# Patient Record
Sex: Female | Born: 1960
Health system: Southern US, Community
[De-identification: ages and names within clinical notes are randomized; demographics above are authoritative.]

## PROBLEM LIST (undated history)

## (undated) ENCOUNTER — Emergency Department (HOSPITAL_COMMUNITY): Payer: BC Managed Care – PPO | Source: Home / Self Care

## (undated) ENCOUNTER — Ambulatory Visit

## (undated) DIAGNOSIS — M797 Fibromyalgia: Secondary | ICD-10-CM

## (undated) DIAGNOSIS — M25569 Pain in unspecified knee: Secondary | ICD-10-CM

## (undated) DIAGNOSIS — K469 Unspecified abdominal hernia without obstruction or gangrene: Secondary | ICD-10-CM

## (undated) DIAGNOSIS — Z95828 Presence of other vascular implants and grafts: Secondary | ICD-10-CM

## (undated) DIAGNOSIS — R5383 Other fatigue: Secondary | ICD-10-CM

## (undated) DIAGNOSIS — K5900 Constipation, unspecified: Secondary | ICD-10-CM

## (undated) DIAGNOSIS — I82409 Acute embolism and thrombosis of unspecified deep veins of unspecified lower extremity: Secondary | ICD-10-CM

## (undated) DIAGNOSIS — R42 Dizziness and giddiness: Secondary | ICD-10-CM

## (undated) DIAGNOSIS — R5381 Other malaise: Secondary | ICD-10-CM

## (undated) DIAGNOSIS — R071 Chest pain on breathing: Secondary | ICD-10-CM

## (undated) DIAGNOSIS — F29 Unspecified psychosis not due to a substance or known physiological condition: Secondary | ICD-10-CM

## (undated) DIAGNOSIS — F411 Generalized anxiety disorder: Secondary | ICD-10-CM

## (undated) DIAGNOSIS — R110 Nausea: Secondary | ICD-10-CM

## (undated) DIAGNOSIS — IMO0002 Reserved for concepts with insufficient information to code with codable children: Secondary | ICD-10-CM

## (undated) DIAGNOSIS — G43019 Migraine without aura, intractable, without status migrainosus: Secondary | ICD-10-CM

## (undated) HISTORY — PX: ENDOMETRIAL ABLATION: SHX621

## (undated) HISTORY — DX: Generalized anxiety disorder: F41.1

## (undated) HISTORY — DX: Presence of other vascular implants and grafts: Z95.828

## (undated) HISTORY — DX: Unspecified abdominal hernia without obstruction or gangrene: K46.9

## (undated) HISTORY — DX: Unspecified psychosis not due to a substance or known physiological condition: F29

## (undated) HISTORY — DX: Pain in unspecified knee: M25.569

## (undated) HISTORY — PX: HERNIA REPAIR: SHX51

## (undated) HISTORY — DX: Reserved for concepts with insufficient information to code with codable children: IMO0002

## (undated) HISTORY — DX: Nausea: R11.0

## (undated) HISTORY — DX: Migraine without aura, intractable, without status migrainosus: G43.019

## (undated) HISTORY — DX: Chest pain on breathing: R07.1

## (undated) HISTORY — DX: Other malaise: R53.81

## (undated) HISTORY — DX: Other fatigue: R53.83

---

## 1999-01-18 ENCOUNTER — Encounter: Payer: Self-pay | Admitting: Family Medicine

## 1999-01-18 ENCOUNTER — Ambulatory Visit (HOSPITAL_COMMUNITY): Admission: RE | Admit: 1999-01-18 | Discharge: 1999-01-18 | Payer: Self-pay | Admitting: Family Medicine

## 1999-02-06 ENCOUNTER — Emergency Department (HOSPITAL_COMMUNITY): Admission: EM | Admit: 1999-02-06 | Discharge: 1999-02-06 | Payer: Self-pay | Admitting: Emergency Medicine

## 2002-06-14 ENCOUNTER — Encounter: Payer: Self-pay | Admitting: Obstetrics & Gynecology

## 2002-06-14 ENCOUNTER — Ambulatory Visit (HOSPITAL_COMMUNITY): Admission: RE | Admit: 2002-06-14 | Discharge: 2002-06-14 | Payer: Self-pay | Admitting: Obstetrics & Gynecology

## 2002-06-30 ENCOUNTER — Ambulatory Visit (HOSPITAL_COMMUNITY): Admission: RE | Admit: 2002-06-30 | Discharge: 2002-06-30 | Payer: Self-pay | Admitting: Obstetrics & Gynecology

## 2002-06-30 ENCOUNTER — Encounter (INDEPENDENT_AMBULATORY_CARE_PROVIDER_SITE_OTHER): Payer: Self-pay

## 2002-06-30 ENCOUNTER — Encounter: Payer: Self-pay | Admitting: Obstetrics & Gynecology

## 2003-03-20 ENCOUNTER — Emergency Department (HOSPITAL_COMMUNITY): Admission: EM | Admit: 2003-03-20 | Discharge: 2003-03-20 | Payer: Self-pay | Admitting: Emergency Medicine

## 2003-06-09 ENCOUNTER — Ambulatory Visit (HOSPITAL_COMMUNITY): Admission: RE | Admit: 2003-06-09 | Discharge: 2003-06-09 | Payer: Self-pay | Admitting: Obstetrics & Gynecology

## 2003-09-17 ENCOUNTER — Emergency Department (HOSPITAL_COMMUNITY): Admission: EM | Admit: 2003-09-17 | Discharge: 2003-09-17 | Payer: Self-pay | Admitting: Emergency Medicine

## 2003-12-07 ENCOUNTER — Emergency Department (HOSPITAL_COMMUNITY): Admission: EM | Admit: 2003-12-07 | Discharge: 2003-12-07 | Payer: Self-pay | Admitting: *Deleted

## 2004-04-22 ENCOUNTER — Ambulatory Visit: Payer: Self-pay | Admitting: Internal Medicine

## 2004-05-03 ENCOUNTER — Ambulatory Visit: Payer: Self-pay | Admitting: Internal Medicine

## 2004-07-17 ENCOUNTER — Other Ambulatory Visit: Admission: RE | Admit: 2004-07-17 | Discharge: 2004-07-17 | Payer: Self-pay | Admitting: Obstetrics and Gynecology

## 2004-08-31 ENCOUNTER — Emergency Department (HOSPITAL_COMMUNITY): Admission: EM | Admit: 2004-08-31 | Discharge: 2004-08-31 | Payer: Self-pay | Admitting: Family Medicine

## 2005-01-06 ENCOUNTER — Emergency Department (HOSPITAL_COMMUNITY): Admission: EM | Admit: 2005-01-06 | Discharge: 2005-01-06 | Payer: Self-pay | Admitting: *Deleted

## 2005-02-14 ENCOUNTER — Ambulatory Visit (HOSPITAL_COMMUNITY): Admission: RE | Admit: 2005-02-14 | Discharge: 2005-02-14 | Payer: Self-pay | Admitting: Obstetrics and Gynecology

## 2005-02-28 ENCOUNTER — Ambulatory Visit: Payer: Self-pay | Admitting: Internal Medicine

## 2005-03-17 ENCOUNTER — Ambulatory Visit: Payer: Self-pay | Admitting: Internal Medicine

## 2005-03-22 ENCOUNTER — Emergency Department (HOSPITAL_COMMUNITY): Admission: EM | Admit: 2005-03-22 | Discharge: 2005-03-22 | Payer: Self-pay | Admitting: Emergency Medicine

## 2005-03-24 ENCOUNTER — Ambulatory Visit: Payer: Self-pay | Admitting: Internal Medicine

## 2005-03-25 ENCOUNTER — Ambulatory Visit: Payer: Self-pay | Admitting: Internal Medicine

## 2005-04-01 ENCOUNTER — Ambulatory Visit: Payer: Self-pay

## 2005-06-10 ENCOUNTER — Emergency Department (HOSPITAL_COMMUNITY): Admission: EM | Admit: 2005-06-10 | Discharge: 2005-06-10 | Payer: Self-pay | Admitting: Emergency Medicine

## 2005-06-29 ENCOUNTER — Emergency Department (HOSPITAL_COMMUNITY): Admission: EM | Admit: 2005-06-29 | Discharge: 2005-06-29 | Payer: Self-pay | Admitting: Family Medicine

## 2005-09-23 ENCOUNTER — Emergency Department (HOSPITAL_COMMUNITY): Admission: EM | Admit: 2005-09-23 | Discharge: 2005-09-23 | Payer: Self-pay | Admitting: Emergency Medicine

## 2006-01-14 IMAGING — CR DG CHEST 1V PORT
1 series · 1 of 1 positions shown · non-contrast
Comparison: None.

CLINICAL DATA: 44-year-old with chest pain and arm pain.
 PORTABLE CHEST ? 1 VIEW:

[view not recorded]
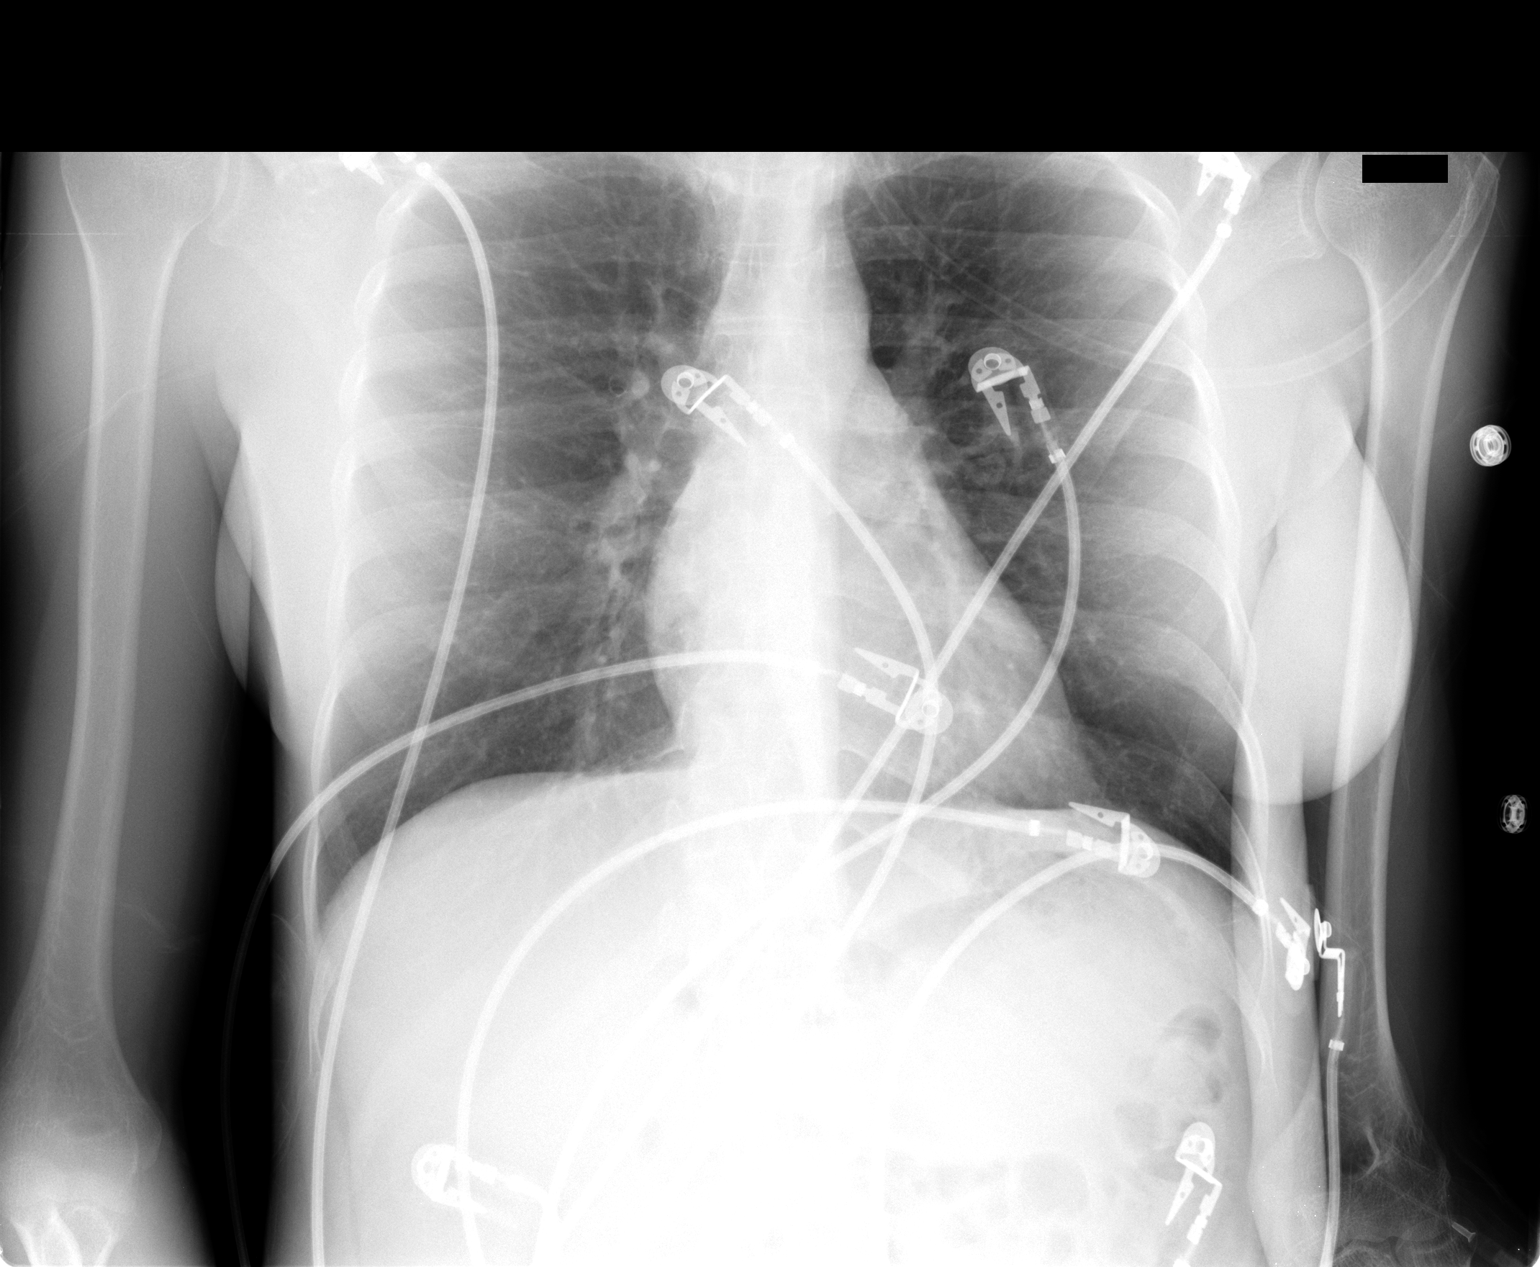

[1 of 1 positions shown; findings below may reference images not displayed]

FINDINGS: The heart size and mediastinal contours are within normal limits.  Both lungs are clear.  Note is made of an inferior vena cava filter overlying the upper abdomen.
IMPRESSION: No acute disease.

## 2006-03-31 ENCOUNTER — Ambulatory Visit: Payer: Self-pay | Admitting: Internal Medicine

## 2006-04-20 ENCOUNTER — Emergency Department (HOSPITAL_COMMUNITY): Admission: EM | Admit: 2006-04-20 | Discharge: 2006-04-20 | Payer: Self-pay | Admitting: Emergency Medicine

## 2006-04-20 ENCOUNTER — Ambulatory Visit: Payer: Self-pay | Admitting: Internal Medicine

## 2006-07-25 ENCOUNTER — Emergency Department (HOSPITAL_COMMUNITY): Admission: EM | Admit: 2006-07-25 | Discharge: 2006-07-25 | Payer: Self-pay | Admitting: *Deleted

## 2006-07-25 ENCOUNTER — Ambulatory Visit: Payer: Self-pay | Admitting: Family Medicine

## 2007-05-24 ENCOUNTER — Emergency Department (HOSPITAL_COMMUNITY): Admission: EM | Admit: 2007-05-24 | Discharge: 2007-05-24 | Payer: Self-pay | Admitting: Family Medicine

## 2007-12-02 ENCOUNTER — Encounter: Payer: Self-pay | Admitting: Internal Medicine

## 2007-12-07 ENCOUNTER — Emergency Department (HOSPITAL_COMMUNITY): Admission: EM | Admit: 2007-12-07 | Discharge: 2007-12-07 | Payer: Self-pay | Admitting: Emergency Medicine

## 2008-04-28 ENCOUNTER — Emergency Department (HOSPITAL_COMMUNITY): Admission: EM | Admit: 2008-04-28 | Discharge: 2008-04-28 | Payer: Self-pay | Admitting: Internal Medicine

## 2009-03-28 ENCOUNTER — Ambulatory Visit: Payer: Self-pay | Admitting: Internal Medicine

## 2009-03-28 DIAGNOSIS — R5381 Other malaise: Secondary | ICD-10-CM

## 2009-03-28 DIAGNOSIS — R5383 Other fatigue: Secondary | ICD-10-CM

## 2009-03-28 DIAGNOSIS — G43019 Migraine without aura, intractable, without status migrainosus: Secondary | ICD-10-CM

## 2009-03-28 DIAGNOSIS — R110 Nausea: Secondary | ICD-10-CM | POA: Insufficient documentation

## 2009-03-28 HISTORY — DX: Other malaise: R53.81

## 2009-03-28 HISTORY — DX: Migraine without aura, intractable, without status migrainosus: G43.019

## 2009-03-28 HISTORY — DX: Other fatigue: R53.83

## 2009-03-28 HISTORY — DX: Nausea: R11.0

## 2009-03-29 ENCOUNTER — Encounter: Admission: RE | Admit: 2009-03-29 | Discharge: 2009-03-29 | Payer: Self-pay | Admitting: Internal Medicine

## 2009-04-02 LAB — CONVERTED CEMR LAB
ALT: 93 units/L — ABNORMAL HIGH (ref 0–35)
AST: 41 units/L — ABNORMAL HIGH (ref 0–37)
Albumin: 4.6 g/dL (ref 3.5–5.2)
Basophils Absolute: 0.1 10*3/uL (ref 0.0–0.1)
Bilirubin Urine: NEGATIVE
CO2: 24 meq/L (ref 19–32)
Chloride: 106 meq/L (ref 96–112)
GFR calc non Af Amer: 137.92 mL/min (ref >60)
Glucose, Bld: 85 mg/dL (ref 70–99)
HCT: 39.6 % (ref 36.0–46.0)
Hemoglobin: 13.5 g/dL (ref 12.0–15.0)
Ketones, ur: NEGATIVE mg/dL
Lymphs Abs: 1.7 10*3/uL (ref 0.7–4.0)
Monocytes Relative: 6.8 % (ref 3.0–12.0)
Neutro Abs: 2 10*3/uL (ref 1.4–7.7)
Potassium: 4.2 meq/L (ref 3.5–5.1)
RDW: 12.9 % (ref 11.5–14.6)
Sed Rate: 13 mm/hr (ref 0–22)
Sodium: 146 meq/L — ABNORMAL HIGH (ref 135–145)
Urine Glucose: NEGATIVE mg/dL
Urobilinogen, UA: 0.2 (ref 0.0–1.0)

## 2009-04-13 ENCOUNTER — Ambulatory Visit: Payer: Self-pay | Admitting: Internal Medicine

## 2009-08-06 ENCOUNTER — Emergency Department (HOSPITAL_COMMUNITY): Admission: EM | Admit: 2009-08-06 | Discharge: 2009-08-06 | Payer: Self-pay | Admitting: Emergency Medicine

## 2009-08-17 ENCOUNTER — Encounter: Admission: RE | Admit: 2009-08-17 | Discharge: 2009-08-17 | Payer: Self-pay | Admitting: Orthopedic Surgery

## 2009-11-16 ENCOUNTER — Ambulatory Visit: Payer: Self-pay | Admitting: Internal Medicine

## 2009-11-16 ENCOUNTER — Telehealth: Payer: Self-pay | Admitting: Internal Medicine

## 2009-11-16 DIAGNOSIS — F29 Unspecified psychosis not due to a substance or known physiological condition: Secondary | ICD-10-CM | POA: Insufficient documentation

## 2009-11-16 DIAGNOSIS — F411 Generalized anxiety disorder: Secondary | ICD-10-CM

## 2009-11-16 HISTORY — DX: Generalized anxiety disorder: F41.1

## 2009-11-16 HISTORY — DX: Unspecified psychosis not due to a substance or known physiological condition: F29

## 2009-11-16 LAB — CONVERTED CEMR LAB
Amphetamine Screen, Ur: NEGATIVE
Barbiturate Quant, Ur: NEGATIVE
Benzodiazepines.: NEGATIVE
Creatinine,U: 57.3 mg/dL
Methadone: NEGATIVE
Propoxyphene: NEGATIVE

## 2009-11-19 LAB — CONVERTED CEMR LAB
ALT: 23 units/L (ref 0–35)
BUN: 16 mg/dL (ref 6–23)
Basophils Relative: 0.6 % (ref 0.0–3.0)
CO2: 28 meq/L (ref 19–32)
Chloride: 111 meq/L (ref 96–112)
Creatinine, Ser: 0.7 mg/dL (ref 0.4–1.2)
Eosinophils Absolute: 0 10*3/uL (ref 0.0–0.7)
Eosinophils Relative: 0.7 % (ref 0.0–5.0)
Glucose, Bld: 94 mg/dL (ref 70–99)
Hemoglobin, Urine: NEGATIVE
Leukocytes, UA: NEGATIVE
Lymphocytes Relative: 35.7 % (ref 12.0–46.0)
MCHC: 33.9 g/dL (ref 30.0–36.0)
Neutrophils Relative %: 55.5 % (ref 43.0–77.0)
Nitrite: NEGATIVE
Platelets: 290 10*3/uL (ref 150.0–400.0)
RBC: 4.49 M/uL (ref 3.87–5.11)
TSH: 0.85 microintl units/mL (ref 0.35–5.50)
Total Protein, Urine: NEGATIVE mg/dL
Total Protein: 7.6 g/dL (ref 6.0–8.3)
WBC: 5.2 10*3/uL (ref 4.5–10.5)

## 2009-12-03 ENCOUNTER — Ambulatory Visit: Payer: Self-pay | Admitting: Internal Medicine

## 2010-02-04 ENCOUNTER — Ambulatory Visit: Payer: Self-pay | Admitting: Internal Medicine

## 2010-02-04 ENCOUNTER — Telehealth: Payer: Self-pay | Admitting: Internal Medicine

## 2010-02-04 DIAGNOSIS — M25569 Pain in unspecified knee: Secondary | ICD-10-CM

## 2010-02-04 HISTORY — DX: Pain in unspecified knee: M25.569

## 2010-04-12 ENCOUNTER — Telehealth: Payer: Self-pay | Admitting: Internal Medicine

## 2010-04-16 ENCOUNTER — Encounter: Payer: Self-pay | Admitting: Internal Medicine

## 2010-04-16 ENCOUNTER — Ambulatory Visit: Payer: Self-pay | Admitting: Internal Medicine

## 2010-04-16 DIAGNOSIS — R071 Chest pain on breathing: Secondary | ICD-10-CM | POA: Insufficient documentation

## 2010-04-16 HISTORY — DX: Chest pain on breathing: R07.1

## 2010-06-11 NOTE — Progress Notes (Signed)
Summary: Med SE?  Phone Note Call from Patient Call back at Home Phone [phone]   Caller: Mom Summary of Call: Pt's mother called stating that pt had a severe HA yesterday evening. Pt took someone's migraine medication (mother is unsure of medication) and became very confused and disoriented. Per Mom, pt drove around last night for 3 hours trying to get home. Mom states that this morning pt is still disoriented, not knowing where she is. Pt's mother was advised that [facility] did not have an appt available until this afternoon and there were no other available appointment with any other MD. Pt was scheduled for 4p today but after declining to take pt to ER or UC, pt's mother is requesting work-in appt.  After speaking with [facility], I was advised that pt can be worked in this morning but she may have a wait. Pt's appointment was moved to 11:15a per AVP and Sarah. I spoke with pt and advised that she may have a wait which she acknowledged. Initial call taken by: Margaret Pyle, CMA,  November 16, 2009 9:22 AM

## 2010-06-11 NOTE — Assessment & Plan Note (Signed)
Summary: KNEE SWELLING/ HEADACHES/NWS  #   Vital Signs:  Patient profile:   50 year old female Height:      62 inches Weight:      122 pounds BMI:     22.39 Temp:     97.8 degrees F oral Pulse rate:   76 / minute Pulse rhythm:   regular Resp:     16 per minute BP sitting:   104 / 60  (left arm) Cuff size:   regular  Vitals Entered By: Lanier Prude, Beverly Gust) (February 04, 2010 9:41 AM)  Procedure Note  Injections: The patient complains of pain and swelling. Duration of symptoms: 5 days Indication: acute pain Consent signed: yes  Procedure # 1: joint aspiration & injection    Region: anterior    Location: L knee    Technique: 18 g needle    Medication: 20 mg depomedrol    Anesthesia: 1.0 ml 1% lidocaine w/o epinephrine    Comment: Risks including but not limited by incomplete procedure, bleeding, infection, recurrence were discussed with the patient. Consent form was signed. 9 cc of sero-sanduinous fluid aspirated.  Tolerated well. Complicatons - none. Good pain relief following the procedure.   Cleaned and prepped with: alcohol and betadine Wound dressing: bandaid, bulky gauze dressing, and ace wrap Instructions: elevate  CC: migraines, Lt knee pain Is Patient Diabetic? No Comments pt states she injured knee when she slipped on a wheelchair ramp and fell.  Swelling has reduced but she states it feels "like it has fluid on it"   CC:  migraines and Lt knee pain.  History of Present Illness: C/o HAs C/o L knee pain and swelling after a fall 5 d ago  Current Medications (verified): 1)  Promethazine Hcl 25 Mg  Tabs (Promethazine Hcl) .Marland Kitchen.. 1 By Mouth Qid As Needed Nausea 2)  Ibuprofen 600 Mg  Tabs (Ibuprofen) .Marland Kitchen.. 1 By Mouth Two Times A Day As Needed Pain 3)  Prednisone 10 Mg Tabs (Prednisone) .... 4 Tabs On D#1, 2 Tabs On D#2 and 1 Tab On D#3 As Needed Bad Migraine 4)  Vitamin D3 1000 Unit  Tabs (Cholecalciferol) .Marland Kitchen.. 1 By Mouth Daily 5)  Hydrocodone-Acetaminophen  5-325 Mg Tabs (Hydrocodone-Acetaminophen) .Marland Kitchen.. 1 or 1/2  By Mouth Up To 4 Times Per Day As Needed For Pain 6)  Lorazepam 0.5 Mg Tabs (Lorazepam) .Marland Kitchen.. 1 By Mouth Two Times A Day As Needed Anxiety 7)  Zomig Zmt 5 Mg Tbdp (Zolmitriptan) .Marland Kitchen.. 1 By Mouth Once Daily As Needed Migraine  Allergies (verified): 1)  Topamax (Topiramate)  Past History:  Past Medical History: Last updated: 03/28/2009 Migraines Hernia Pregnancy phlebitis   Social History: Last updated: 03/28/2009 Occupation: unempl saleseperson Divorced Never Smoked Alcohol use-no Drug use-no Regular exercise-no  Review of Systems       The patient complains of difficulty walking.  The patient denies fever, dyspnea on exertion, abdominal pain, and depression.    Physical Exam  General:  NAD Mouth:  Oral mucosa and oropharynx without lesions or exudates.  Teeth in good repair. Lungs:  Normal respiratory effort, chest expands symmetrically. Lungs are clear to auscultation, no crackles or wheezes. Heart:  Normal rate and regular rhythm. S1 and S2 normal without gallop, murmur, click, rub or other extra sounds. Abdomen:  Bowel sounds positive,abdomen soft and non-tender without masses, organomegaly or hernias noted. Msk:  L knee with 3x3x1 cm swelling over knee cap Neurologic:  No cranial nerve deficits noted. Station and gait are normal. Plantar  reflexes are down-going bilaterally. DTRs are symmetrical throughout. Sensory nl, motor and coordinative functions appear a little off. Skin:  Intact without suspicious lesions or rashes Psych:  Oriented X2.   Slurred speech a little   Impression & Recommendations:  Problem # 1:  KNEE PAIN (ICD-719.46) L suprapatella bursitis, posttraumatic Assessment New  Her updated medication list for this problem includes:    Ibuprofen 600 Mg Tabs (Ibuprofen) .Marland Kitchen... 1 by mouth two times a day as needed pain    Hydrocodone-acetaminophen 5-325 Mg Tabs (Hydrocodone-acetaminophen) .Marland Kitchen... 1 or 1/2   by mouth up to 4 times per day as needed for pain     Orders: Ace Wraps 3-5 in/yard  (U0454) Joint Aspirate / Injection, Large (20610) Depo-Medrol 20mg  (J1020)  Problem # 2:  MIGRAINE, COMMON W/INTRACTABLE MIGRAINE (ICD-346.11) Assessment: Unchanged  Her updated medication list for this problem includes:    Ibuprofen 600 Mg Tabs (Ibuprofen) .Marland Kitchen... 1 by mouth two times a day as needed pain    Hydrocodone-acetaminophen 5-325 Mg Tabs (Hydrocodone-acetaminophen) .Marland Kitchen... 1 or 1/2  by mouth up to 4 times per day as needed for pain    Zomig Zmt 5 Mg Tbdp (Zolmitriptan) .Marland Kitchen... 1 by mouth once daily as needed migraine    Fioricet 50-325-40 Mg Tabs (Butalbital-apap-caffeine) .Marland Kitchen... 1-2 by mouth two times a day as needed migraine or tension ha  Complete Medication List: 1)  Promethazine Hcl 25 Mg Tabs (Promethazine hcl) .Marland Kitchen.. 1 by mouth qid as needed nausea 2)  Ibuprofen 600 Mg Tabs (Ibuprofen) .Marland Kitchen.. 1 by mouth two times a day as needed pain 3)  Prednisone 10 Mg Tabs (Prednisone) .... 4 tabs on d#1, 2 tabs on d#2 and 1 tab on d#3 as needed bad migraine 4)  Vitamin D3 1000 Unit Tabs (Cholecalciferol) .Marland Kitchen.. 1 by mouth daily 5)  Hydrocodone-acetaminophen 5-325 Mg Tabs (Hydrocodone-acetaminophen) .Marland Kitchen.. 1 or 1/2  by mouth up to 4 times per day as needed for pain 6)  Lorazepam 0.5 Mg Tabs (Lorazepam) .Marland Kitchen.. 1 by mouth two times a day as needed anxiety 7)  Zomig Zmt 5 Mg Tbdp (Zolmitriptan) .Marland Kitchen.. 1 by mouth once daily as needed migraine 8)  Fioricet 50-325-40 Mg Tabs (Butalbital-apap-caffeine) .Marland Kitchen.. 1-2 by mouth two times a day as needed migraine or tension ha   Patient Instructions: 1)  Please schedule a follow-up appointment in 1 month. Prescriptions: FIORICET 50-325-40 MG TABS (BUTALBITAL-APAP-CAFFEINE) 1-2 by mouth two times a day as needed migraine or tension HA  #60 x 1   Entered and Authorized by:   Tresa Garter MD   Signed by:   Tresa Garter MD on 02/04/2010   Method used:   Print then Give  to Patient   RxID:   [id number]

## 2010-06-11 NOTE — Miscellaneous (Signed)
Summary: Special Procedure  Special Procedure   Imported By: Lester  02/06/2010 08:59:12  _____________________________________________________________________  External Attachment:    Type:   Image     Comment:   External Document

## 2010-06-11 NOTE — Assessment & Plan Note (Signed)
Summary: LITTLE DISILLUSIONAL--HEADACHE--STC   Vital Signs:  Patient profile:   50 year old female Height:      62 inches (157.48 cm) Weight:      120 pounds (54.55 kg) BMI:     22.03 Temp:     97.9 degrees F (36.61 degrees C) oral Pulse rate:   76 / minute Pulse rhythm:   regular Resp:     16 per minute BP sitting:   100 / 70  (left arm) Cuff size:   regular  Vitals Entered By: Lanier Prude, CMA(AAMA) (November 16, 2009 11:18 AM) CC: Dizziness, confusion Is Patient Diabetic? No Comments pt had Migraine HA yest. took Topamax at 7pm. Symptoms began then. HA improved today   CC:  Dizziness and confusion.  History of Present Illness: C/o bad migraine HA x 1 wk; resolved  She took Topamax yesturday at lunch (someone gave it to her) - in 30 min developed dizziness, slurred speech, confused; she was lost in town - it took her 6 hrs to get home. Police pulled her over. C/o being jittery and tearful this am. No HA today...  Current Medications (verified): 1)  Maxalt-Mlt 10 Mg Tbdp (Rizatriptan Benzoate) .Marland Kitchen.. 1 By Mouth Once Daily As Needed Headache 2)  Promethazine Hcl 25 Mg  Tabs (Promethazine Hcl) .Marland Kitchen.. 1 By Mouth Qid As Needed Nausea 3)  Ibuprofen 600 Mg  Tabs (Ibuprofen) .Marland Kitchen.. 1 By Mouth Two Times A Day As Needed Pain 4)  Prednisone 10 Mg Tabs (Prednisone) .... 4 Tabs On D#1, 2 Tabs On D#2 and 1 Tab On D#3 As Needed Bad Migraine 5)  Vitamin D3 1000 Unit  Tabs (Cholecalciferol) .Marland Kitchen.. 1 By Mouth Daily 6)  Hydrocodone-Acetaminophen 5-325 Mg Tabs (Hydrocodone-Acetaminophen) .Marland Kitchen.. 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  The patient denies fever, weight loss, dyspnea on exertion, prolonged cough, and abdominal pain.    Physical Exam  General:  NAD Eyes:  No corneal or conjunctival inflammation noted. EOMI. Perrla.  Nose:  External nasal examination shows no deformity or inflammation. Nasal mucosa are pink and moist without lesions or exudates. Neck:  No deformities, masses, or tenderness noted. Lungs:  Normal respiratory effort, chest expands symmetrically. Lungs are clear to auscultation, no crackles or wheezes. Heart:  Normal rate and regular rhythm. S1 and S2 normal without gallop, murmur, click, rub or other extra sounds. Abdomen:  Bowel sounds positive,abdomen soft and non-tender without masses, organomegaly or hernias noted. Msk:  WNL Neurologic:  No cranial nerve deficits noted. Station and gait are normal. Plantar reflexes are down-going bilaterally. DTRs are symmetrical throughout. Sensory nl, motor and coordinative functions appear a little off. Skin:  Intact without suspicious lesions or rashes Psych:  Oriented X2.   Slurred speech a little   Impression & Recommendations:  Problem # 1:  MENTAL CONFUSION (ICD-298.9) due to injestion of  100 mg Topamax supposedly vs other Assessment New Discussed the issueu -  she should never do it again. Th effects of the drug should wear out: she is feeling better. Drink more fluids. Rest. Mom will stay w/her. No driving untill well... Orders: TLB-B12, Serum-Total ONLY (16109-U04) TLB-BMP (Basic Metabolic Panel-BMET) (80048-METABOL) TLB-Hepatic/Liver Function Pnl (80076-HEPATIC) TLB-CBC Platelet - w/Differential (85025-CBCD) TLB-TSH (Thyroid Stimulating Hormone) (84443-TSH) TLB-Udip  ONLY (81003-UDIP) T-Drug Screen-Urine, (single) [phone])  Problem # 2:  NAUSEA ALONE (ICD-787.02) Assessment: New  Orders: TLB-B12, Serum-Total ONLY (78295-A21) TLB-BMP (Basic Metabolic Panel-BMET) (80048-METABOL) TLB-Hepatic/Liver Function Pnl (80076-HEPATIC) TLB-CBC Platelet - w/Differential (85025-CBCD) TLB-TSH (Thyroid Stimulating Hormone) (84443-TSH) TLB-Udip ONLY (81003-UDIP) T-Drug Screen-Urine, (single) (30865-78469)  Problem # 3:  FATIGUE (ICD-780.79) Assessment: New  Orders: TLB-B12, Serum-Total ONLY (62952-W41) TLB-BMP (Basic Metabolic Panel-BMET) (80048-METABOL) TLB-Hepatic/Liver Function Pnl (80076-HEPATIC) TLB-CBC Platelet - w/Differential (85025-CBCD) TLB-TSH (Thyroid Stimulating Hormone) (84443-TSH) TLB-Udip ONLY (81003-UDIP) T-Drug Screen-Urine, (single) (32440-10272)  Problem # 4:  MIGRAINE, COMMON W/INTRACTABLE MIGRAINE (ICD-346.11) Assessment: Deteriorated For future bad migraines: The following medications were removed from the medication list:    Maxalt-mlt 10 Mg Tbdp (Rizatriptan benzoate) .Marland Kitchen... 1 by mouth once daily as needed headache Her updated medication list for this problem includes:    Ibuprofen 600 Mg Tabs (Ibuprofen) .Marland Kitchen... 1 by mouth two times a day as needed pain    Hydrocodone-acetaminophen 5-325 Mg Tabs (Hydrocodone-acetaminophen) .Marland Kitchen... 1 or 1/2  by mouth up to 4 times per day as needed for pain    Zomig Zmt 5 Mg Tbdp (Zolmitriptan) .Marland Kitchen... 1 by mouth once daily as needed migraine  Problem # 5:  ANXIETY (ICD-300.00) due to #1 Assessment: New  Her updated medication list for this problem includes:    Lorazepam 0.5 Mg Tabs (Lorazepam) .Marland Kitchen... 1 by mouth two times a day as needed anxiety if bad  Complete Medication List: 1)  Promethazine Hcl 25 Mg Tabs (Promethazine hcl) .Marland Kitchen.. 1 by mouth qid as needed nausea 2)  Ibuprofen 600 Mg Tabs (Ibuprofen) .Marland Kitchen.. 1 by mouth two times a day as needed pain 3)  Prednisone 10 Mg Tabs (Prednisone) .... 4  tabs on d#1, 2 tabs on d#2 and 1 tab on d#3 as needed bad migraine 4)  Vitamin D3 1000 Unit Tabs (Cholecalciferol) .Marland Kitchen.. 1 by mouth daily 5)  Hydrocodone-acetaminophen 5-325 Mg Tabs (Hydrocodone-acetaminophen) .Marland Kitchen.. 1 or 1/2  by mouth up to 4 times per day as needed for pain 6)  Lorazepam 0.5 Mg Tabs (Lorazepam) .Marland Kitchen.. 1 by mouth two times a day as needed anxiety 7)  Zomig Zmt 5 Mg Tbdp (Zolmitriptan) .Marland Kitchen.. 1 by mouth once daily as needed migraine  Patient Instructions: 1)  Please schedule a follow-up appointment in 2 weeks. 2)  Call if you are not better in a reasonable amount of time or if worse. Go to ER if feeling really bad!  Prescriptions: ZOMIG ZMT 5 MG TBDP (ZOLMITRIPTAN) 1 by mouth once daily as needed migraine  #12 x 6   Entered and Authorized by:   Tresa Garter MD   Signed by:   Tresa Garter MD on 11/16/2009   Method used:   Print then Give to Patient   RxID:   [id number] HYDROCODONE-ACETAMINOPHEN 5-325 MG TABS (HYDROCODONE-ACETAMINOPHEN) 1 or 1/2  by mouth up to 4 times per day as needed for pain  #30 x 0   Entered and Authorized by:   Tresa Garter MD   Signed by:   Tresa Garter MD on 11/16/2009   Method used:   Print then Give to Patient   RxID:  [id number] PREDNISONE 10 MG TABS (PREDNISONE) 4 tabs on D#1, 2 tabs on D#2 and 1 tab on D#3 as needed bad migraine  #35 x 0   Entered and Authorized by:   Tresa Garter MD   Signed by:   Tresa Garter MD on 11/16/2009   Method used:   Print then Give to Patient   RxID:   [id number] IBUPROFEN 600 MG  TABS (IBUPROFEN) 1 by mouth two times a day as needed pain  #60 x 3   Entered and Authorized by:   Tresa Garter MD   Signed by:   Tresa Garter MD on 11/16/2009   Method used:   Print then Give to Patient   RxID:   [id number] PROMETHAZINE HCL 25 MG  TABS (PROMETHAZINE HCL) 1 by mouth qid as needed nausea  #60 x 3   Entered and Authorized by:   Tresa Garter  MD   Signed by:   Tresa Garter MD on 11/16/2009   Method used:   Print then Give to Patient   RxID:   [id number] LORAZEPAM 0.5 MG TABS (LORAZEPAM) 1 by mouth two times a day as needed anxiety  #20 x 3   Entered and Authorized by:   Tresa Garter MD   Signed by:   Tresa Garter MD on 11/16/2009   Method used:   Print then Give to Patient   RxID:   [id number]

## 2010-06-11 NOTE — Assessment & Plan Note (Signed)
Summary: FU [facility]  #   Vital Signs:  Patient profile:   50 year old female Height:      62 inches Weight:      125 pounds BMI:     22.95 Temp:     98.8 degrees F oral Pulse rate:   88 / minute Pulse rhythm:   regular Resp:     16 per minute BP sitting:   100 / 62  (left arm) Cuff size:   regular  Vitals Entered By: Lanier Prude, Beverly Gust) (April 16, 2010 2:39 PM) CC: cyst on center of chest X 3 days and HA Is Patient Diabetic? No   CC:  cyst on center of chest X 3 days and HA.  History of Present Illness: C/o painful cyst on chest - on the breast bone  tip  x 1 wk  Current Medications (verified): 1)  Promethazine Hcl 25 Mg  Tabs (Promethazine Hcl) .Marland Kitchen.. 1 By Mouth Qid As Needed Nausea 2)  Ibuprofen 600 Mg  Tabs (Ibuprofen) .Marland Kitchen.. 1 By Mouth Two Times A Day As Needed Pain 3)  Prednisone 10 Mg Tabs (Prednisone) .... 4 Tabs On D#1, 2 Tabs On D#2 and 1 Tab On D#3 As Needed Bad Migraine 4)  Vitamin D3 1000 Unit  Tabs (Cholecalciferol) .Marland Kitchen.. 1 By Mouth Daily 5)  Hydrocodone-Acetaminophen 5-325 Mg Tabs (Hydrocodone-Acetaminophen) .Marland Kitchen.. 1 or 1/2  By Mouth Up To 4 Times Per Day As Needed For Pain 6)  Lorazepam 0.5 Mg Tabs (Lorazepam) .Marland Kitchen.. 1 By Mouth Two Times A Day As Needed Anxiety 7)  Zomig Zmt 5 Mg Tbdp (Zolmitriptan) .Marland Kitchen.. 1 By Mouth Once Daily As Needed Migraine 8)  Fioricet 50-325-40 Mg Tabs (Butalbital-Apap-Caffeine) .Marland Kitchen.. 1-2 By Mouth Two Times A Day As Needed Migraine or Tension Ha  Allergies (verified): 1)  Topamax (Topiramate)  Physical Exam  General:  NAD Mouth:  Oral mucosa and oropharynx without lesions or exudates.  Teeth in good repair. Neck:  No deformities, masses, or tenderness noted. Chest Wall:  Sternal tip is tender US exam is WNL Lungs:  Normal respiratory effort, chest expands symmetrically. Lungs are clear to auscultation, no crackles or wheezes. Heart:  Normal rate and regular rhythm. S1 and S2 normal without gallop, murmur, click, rub or other extra  sounds.   Impression & Recommendations:  Problem # 1:  CHEST WALL PAIN, ACUTE (ZOX-096.04) Assessment New MSK [place] does not reveal any focal abnormality. She has a 30 lbs 2 yo baby - straihed anter chest wall The office visit took longer than 20 min with US exam for more than 50% of the 20 min   The following medications were removed from the medication list:    Ibuprofen 600 Mg Tabs (Ibuprofen) .Marland Kitchen... 1 by mouth two times a day as needed pain Her updated medication list for this problem includes:    Vimovo 500-20 Mg Tbec (Naproxen-esomeprazole) .Marland Kitchen... 1 by mouth once daily - two times a day pc as needed pain  Problem # 2:  ANXIETY (ICD-300.00) Assessment: Unchanged  Her updated medication list for this problem includes:    Lorazepam 0.5 Mg Tabs (Lorazepam) .Marland Kitchen... 1 by mouth two times a day as needed anxiety  Complete Medication List: 1)  Promethazine Hcl 25 Mg Tabs (Promethazine hcl) .Marland Kitchen.. 1 by mouth qid as needed nausea 2)  Prednisone 10 Mg Tabs (Prednisone) .... 4 tabs on d#1, 2 tabs on d#2 and 1 tab on d#3 as needed bad migraine 3)  Vitamin D3 1000 Unit  Tabs (Cholecalciferol) .Marland Kitchen.. 1 by mouth daily 4)  Hydrocodone-acetaminophen 5-325 Mg Tabs (Hydrocodone-acetaminophen) .Marland Kitchen.. 1 or 1/2  by mouth up to 4 times per day as needed for pain 5)  Lorazepam 0.5 Mg Tabs (Lorazepam) .Marland Kitchen.. 1 by mouth two times a day as needed anxiety 6)  Zomig Zmt 5 Mg Tbdp (Zolmitriptan) .Marland Kitchen.. 1 by mouth once daily as needed migraine 7)  Vimovo 500-20 Mg Tbec (Naproxen-esomeprazole) .Marland Kitchen.. 1 by mouth once daily - two times a day pc as needed pain  Patient Instructions: 1)  Call if you are not better in a reasonable amount of time or if worse.  Prescriptions: VIMOVO 500-20 MG TBEC (NAPROXEN-ESOMEPRAZOLE) 1 by mouth once daily - two times a day pc as needed pain  #60 x 3   Entered and Authorized by:   Tresa Garter MD   Signed by:   Tresa Garter MD on 04/16/2010   Method used:   Print then Give to Patient    RxID:   [id number]    Orders Added: 1)  Est. Patient Level IV [14782]

## 2010-06-11 NOTE — Progress Notes (Signed)
Summary: Knot  Phone Note Call from Patient Call back at [facility] Phone [phone]   Summary of Call: Pt left vm that she found a knot on her chest and wants MD to be aware. Attempted to call pt, # rings then hangs up x 2 - will try later.  Initial call taken by: Lamar Sprinkles, CMA,  April 12, 2010 3:14 PM  Follow-up for Phone Call        Spoke w/pt, she has noticed a knot today on her chest. No pain, swelling or drainage. Also c/o some tightness in the area. Advised eval asap for severe symptoms and also scheduled pt for office visit sat Follow-up by: Lamar Sprinkles, CMA,  April 12, 2010 5:15 PM  Additional Follow-up for Phone Call Additional follow up Details #1::        agree w/ov Thank you!  Additional Follow-up by: Tresa Garter MD,  April 12, 2010 5:41 PM

## 2010-06-11 NOTE — Progress Notes (Signed)
Summary: Fiorinal  Phone Note Call from Patient   Summary of Call: Patient is requesting rx for fiorinal NOT fioricet. OK?  Initial call taken by: Lamar Sprinkles, CMA,  February 04, 2010 2:31 PM  Follow-up for Phone Call        Fioricet is easier on the stomach - it has Tylenol instead of aspirin; otherwise it is the same Follow-up by: Tresa Garter MD,  February 04, 2010 5:19 PM  Additional Follow-up for Phone Call Additional follow up Details #1::        left mess to call office back...................Marland KitchenLamar Sprinkles, CMA  February 04, 2010 5:36 PM   left mess to call office back................Marland KitchenLamar Sprinkles, CMA  February 05, 2010 2:07 PM   Pt informed  Additional Follow-up by: Lamar Sprinkles, CMA,  February 05, 2010 2:10 PM

## 2010-07-09 ENCOUNTER — Telehealth: Payer: Self-pay | Admitting: Internal Medicine

## 2010-07-11 ENCOUNTER — Telehealth (INDEPENDENT_AMBULATORY_CARE_PROVIDER_SITE_OTHER): Payer: Self-pay | Admitting: *Deleted

## 2010-07-18 NOTE — Progress Notes (Signed)
  Phone Note Other Incoming   Request: Send information Summary of Call: Request for records received from [facility]. Request forwarded to [employer]. Plot[place]

## 2010-07-18 NOTE — Progress Notes (Signed)
Summary: UC TODAY  Phone Note Call from Patient   Summary of Call: Pt c/o possible "pink eye" - she agreed to go to UC for eval today  Initial call taken by: Lamar Sprinkles, CMA,  July 09, 2010 10:03 AM  Follow-up for Phone Call        Agree Thank you!  Follow-up by: Tresa Garter MD,  July 09, 2010 1:23 PM

## 2010-07-24 ENCOUNTER — Inpatient Hospital Stay (INDEPENDENT_AMBULATORY_CARE_PROVIDER_SITE_OTHER)
Admission: RE | Admit: 2010-07-24 | Discharge: 2010-07-24 | Disposition: A | Discharge status: Home / Self Care | Payer: BC Managed Care – PPO | Source: Ambulatory Visit | Attending: Family Medicine | Admitting: Family Medicine

## 2010-07-24 DIAGNOSIS — J019 Acute sinusitis, unspecified: Secondary | ICD-10-CM

## 2010-09-03 ENCOUNTER — Emergency Department (HOSPITAL_COMMUNITY)
Admission: EM | Admit: 2010-09-03 | Discharge: 2010-09-03 | Disposition: A | Discharge status: Home / Self Care | Payer: BC Managed Care – PPO | Attending: Emergency Medicine | Admitting: Emergency Medicine

## 2010-09-03 DIAGNOSIS — Z86718 Personal history of other venous thrombosis and embolism: Secondary | ICD-10-CM | POA: Insufficient documentation

## 2010-09-03 DIAGNOSIS — G43909 Migraine, unspecified, not intractable, without status migrainosus: Secondary | ICD-10-CM | POA: Insufficient documentation

## 2010-09-03 DIAGNOSIS — Z79899 Other long term (current) drug therapy: Secondary | ICD-10-CM | POA: Insufficient documentation

## 2010-09-03 DIAGNOSIS — R071 Chest pain on breathing: Secondary | ICD-10-CM | POA: Insufficient documentation

## 2010-09-03 LAB — DIFFERENTIAL
Basophils Absolute: 0 10*3/uL (ref 0.0–0.1)
Basophils Relative: 1 % (ref 0–1)
Eosinophils Absolute: 0.1 10*3/uL (ref 0.0–0.7)
Monocytes Relative: 8 % (ref 3–12)
Neutro Abs: 3.8 10*3/uL (ref 1.7–7.7)
Neutrophils Relative %: 59 % (ref 43–77)

## 2010-09-03 LAB — URINALYSIS, ROUTINE W REFLEX MICROSCOPIC
Bilirubin Urine: NEGATIVE
Nitrite: NEGATIVE
Specific Gravity, Urine: 1.018 (ref 1.005–1.030)
Urobilinogen, UA: 0.2 mg/dL (ref 0.0–1.0)
pH: 6 (ref 5.0–8.0)

## 2010-09-03 LAB — CBC
Hemoglobin: 12.9 g/dL (ref 12.0–15.0)
Platelets: 315 10*3/uL (ref 150–400)
RBC: 4.49 MIL/uL (ref 3.87–5.11)
WBC: 6.4 10*3/uL (ref 4.0–10.5)

## 2010-09-03 LAB — POCT PREGNANCY, URINE: Preg Test, Ur: NEGATIVE

## 2010-09-03 LAB — BASIC METABOLIC PANEL
Chloride: 111 mEq/L (ref 96–112)
GFR calc Af Amer: >60 mL/min (ref >60)
GFR calc non Af Amer: >60 mL/min (ref >60)
Potassium: 4.2 mEq/L (ref 3.5–5.1)
Sodium: 141 mEq/L (ref 135–145)

## 2010-09-03 LAB — URINE MICROSCOPIC-ADD ON

## 2010-09-03 LAB — D-DIMER, QUANTITATIVE: D-Dimer, Quant: <0.22 ug/mL-FEU (ref 0.00–0.48)

## 2010-09-24 ENCOUNTER — Telehealth: Payer: Self-pay | Admitting: *Deleted

## 2010-09-24 NOTE — Telephone Encounter (Signed)
Tried to reach Pt to inquire as to frequency of migraines and amt of medication needed per month [insurance quantity limit: 8 tablets/40mg  per 30 days] Pt's phone message states, "number not receiving calls at this time". Spoke w/pharmacist and explained situation and gave authority to run new Rx for Quantity of: 8 tablets

## 2010-09-27 NOTE — Op Note (Signed)
NAME:  Bailey Patterson, Bailey Patterson                        ACCOUNT NO.:  [id number]   MEDICAL RECORD NO.:  [id number]                   PATIENT TYPE:  AMB   LOCATION:  SDC                                  FACILITY:  [facility]   PHYSICIAN:  Roseanna Rainbow, M.D.         DATE OF BIRTH:  12-26-1960   DATE OF PROCEDURE:  06/30/2002  DATE OF DISCHARGE:                                 OPERATIVE REPORT   PREOPERATIVE DIAGNOSES:  1. Abnormal Pap smear.  2. Dysfunctional uterine bleeding.   POSTOPERATIVE DIAGNOSES:  1. Abnormal Pap smear.  2. Dysfunctional uterine bleeding.   PROCEDURE:  1. Colposcopy with cervical biopsies.  2. Endometrial cryo ablation.   SURGEON:  Roseanna Rainbow, M.D.   ANESTHESIA:  Laryngeal mask airway, paracervical block.   COMPLICATIONS:  None.   ESTIMATED BLOOD LOSS:  Less than 50 mL.   FLUIDS:  As per anesthesiology.   URINE OUTPUT:  As per anesthesiology.   FINDINGS:  On colposcopic examination the examination was felt to be  nonsatisfactory.  There was an area of leukoplakia at 7 o'clock and areas  with coarse punctations and mosaicism.   PROCEDURE:  Risks, benefits, indications, and alternatives of the procedure  were reviewed with the patient and informed consent was obtained.  The  patient was taken to the operating room with an IV running.  The patient was  placed in a dorsal lithotomy position.  A colposcopic examination was then  performed with the findings noted above.  Biopsies were taken at 7 and 10  o'clock using the scalpel.  Silver nitrate was then applied to the biopsy  sites and adequate hemostasis was noted.  At this point the vagina and  cervix were prepped with Betadine.  2 mL of 2% lidocaine were applied to the  anterior lip of the cervix.  The single tooth tenaculum was applied to this  location and 4 mL of lidocaine was injected at 4 and 7 o'clock to produce a  paracervical block.  The cervix was pin point and was dilated initially  with  lacrimal dilators and then with a Pratt dilator.  Using ultrasound guidance  the probe for the cryo ablation machinery was then advanced through the  cervix into the right cornu.  A freeze spot technique was then performed.  This was repeated in the left cornu.  The probe was then removed.  There was  minimal bleeding noted  and the tenaculum was removed with a good hemostasis noted.  The patient  tolerated the procedure well.  The patient was taken to the PACU in stable  condition.   PATHOLOGY:  Cervical biopsies and endocervical curettings.                                               Lisa A.  Tamela Oddi, M.D.    Judee Clara  D:  06/30/2002  T:  06/30/2002  Job:  [id number]

## 2010-09-27 NOTE — H&P (Signed)
NAME:  Bailey Patterson, Bailey Patterson                        ACCOUNT NO.:  [id number]   MEDICAL RECORD NO.:  [id number]                   PATIENT TYPE:  OUT   LOCATION:  ULT                                  FACILITY:  [facility]   PHYSICIAN:  Roseanna Rainbow, M.D.         DATE OF BIRTH:  [birth year]   DATE OF ADMISSION:  06/14/2002  DATE OF DISCHARGE:                                HISTORY & PHYSICAL   CHIEF COMPLAINT:  The patient is a 50 year old African-American female with  abnormal uterine bleeding who presents for endometrial cryoablation and  cervical biopsy.   HISTORY OF PRESENT ILLNESS:  The patient gives a several-year history of 15-  day cycles.  No previous workup prior to her initial presentation to this  office in January 2004.  Workup to date has been remarkable for a hemoglobin  of 11.6, normal prolactin, normal TSH, and an ultrasound demonstrated a  uterus that was 11.5 cm in its sagittal diameter with fluid in the  endometrial cavity, a 2.2 cm left paraovarian cyst, normal bilateral  ovaries, and small free fluid.  An endometrial biopsy demonstrated benign  secretory endometrium and a Pap smear is pending from June 06, 2002.   PAST OBSTETRICAL AND GYNECOLOGICAL HISTORY:  She has been pregnancy five  times, has had five live births.  She is heterosexual, six sexual partners  lifetime.  She is status post a tubal ligation.  She also uses condoms.  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   PAST MEDICAL HISTORY:  Remarkable for a history of deep venous thrombosis.   PAST SURGICAL HISTORY:  She has had five cesarean deliveries and she has had  thrombectomies.   ALLERGIES:  No known drug allergies.   SOCIAL HISTORY:  She is divorced.  She denies any tobacco or substance  abuse.  She reports rare alcohol use.   MEDICATIONS:  None.   FAMILY HISTORY:  Heart attack, diabetes, and hypertension.   PHYSICAL  EXAMINATION:  VITAL SIGNS:  Blood pressure 118/82, pulse 88,  temperature 97.5, weight 109 and three-quarters pounds.  GENERAL:  Thin.  Appears stated age.  No apparent distress.  BREASTS:  No masses, no discharge.  ABDOMEN:  Soft, nontender.  No organomegaly.  PELVIC:  The uterus is anteverted, upper limits of normal size, nontender.  The adnexa are nonpalpable, nontender.  On speculum exam there is an area of  leukoplakia noted at 6 o'clock.   ASSESSMENT:  1. Dysfunction uterine bleeding.  2. 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    PLAN:  Endometrial cryoablation with cervical biopsy.  The risks, benefits,  and alternative forms of management were reviewed with the patient and  informed consent was obtained.  Roseanna Rainbow, M.D.    Judee Clara  D:  06/14/2002  T:  06/14/2002  Job:  [id number]

## 2010-10-10 ENCOUNTER — Telehealth: Payer: Self-pay | Admitting: *Deleted

## 2010-10-10 NOTE — Telephone Encounter (Signed)
I called pt and Left mess for patient to call back to verify if she does need 12 tablets/mo and how many migraine HAs she has per month.

## 2010-10-10 NOTE — Telephone Encounter (Signed)
Called above number to obtain PA for Zomig 5mg  1 po qd # 12. Coverage Review form is being faxed.

## 2010-10-17 NOTE — Telephone Encounter (Signed)
I called pt again today and left mess to call back.. I need to know how many headaches she has a month to complete the PA form for Zomig.

## 2010-10-22 NOTE — Telephone Encounter (Signed)
Need to call [employer] at [phone] and give verbal for # of h/a's q mth. (ok vm on #)

## 2010-10-23 ENCOUNTER — Telehealth: Payer: Self-pay | Admitting: *Deleted

## 2010-10-23 NOTE — Telephone Encounter (Signed)
Per pt she has 10 migraines/month. I called number below and informed [employer].

## 2010-10-23 NOTE — Telephone Encounter (Signed)
PA faxed for Approval for Quantity Limit Override: Zomig on 10/16/10 [see previous phone note 10/10/10] Approval received and faxed to pharmacy @ [phone] Pt Informed.

## 2010-12-02 ENCOUNTER — Emergency Department: Payer: Self-pay | Admitting: *Deleted

## 2010-12-02 ENCOUNTER — Emergency Department (HOSPITAL_COMMUNITY)
Admission: EM | Admit: 2010-12-02 | Discharge: 2010-12-03 | Disposition: A | Discharge status: Home / Self Care | Payer: BC Managed Care – PPO | Attending: Emergency Medicine | Admitting: Emergency Medicine

## 2010-12-02 DIAGNOSIS — Z86718 Personal history of other venous thrombosis and embolism: Secondary | ICD-10-CM | POA: Insufficient documentation

## 2010-12-02 DIAGNOSIS — G43909 Migraine, unspecified, not intractable, without status migrainosus: Secondary | ICD-10-CM | POA: Insufficient documentation

## 2010-12-19 ENCOUNTER — Encounter: Payer: Self-pay | Admitting: Internal Medicine

## 2010-12-20 ENCOUNTER — Encounter: Payer: Self-pay | Admitting: Internal Medicine

## 2010-12-20 ENCOUNTER — Other Ambulatory Visit (INDEPENDENT_AMBULATORY_CARE_PROVIDER_SITE_OTHER): Payer: BC Managed Care – PPO

## 2010-12-20 ENCOUNTER — Ambulatory Visit (INDEPENDENT_AMBULATORY_CARE_PROVIDER_SITE_OTHER): Payer: BC Managed Care – PPO | Admitting: Internal Medicine

## 2010-12-20 DIAGNOSIS — R110 Nausea: Secondary | ICD-10-CM

## 2010-12-20 DIAGNOSIS — G43909 Migraine, unspecified, not intractable, without status migrainosus: Secondary | ICD-10-CM | POA: Insufficient documentation

## 2010-12-20 DIAGNOSIS — R209 Unspecified disturbances of skin sensation: Secondary | ICD-10-CM

## 2010-12-20 DIAGNOSIS — R202 Paresthesia of skin: Secondary | ICD-10-CM

## 2010-12-20 DIAGNOSIS — F411 Generalized anxiety disorder: Secondary | ICD-10-CM

## 2010-12-20 HISTORY — DX: Paresthesia of skin: R20.2

## 2010-12-20 HISTORY — DX: Migraine, unspecified, not intractable, without status migrainosus: G43.909

## 2010-12-20 LAB — CBC WITH DIFFERENTIAL/PLATELET
Basophils Relative: 0.4 % (ref 0.0–3.0)
Eosinophils Absolute: 0 10*3/uL (ref 0.0–0.7)
HCT: 42.2 % (ref 36.0–46.0)
Hemoglobin: 14 g/dL (ref 12.0–15.0)
Lymphocytes Relative: 32.6 % (ref 12.0–46.0)
Lymphs Abs: 1.7 10*3/uL (ref 0.7–4.0)
MCHC: 33 g/dL (ref 30.0–36.0)
MCV: 87.5 fl (ref 78.0–100.0)
Monocytes Absolute: 0.3 10*3/uL (ref 0.1–1.0)
Neutro Abs: 3.1 10*3/uL (ref 1.4–7.7)
RBC: 4.83 Mil/uL (ref 3.87–5.11)
RDW: 14.1 % (ref 11.5–14.6)

## 2010-12-20 LAB — VITAMIN B12: Vitamin B-12: 713 pg/mL (ref 211–911)

## 2010-12-20 LAB — COMPREHENSIVE METABOLIC PANEL
ALT: 15 U/L (ref 0–35)
AST: 18 U/L (ref 0–37)
Alkaline Phosphatase: 73 U/L (ref 39–117)
BUN: 15 mg/dL (ref 6–23)
Creatinine, Ser: 0.6 mg/dL (ref 0.4–1.2)
Total Bilirubin: 0.7 mg/dL (ref 0.3–1.2)

## 2010-12-20 LAB — URINALYSIS
Hgb urine dipstick: NEGATIVE
Ketones, ur: NEGATIVE
Total Protein, Urine: NEGATIVE
Urine Glucose: NEGATIVE

## 2010-12-20 LAB — TSH: TSH: 0.75 u[IU]/mL (ref 0.35–5.50)

## 2010-12-20 MED ORDER — ISOMETHEPTENE-APAP-DICHLORAL 65-325-100 MG PO CAPS: 1.0000 | ORAL_CAPSULE | ORAL | Status: DC | PRN

## 2010-12-20 MED ORDER — ZONISAMIDE 25 MG PO CAPS: 25.0000 mg | ORAL_CAPSULE | Freq: Every day | ORAL | Status: DC

## 2010-12-20 MED ORDER — FLUOXETINE HCL 10 MG PO CAPS: 10.0000 mg | ORAL_CAPSULE | Freq: Every day | ORAL | Status: DC

## 2010-12-20 NOTE — Assessment & Plan Note (Addendum)
Discussed. The pt will f/u w/Dr Neale Burly Try Fluoxetine. I gave her Midrin for now to use prn

## 2010-12-20 NOTE — Patient Instructions (Signed)
Follow-up with Dr. Freeman 

## 2010-12-20 NOTE — Progress Notes (Signed)
  Subjective:    Patient ID: Bailey Patterson, female    DOB: [birth year], 50 y.o.   MRN: [id number]  HPI  C/o HAs bad and frequent, daily and all the time She had been seeing a HA specialist Dr Neale Burly C/o severe HA  10/10 now, nauseated; meds don't help Being separated now - stressed  Review of Systems  Constitutional: Negative for chills, activity change, appetite change, fatigue and unexpected weight change.  HENT: Negative for congestion, mouth sores and sinus pressure.   Eyes: Negative for visual disturbance.  Respiratory: Negative for cough and chest tightness.   Gastrointestinal: Negative for nausea and abdominal pain.  Genitourinary: Negative for frequency, difficulty urinating and vaginal pain.  Musculoskeletal: Negative for back pain and gait problem.  Skin: Negative for pallor and rash.  Neurological: Negative for dizziness, tremors, weakness, numbness and headaches.  Psychiatric/Behavioral: Negative for suicidal ideas, confusion and sleep disturbance. The patient is nervous/anxious.        Objective:   Physical Exam  Constitutional: She appears well-developed and well-nourished. No distress.  HENT:  Head: Normocephalic.  Right Ear: External ear normal.  Left Ear: External ear normal.  Nose: Nose normal.  Mouth/Throat: Oropharynx is clear and moist.  Eyes: Conjunctivae are normal. Pupils are equal, round, and reactive to light. Right eye exhibits no discharge. Left eye exhibits no discharge.  Neck: Normal range of motion. Neck supple. No JVD present. No tracheal deviation present. No thyromegaly present.  Cardiovascular: Normal rate, regular rhythm and normal heart sounds.   Pulmonary/Chest: No stridor. No respiratory distress. She has no wheezes.  Abdominal: Soft. Bowel sounds are normal. She exhibits no distension and no mass. There is no tenderness. There is no rebound and no guarding.  Musculoskeletal: She exhibits no edema and no tenderness.    Lymphadenopathy:    She has no cervical adenopathy.  Neurological: She displays normal reflexes. No cranial nerve deficit. She exhibits normal muscle tone. Coordination normal.  Skin: No rash noted. No erythema.  Psychiatric: She has a normal mood and affect. Her behavior is normal. Judgment and thought content normal.       Stressed  Eyes are tender to palp        Assessment & Plan:    A complex case

## 2010-12-20 NOTE — Assessment & Plan Note (Signed)
Rx prn 

## 2010-12-22 ENCOUNTER — Encounter: Payer: Self-pay | Admitting: Internal Medicine

## 2010-12-22 NOTE — Assessment & Plan Note (Addendum)
Discussed See Meds 

## 2011-01-30 ENCOUNTER — Telehealth: Payer: Self-pay | Admitting: *Deleted

## 2011-01-30 DIAGNOSIS — G43909 Migraine, unspecified, not intractable, without status migrainosus: Secondary | ICD-10-CM

## 2011-01-30 NOTE — Telephone Encounter (Signed)
Ok done

## 2011-01-30 NOTE — Telephone Encounter (Signed)
Family member called for patient req referral to [facility] for continued headaches.

## 2011-01-30 NOTE — Telephone Encounter (Signed)
Left VM for patient

## 2011-02-07 LAB — DIFFERENTIAL
Basophils Absolute: 0
Basophils Relative: 1
Lymphocytes Relative: 39
Neutro Abs: 3.2

## 2011-02-07 LAB — URINE MICROSCOPIC-ADD ON

## 2011-02-07 LAB — URINALYSIS, ROUTINE W REFLEX MICROSCOPIC
Glucose, UA: NEGATIVE
Nitrite: NEGATIVE
Specific Gravity, Urine: 1.03
pH: 5

## 2011-02-07 LAB — POCT I-STAT, CHEM 8
BUN: 15
Chloride: 109
HCT: 43
Sodium: 140
TCO2: 23

## 2011-02-07 LAB — CBC
MCHC: 33.9
Platelets: 294
RDW: 13.8

## 2011-02-07 LAB — POCT PREGNANCY, URINE: Preg Test, Ur: NEGATIVE

## 2011-02-07 LAB — CK: Total CK: 113

## 2011-03-17 ENCOUNTER — Ambulatory Visit: Payer: BC Managed Care – PPO | Admitting: Internal Medicine

## 2011-03-17 DIAGNOSIS — Z0289 Encounter for other administrative examinations: Secondary | ICD-10-CM

## 2011-12-23 ENCOUNTER — Emergency Department (HOSPITAL_COMMUNITY)
Admission: EM | Admit: 2011-12-23 | Discharge: 2011-12-23 | Disposition: A | Discharge status: Home / Self Care | Payer: Self-pay | Attending: Emergency Medicine | Admitting: Emergency Medicine

## 2011-12-23 ENCOUNTER — Encounter (HOSPITAL_COMMUNITY): Payer: Self-pay | Admitting: *Deleted

## 2011-12-23 DIAGNOSIS — Z86718 Personal history of other venous thrombosis and embolism: Secondary | ICD-10-CM | POA: Insufficient documentation

## 2011-12-23 DIAGNOSIS — G43909 Migraine, unspecified, not intractable, without status migrainosus: Secondary | ICD-10-CM | POA: Insufficient documentation

## 2011-12-23 HISTORY — DX: Acute embolism and thrombosis of unspecified deep veins of unspecified lower extremity: I82.409

## 2011-12-23 LAB — CBC WITH DIFFERENTIAL/PLATELET
Basophils Absolute: 0 10*3/uL (ref 0.0–0.1)
Basophils Relative: 1 % (ref 0–1)
HCT: 39.9 % (ref 36.0–46.0)
Hemoglobin: 13.4 g/dL (ref 12.0–15.0)
Lymphocytes Relative: 29 % (ref 12–46)
MCHC: 33.6 g/dL (ref 30.0–36.0)
Monocytes Absolute: 0.4 10*3/uL (ref 0.1–1.0)
Neutro Abs: 3.4 10*3/uL (ref 1.7–7.7)
Neutrophils Relative %: 63 % (ref 43–77)
RDW: 13.4 % (ref 11.5–15.5)
WBC: 5.5 10*3/uL (ref 4.0–10.5)

## 2011-12-23 LAB — POCT I-STAT, CHEM 8
BUN: 14 mg/dL (ref 6–23)
Calcium, Ion: 1.25 mmol/L — ABNORMAL HIGH (ref 1.12–1.23)
Chloride: 108 mEq/L (ref 96–112)
HCT: 43 % (ref 36.0–46.0)
Potassium: 3.7 mEq/L (ref 3.5–5.1)

## 2011-12-23 MED ORDER — SODIUM CHLORIDE 0.9 % IV BOLUS (SEPSIS)
1000.0000 mL | Freq: Once | INTRAVENOUS | Status: AC
  Administered 2011-12-23: 1000 mL via INTRAVENOUS

## 2011-12-23 MED ORDER — DIPHENHYDRAMINE HCL 50 MG/ML IJ SOLN
25.0000 mg | Freq: Once | INTRAMUSCULAR | Status: AC
  Administered 2011-12-23: 25 mg via INTRAVENOUS
  Filled 2011-12-23: qty 1

## 2011-12-23 MED ORDER — METOCLOPRAMIDE HCL 5 MG/ML IJ SOLN
10.0000 mg | Freq: Once | INTRAMUSCULAR | Status: AC
  Administered 2011-12-23: 10 mg via INTRAVENOUS
  Filled 2011-12-23: qty 2

## 2011-12-23 MED ORDER — DEXAMETHASONE SODIUM PHOSPHATE 10 MG/ML IJ SOLN
10.0000 mg | Freq: Once | INTRAMUSCULAR | Status: AC
  Administered 2011-12-23: 10 mg via INTRAVENOUS
  Filled 2011-12-23: qty 1

## 2011-12-23 NOTE — ED Notes (Signed)
Discharged  Home with written and verbal instructions.  No questions or concerns at discharge.

## 2011-12-23 NOTE — ED Notes (Signed)
Pt woke up with dizziness and woke up with headache and states has migraines.  Pt reports vomiting..  No facial or extremity deficits

## 2011-12-23 NOTE — ED Provider Notes (Signed)
History     CSN: [id number]  Arrival date & time 12/23/11  8295   First MD Initiated Contact with Patient 12/23/11 1547      Chief Complaint  Patient presents with  . Dizziness    (Consider location/radiation/quality/duration/timing/severity/associated sxs/prior treatment) Patient is a 51 y.o. female presenting with headaches. The history is provided by the patient.  Headache  This is a chronic problem. The current episode started 6 to 12 hours ago. The problem occurs constantly. The problem has been gradually worsening. The headache is associated with an unknown factor. The pain is located in the occipital region. The quality of the pain is described as throbbing. The pain is moderate. The pain does not radiate. Associated symptoms include nausea. Pertinent negatives include no fever, no near-syncope, no palpitations, no shortness of breath and no vomiting. Associated symptoms comments: Dizziness. She has tried nothing for the symptoms.    Past Medical History  Diagnosis Date  . ANXIETY 11/16/2009  . CHEST WALL PAIN, ACUTE 04/16/2010  . FATIGUE 03/28/2009  . KNEE PAIN 02/04/2010  . MENTAL CONFUSION 11/16/2009  . MIGRAINE, COMMON W/INTRACTABLE MIGRAINE 03/28/2009  . Nausea alone 03/28/2009  . Hernia   . Phlebitis, complicating pregnancy or puerperium   . DVT (deep venous thrombosis)     Past Surgical History  Procedure Date  . Hernia repair   . Cesarean section     Family History  Problem Relation Age of Onset  . Diabetes Other   . Hypertension Other   . Kidney disease Other     History  Substance Use Topics  . Smoking status: Never Smoker   . Smokeless tobacco: Not on file  . Alcohol Use: No    OB History    Grav Para Term Preterm Abortions TAB SAB Ect Mult Living                  Review of Systems  Constitutional: Negative for fever and chills.  HENT: Negative for congestion, rhinorrhea, neck pain and neck stiffness.   Eyes: Positive for photophobia.  Negative for pain and visual disturbance.  Respiratory: Negative for cough and shortness of breath.   Cardiovascular: Negative for chest pain, palpitations and near-syncope.  Gastrointestinal: Positive for nausea. Negative for vomiting, abdominal pain, diarrhea and constipation.  Genitourinary: Negative.   Musculoskeletal: Negative.   Skin: Negative.   Neurological: Positive for dizziness, light-headedness and headaches. Negative for syncope, facial asymmetry, speech difficulty, weakness and numbness.  All other systems reviewed and are negative.    Allergies  Topiramate  Home Medications   Current Outpatient Rx  Name Route Sig Dispense Refill  . IBUPROFEN 200 MG PO TABS Oral Take 400 mg by mouth every 6 (six) hours as needed. For headache    . ZOLMITRIPTAN 5 MG PO TABS Oral Take 5 mg by mouth as needed.       BP 142/70  Pulse 89  Temp 98 F (36.7 C) (Oral)  Resp 18  SpO2 100%  Physical Exam  Nursing note and vitals reviewed. Constitutional: She is oriented to person, place, and time. She appears well-developed and well-nourished. No distress.  HENT:  Head: Normocephalic and atraumatic.  Mouth/Throat: Oropharynx is clear and moist.  Eyes: Conjunctivae and EOM are normal. Pupils are equal, round, and reactive to light.  Fundoscopic exam:      The right eye shows no hemorrhage and no papilledema.       The left eye shows no hemorrhage and no papilledema.  Neck: Normal range of motion and full passive range of motion without pain. Neck supple. No spinous process tenderness and no muscular tenderness present.  Cardiovascular: Normal rate, regular rhythm, normal heart sounds and intact distal pulses.   Pulmonary/Chest: Effort normal and breath sounds normal. She has no wheezes. She has no rales.  Abdominal: Soft. She exhibits no distension. There is no tenderness.  Musculoskeletal: Normal range of motion.  Neurological: She is alert and oriented to person, place, and time. She  has normal strength. No cranial nerve deficit or sensory deficit. She displays a negative Romberg sign. Coordination and gait normal. GCS eye subscore is 4. GCS verbal subscore is 5. GCS motor subscore is 6.  Skin: Skin is warm and dry.    ED Course  Procedures (including critical care time)  Labs Reviewed  POCT I-STAT, CHEM 8 - Abnormal; Notable for the following:    Calcium, Ion 1.25 (*)     All other components within normal limits  CBC WITH DIFFERENTIAL   No results found.   1. Migraine       MDM  51 yo female with PMHx of migraines who presents for gradual onset headache that started shortly after waking this morning.  Headache primarily occipital and pt reports feels similar to prior migraines.  She had brief episode of associated dizziness described as "feeling the room was moving back and forth" this morning.  Dizziness resolved prior to arrival.  Normal neuro exam including normal sensation, motor, strength and cerebellar testing.  Nml coordination and gait.  Negative Romberg.  Headache not sudden onset and no neuro deficits, doubt SAH.  No history of fever or signs of meningitis.  Will give IVF, Benadryl, Reglan, and reassess.  Sx improved significantly after migraine cocktail.  Pt appropriate for DC home with PCP follow-up.  Return precautions provided.        Cherre Robins, MD 12/23/11 [phone]

## 2011-12-23 NOTE — ED Notes (Signed)
Meal tray order for patient

## 2011-12-23 NOTE — ED Notes (Signed)
MD at bedside. 

## 2011-12-23 NOTE — ED Notes (Signed)
Dinner meal provided

## 2012-06-17 ENCOUNTER — Telehealth: Payer: Self-pay | Admitting: *Deleted

## 2012-06-17 NOTE — Telephone Encounter (Signed)
Rf req for Amitriptyline 10 mg 1 po qhs. # 30. Ok to Rf? Last OV 12/2010.

## 2012-06-18 MED ORDER — AMITRIPTYLINE HCL 25 MG PO TABS: 25.0000 mg | ORAL_TABLET | Freq: Every day | ORAL | Status: DC

## 2012-06-18 NOTE — Telephone Encounter (Signed)
OK to fill this prescription with additional refills x1 Needs ov Thank you!  

## 2012-06-18 NOTE — Telephone Encounter (Signed)
Done

## 2012-08-11 ENCOUNTER — Telehealth: Payer: Self-pay | Admitting: Internal Medicine

## 2012-08-11 ENCOUNTER — Encounter: Payer: Self-pay | Admitting: Internal Medicine

## 2012-08-11 ENCOUNTER — Ambulatory Visit (INDEPENDENT_AMBULATORY_CARE_PROVIDER_SITE_OTHER): Payer: BC Managed Care – PPO | Admitting: Internal Medicine

## 2012-08-11 VITALS — BP 110/82 | HR 105 | Resp 16 | Wt 129.2 lb

## 2012-08-11 DIAGNOSIS — G8929 Other chronic pain: Secondary | ICD-10-CM

## 2012-08-11 DIAGNOSIS — K921 Melena: Secondary | ICD-10-CM | POA: Insufficient documentation

## 2012-08-11 DIAGNOSIS — K6289 Other specified diseases of anus and rectum: Secondary | ICD-10-CM

## 2012-08-11 DIAGNOSIS — K5900 Constipation, unspecified: Secondary | ICD-10-CM | POA: Insufficient documentation

## 2012-08-11 DIAGNOSIS — K5909 Other constipation: Secondary | ICD-10-CM | POA: Insufficient documentation

## 2012-08-11 HISTORY — DX: Other chronic pain: G89.29

## 2012-08-11 MED ORDER — TRIAMCINOLONE ACETONIDE 0.5 % EX OINT: TOPICAL_OINTMENT | Freq: Two times a day (BID) | CUTANEOUS | Status: DC

## 2012-08-11 MED ORDER — NITROGLYCERIN 0.4 % RE OINT: 1.0000 "application " | TOPICAL_OINTMENT | Freq: Four times a day (QID) | RECTAL | Status: DC | PRN

## 2012-08-11 MED ORDER — LINACLOTIDE 145 MCG PO CAPS: 145.0000 ug | ORAL_CAPSULE | Freq: Every day | ORAL | Status: DC | PRN

## 2012-08-11 MED ORDER — HYDROCORTISONE ACETATE 25 MG RE SUPP: 25.0000 mg | Freq: Two times a day (BID) | RECTAL | Status: DC

## 2012-08-11 NOTE — Progress Notes (Signed)
  Subjective:    Rectal Bleeding  Episode onset: off and on. The onset is undetermined. The problem occurs frequently. The problem has been gradually worsening. The pain is severe. The stool is described as hard. There was no prior successful therapy. There was no prior unsuccessful therapy. Associated symptoms include rectal pain. Pertinent negatives include no anorexia, no fever, no abdominal pain, no diarrhea, no hematemesis, no nausea, no hematuria, no vaginal bleeding, no vaginal discharge, no headaches, no coughing and no rash. Her past medical history does not include abdominal surgery or inflammatory bowel disease.    C/o rectal pan and bleeding off and on x years. C/o constipation: BMs once every 2 wks(!).  Review of Systems  Constitutional: Negative for fever, chills, activity change, appetite change, fatigue and unexpected weight change.  HENT: Negative for congestion, mouth sores and sinus pressure.   Eyes: Negative for visual disturbance.  Respiratory: Negative for cough and chest tightness.   Gastrointestinal: Positive for hematochezia and rectal pain. Negative for nausea, abdominal pain, diarrhea, anorexia and hematemesis.  Genitourinary: Negative for frequency, hematuria, vaginal bleeding, vaginal discharge, difficulty urinating and vaginal pain.  Musculoskeletal: Negative for back pain and gait problem.  Skin: Negative for pallor and rash.  Neurological: Negative for dizziness, tremors, weakness, numbness and headaches.  Psychiatric/Behavioral: Negative for suicidal ideas, confusion and sleep disturbance. The patient is nervous/anxious.        Objective:   Physical Exam  Constitutional: She appears well-developed and well-nourished. No distress.  HENT:  Head: Normocephalic.  Right Ear: External ear normal.  Left Ear: External ear normal.  Nose: Nose normal.  Mouth/Throat: Oropharynx is clear and moist.  Eyes: Conjunctivae are normal. Pupils are equal, round, and  reactive to light. Right eye exhibits no discharge. Left eye exhibits no discharge.  Neck: Normal range of motion. Neck supple. No JVD present. No tracheal deviation present. No thyromegaly present.  Cardiovascular: Normal rate, regular rhythm and normal heart sounds.   Pulmonary/Chest: No stridor. No respiratory distress. She has no wheezes.  Abdominal: Soft. Bowel sounds are normal. She exhibits no distension and no mass. There is no tenderness. There is no rebound and no guarding.  Genitourinary: Guaiac negative stool.  No ext hemorrhoids  Musculoskeletal: She exhibits no edema and no tenderness.  Lymphadenopathy:    She has no cervical adenopathy.  Neurological: She displays normal reflexes. No cranial nerve deficit. She exhibits normal muscle tone. Coordination normal.  Skin: No rash noted. No erythema.  Psychiatric: She has a normal mood and affect. Her behavior is normal. Judgment and thought content normal.  Stressed    Procedure: Anoscopy Indication: pain, bleeding Risks and benefits were explained to pt in detail. He was placed in lateral decubitus position. Digital rectal exam was painful (<1 cm deep)  . Stool was guaiac negative. . Anoscope was introduced with difficulty 1 cm in. No masses. Upon withdrawal normal mucosa was observed. There was a fissure at 12 o'clock. Impression: Anal fissure. Incomplete exam Tolerated OK. Complications - none.       Assessment & Plan:

## 2012-08-11 NOTE — Telephone Encounter (Signed)
triamc oint - done Thx

## 2012-08-11 NOTE — Assessment & Plan Note (Signed)
Drink more water, high fiber diet Labs GI cons later See meds

## 2012-08-11 NOTE — Assessment & Plan Note (Addendum)
Anusol HC NTG oint

## 2012-08-11 NOTE — Assessment & Plan Note (Signed)
4/14 ?tear/fissure Anusol HC Linzess 145

## 2012-08-11 NOTE — Telephone Encounter (Signed)
Pt went to the pharmacy to pick up RX's.  She didn't pick up the nitroglycerin ointment because it was $400. Without insurance.  Is there something else she can try?

## 2012-08-12 ENCOUNTER — Telehealth: Payer: Self-pay | Admitting: Internal Medicine

## 2012-08-12 NOTE — Telephone Encounter (Signed)
See other phone note.  This has been done.

## 2012-08-12 NOTE — Telephone Encounter (Signed)
Pt is aware.  

## 2012-08-12 NOTE — Telephone Encounter (Signed)
Called for less expensive rectal ointment to be called to [facility]  Seen 08/11/12; diagnosed with anal fissure.  New insurance has not yet started therefore Kenalog/Triamcinolone cream was $400.00.  Was able to purchase capsules and suppositories.  Informed Dr Posey Rea is out of the office this afternoon; returning to office 08/13/12.  Please call back 08/13/12.

## 2012-08-12 NOTE — Telephone Encounter (Signed)
LMOM to call.

## 2012-08-17 ENCOUNTER — Other Ambulatory Visit: Payer: Self-pay | Admitting: Internal Medicine

## 2012-08-17 DIAGNOSIS — K602 Anal fissure, unspecified: Secondary | ICD-10-CM

## 2012-08-17 NOTE — Telephone Encounter (Signed)
Bailey Patterson called to say she is swollen today and is bleeding bright red blood.  She wants the nitroglycerin ointment in 0.2% called in to [facility] [address]  Phone number is [phone].  This pharmacy has this strength for $38.00.  The other pharmacy charged $400.00 for the 0.4% strength.

## 2012-08-18 NOTE — Telephone Encounter (Signed)
Ok to use a different strength. i would like her to see a Careers adviser. Is it ok w/her for me to refer? Thx

## 2012-08-19 NOTE — Telephone Encounter (Signed)
Ref - done Pls call in 1 tube of NTG 2% oint (RX: use on anal area qid prn rectal pain) in Thx

## 2012-08-19 NOTE — Telephone Encounter (Signed)
Rf phoned in to pharmacy requested. Unable to document because pharmacy is not in [facility]. Left detailed mess informing pt.

## 2012-08-19 NOTE — Telephone Encounter (Signed)
I spoke with Aram Beecham.  She does want to be referred to a surgeon.   Please call in the nitroglycerin ointment 0.2% to [facility] [address]

## 2012-08-19 NOTE — Telephone Encounter (Signed)
30 g

## 2012-08-23 ENCOUNTER — Ambulatory Visit: Payer: BC Managed Care – PPO | Admitting: Internal Medicine

## 2012-08-30 ENCOUNTER — Encounter (INDEPENDENT_AMBULATORY_CARE_PROVIDER_SITE_OTHER): Payer: Self-pay

## 2012-08-31 ENCOUNTER — Ambulatory Visit (INDEPENDENT_AMBULATORY_CARE_PROVIDER_SITE_OTHER): Payer: Self-pay | Admitting: General Surgery

## 2012-12-20 ENCOUNTER — Other Ambulatory Visit: Payer: Self-pay | Admitting: Internal Medicine

## 2012-12-20 DIAGNOSIS — Z1231 Encounter for screening mammogram for malignant neoplasm of breast: Secondary | ICD-10-CM

## 2012-12-23 ENCOUNTER — Ambulatory Visit (HOSPITAL_COMMUNITY)
Admission: RE | Admit: 2012-12-23 | Discharge: 2012-12-23 | Disposition: A | Discharge status: Home / Self Care | Payer: Medicaid Other | Source: Ambulatory Visit | Attending: Internal Medicine | Admitting: Internal Medicine

## 2012-12-23 DIAGNOSIS — Z1231 Encounter for screening mammogram for malignant neoplasm of breast: Secondary | ICD-10-CM | POA: Insufficient documentation

## 2013-01-13 ENCOUNTER — Emergency Department (HOSPITAL_COMMUNITY)
Admission: EM | Admit: 2013-01-13 | Discharge: 2013-01-13 | Disposition: A | Discharge status: Home / Self Care | Payer: Medicaid Other | Source: Home / Self Care

## 2013-01-13 ENCOUNTER — Encounter (HOSPITAL_COMMUNITY): Payer: Self-pay | Admitting: *Deleted

## 2013-01-13 DIAGNOSIS — R42 Dizziness and giddiness: Secondary | ICD-10-CM

## 2013-01-13 DIAGNOSIS — E860 Dehydration: Secondary | ICD-10-CM

## 2013-01-13 DIAGNOSIS — R5381 Other malaise: Secondary | ICD-10-CM

## 2013-01-13 DIAGNOSIS — R531 Weakness: Secondary | ICD-10-CM

## 2013-01-13 LAB — POCT I-STAT, CHEM 8
BUN: 18 mg/dL (ref 6–23)
Chloride: 110 mEq/L (ref 96–112)
Creatinine, Ser: 0.8 mg/dL (ref 0.50–1.10)
Potassium: 4.7 mEq/L (ref 3.5–5.1)
Sodium: 143 mEq/L (ref 135–145)
TCO2: 24 mmol/L (ref 0–100)

## 2013-01-13 MED ORDER — ONDANSETRON 4 MG PO TBDP
4.0000 mg | ORAL_TABLET | Freq: Once | ORAL | Status: AC
  Administered 2013-01-13: 4 mg via ORAL

## 2013-01-13 MED ORDER — KETOROLAC TROMETHAMINE 30 MG/ML IJ SOLN
30.0000 mg | Freq: Once | INTRAMUSCULAR | Status: AC
  Administered 2013-01-13: 30 mg via INTRAMUSCULAR

## 2013-01-13 MED ORDER — KETOROLAC TROMETHAMINE 30 MG/ML IJ SOLN
INTRAMUSCULAR | Status: AC
  Filled 2013-01-13: qty 1

## 2013-01-13 MED ORDER — ONDANSETRON 4 MG PO TBDP
ORAL_TABLET | ORAL | Status: AC
  Filled 2013-01-13: qty 1

## 2013-01-13 MED ORDER — ONDANSETRON HCL 4 MG PO TABS: 4.0000 mg | ORAL_TABLET | Freq: Four times a day (QID) | ORAL | Status: DC

## 2013-01-13 NOTE — ED Provider Notes (Signed)
CSN: [id number]     Arrival date & time 01/13/13  0957 History   First MD Initiated Contact with Patient 01/13/13 1028     Chief Complaint  Patient presents with  . Dizziness   (Consider location/radiation/quality/duration/timing/severity/associated sxs/prior Treatment) HPI Comments: 52 year old female is accompanied by her family for a complex of symptoms that are both acute and chronic. For the past 2 weeks she has been feeling dizzy unusually tired, lightheadedness and seeing spots in her eyes. She saw her PCP for this 1 week ago and had blood work drawn. According to her her blood work was normal and he was unsure as to what was wrong with her. The patient states for the past couple of days her blood pressures have been low at home with a reading of 60/40 yesterday. Although she has side effects to Topamax she was asked to restart this last week. She took 5 days of a Topamax and this caused an increase in her symptoms of dizziness, fatigue, lightheadedness, confusion, slow thinking and movement. She stopped 2 days ago and has minor improvement. Her medical history includes fatigue, mental confusion, anxiety and depression and chronic near daily migraine headaches.    Past Medical History  Diagnosis Date  . ANXIETY 11/16/2009  . CHEST WALL PAIN, ACUTE 04/16/2010  . FATIGUE 03/28/2009  . KNEE PAIN 02/04/2010  . MENTAL CONFUSION 11/16/2009  . MIGRAINE, COMMON W/INTRACTABLE MIGRAINE 03/28/2009  . Nausea alone 03/28/2009  . Hernia   . Phlebitis, complicating pregnancy or puerperium   . DVT (deep venous thrombosis)    Past Surgical History  Procedure Laterality Date  . Hernia repair    . Cesarean section    . Endometrial ablation     Family History  Problem Relation Age of Onset  . Diabetes Other   . Hypertension Other   . Kidney disease Other   . Diabetes Mother   . Heart disease Mother   . Kidney disease Mother   . Hypertension Mother   . Stroke Mother    History  Substance Use  Topics  . Smoking status: Never Smoker   . Smokeless tobacco: Not on file  . Alcohol Use: No   OB History   Grav Para Term Preterm Abortions TAB SAB Ect Mult Living                 Review of Systems  Constitutional: Positive for activity change and fatigue. Negative for fever.  HENT: Negative for sore throat, neck pain and neck stiffness.   Eyes:       Occasional spots in her eyes.  Cardiovascular: Positive for chest pain. Negative for palpitations and leg swelling.  Gastrointestinal: Negative.   Genitourinary: Negative.        No menses for one year status post a "cold ablation".  Musculoskeletal: Negative for joint swelling, arthralgias and gait problem.  Skin: Negative for rash.  Neurological: Positive for dizziness, light-headedness and headaches. Negative for seizures and numbness.    Allergies  Topiramate  Home Medications   Current Outpatient Rx  Name  Route  Sig  Dispense  Refill  . amitriptyline (ELAVIL) 25 MG tablet   Oral   Take 1 tablet (25 mg total) by mouth at bedtime.   30 tablet   1   . hydrocortisone (ANUSOL-HC) 25 MG suppository   Rectal   Place 1 suppository (25 mg total) rectally 2 (two) times daily.   20 suppository   1   . ibuprofen (ADVIL,MOTRIN) 200 MG  tablet   Oral   Take 400 mg by mouth every 6 (six) hours as needed. For headache         . Linaclotide (LINZESS) 145 MCG CAPS   Oral   Take 1 capsule (145 mcg total) by mouth daily as needed.   30 capsule   11   . ondansetron (ZOFRAN) 4 MG tablet   Oral   Take 1 tablet (4 mg total) by mouth every 6 (six) hours.   12 tablet   0   . triamcinolone ointment (KENALOG) 0.5 %   Topical   Apply topically 2 (two) times daily.   15 g   1   . zolmitriptan (ZOMIG) 5 MG tablet   Oral   Take 5 mg by mouth as needed.           BP 124/81  Pulse 76  Temp(Src) 98.1 F (36.7 C) (Oral)  SpO2 100% Physical Exam  Nursing note and vitals reviewed. Constitutional: She is oriented to  person, place, and time. She appears well-developed and well-nourished. No distress.  HENT:  Mouth/Throat: Oropharynx is clear and moist. No oropharyngeal exudate.  Eyes: Conjunctivae and EOM are normal. Pupils are equal, round, and reactive to light.  Neck: Normal range of motion. Neck supple.  Cardiovascular: Normal rate, regular rhythm and normal heart sounds.   Pulmonary/Chest: Effort normal and breath sounds normal. No respiratory distress. She has no wheezes. She has no rales.  Abdominal: Soft. She exhibits no distension. There is no tenderness.  Musculoskeletal: Normal range of motion. She exhibits no edema and no tenderness.  Normal muscle strength 5 over 5 bilaterally  Neurological: She is alert and oriented to person, place, and time. She has normal strength. She displays no tremor. No cranial nerve deficit or sensory deficit. She exhibits normal muscle tone.  Skin: Skin is warm and dry.  Psychiatric:  Although the R.N. reported some slowness in verbal response the patient was more alert and responsive during her interview. Her speech was clear and coherent. She followed commands well. Depressed affect.    ED Course  Procedures (including critical care time) Labs Review Labs Reviewed  POCT I-STAT, CHEM 8 - Abnormal; Notable for the following:    Calcium, Ion 1.34 (*)    Hemoglobin 16.3 (*)    HCT 48.0 (*)    All other components within normal limits   Results for orders placed during the hospital encounter of 01/13/13  POCT I-STAT, CHEM 8      Result Value Range   Sodium 143  135 - 145 mEq/L   Potassium 4.7  3.5 - 5.1 mEq/L   Chloride 110  96 - 112 mEq/L   BUN 18  6 - 23 mg/dL   Creatinine, Ser 1.61  0.50 - 1.10 mg/dL   Glucose, Bld 97  70 - 99 mg/dL   Calcium, Ion 0.96 (*) 1.12 - 1.23 mmol/L   TCO2 24  0 - 100 mmol/L   Hemoglobin 16.3 (*) 12.0 - 15.0 g/dL   HCT 04.5 (*) 40.9 - 81.1 %    Imaging Review No results found.  MDM   1. Dizziness   2. Weakness   3.  Dehydration     Patient has not been drinking  sufficient volume of fluids in thepast several days. Her H&H are elevated and I suspect it is due to low fluid line. She is instructed to drink plenty of fluids over the next 2-3 days. Recommended at least a quart and  a half per day. Majoring fruit juices, water, Gatorade, Pedialyte some soft drinks. She is given a gastritis to some of the drinks that she can have for oral rehydration. She does get occasionally have nausea so she is prescribed Zofran 4 mg by mouth for that.   do not take any more topiramate and she agrees. To followup with her PCP in a call for appointment. Recheck promptly for any new symptoms problems or worsening.      Hayden Rasmussen, NP 01/13/13 1148  Hayden Rasmussen, NP 01/13/13 1149

## 2013-01-13 NOTE — ED Provider Notes (Signed)
Medical screening examination/treatment/procedure(s) were performed by a resident physician or non-physician practitioner and as the supervising physician I was immediately available for consultation/collaboration.  [name], MD   Rodolph Bong, MD 01/13/13 [phone]

## 2013-01-13 NOTE — ED Notes (Addendum)
Pt is here with vague complaints of dizziness and fatigue.  Pt's reason for visit was low BP, however BP now 124/81.  Pt seems slow to answer questions, appears sleepy.  Reports history of migraines and states her PCP is aware of current symptoms and that she was seen by him recently and instructed to return in one month.

## 2013-01-14 ENCOUNTER — Emergency Department (HOSPITAL_COMMUNITY)
Admission: EM | Admit: 2013-01-14 | Discharge: 2013-01-14 | Disposition: A | Discharge status: Home / Self Care | Payer: Medicaid Other | Attending: Emergency Medicine | Admitting: Emergency Medicine

## 2013-01-14 ENCOUNTER — Encounter (HOSPITAL_COMMUNITY): Payer: Self-pay

## 2013-01-14 ENCOUNTER — Emergency Department (HOSPITAL_COMMUNITY): Payer: Medicaid Other

## 2013-01-14 DIAGNOSIS — Z8719 Personal history of other diseases of the digestive system: Secondary | ICD-10-CM | POA: Insufficient documentation

## 2013-01-14 DIAGNOSIS — Z86718 Personal history of other venous thrombosis and embolism: Secondary | ICD-10-CM | POA: Insufficient documentation

## 2013-01-14 DIAGNOSIS — G43019 Migraine without aura, intractable, without status migrainosus: Secondary | ICD-10-CM | POA: Insufficient documentation

## 2013-01-14 DIAGNOSIS — H81399 Other peripheral vertigo, unspecified ear: Secondary | ICD-10-CM

## 2013-01-14 DIAGNOSIS — G44009 Cluster headache syndrome, unspecified, not intractable: Secondary | ICD-10-CM | POA: Insufficient documentation

## 2013-01-14 DIAGNOSIS — Z79899 Other long term (current) drug therapy: Secondary | ICD-10-CM | POA: Insufficient documentation

## 2013-01-14 DIAGNOSIS — IMO0002 Reserved for concepts with insufficient information to code with codable children: Secondary | ICD-10-CM | POA: Insufficient documentation

## 2013-01-14 DIAGNOSIS — R5381 Other malaise: Secondary | ICD-10-CM | POA: Insufficient documentation

## 2013-01-14 DIAGNOSIS — G8929 Other chronic pain: Secondary | ICD-10-CM | POA: Insufficient documentation

## 2013-01-14 DIAGNOSIS — Z8739 Personal history of other diseases of the musculoskeletal system and connective tissue: Secondary | ICD-10-CM | POA: Insufficient documentation

## 2013-01-14 DIAGNOSIS — F411 Generalized anxiety disorder: Secondary | ICD-10-CM | POA: Insufficient documentation

## 2013-01-14 HISTORY — DX: Dizziness and giddiness: R42

## 2013-01-14 LAB — CBC WITH DIFFERENTIAL/PLATELET
Basophils Absolute: 0 10*3/uL (ref 0.0–0.1)
HCT: 40.2 % (ref 36.0–46.0)
Lymphocytes Relative: 41 % (ref 12–46)
Monocytes Absolute: 0.3 10*3/uL (ref 0.1–1.0)
Neutro Abs: 2.4 10*3/uL (ref 1.7–7.7)
Neutrophils Relative %: 51 % (ref 43–77)
Platelets: 308 10*3/uL (ref 150–400)
RDW: 13.2 % (ref 11.5–15.5)
WBC: 4.7 10*3/uL (ref 4.0–10.5)

## 2013-01-14 LAB — URINALYSIS W MICROSCOPIC + REFLEX CULTURE
Glucose, UA: NEGATIVE mg/dL
Leukocytes, UA: NEGATIVE
Specific Gravity, Urine: 1.032 — ABNORMAL HIGH (ref 1.005–1.030)
pH: 5.5 (ref 5.0–8.0)

## 2013-01-14 LAB — BASIC METABOLIC PANEL
CO2: 24 mEq/L (ref 19–32)
Chloride: 105 mEq/L (ref 96–112)
Potassium: 4 mEq/L (ref 3.5–5.1)
Sodium: 138 mEq/L (ref 135–145)

## 2013-01-14 MED ORDER — SODIUM CHLORIDE 0.9 % IV SOLN
INTRAVENOUS | Status: DC
  Administered 2013-01-14: 11:00:00 via INTRAVENOUS

## 2013-01-14 MED ORDER — MECLIZINE HCL 25 MG PO TABS
25.0000 mg | ORAL_TABLET | Freq: Once | ORAL | Status: AC
  Administered 2013-01-14: 25 mg via ORAL
  Filled 2013-01-14: qty 1

## 2013-01-14 MED ORDER — SODIUM CHLORIDE 0.9 % IV BOLUS (SEPSIS)
500.0000 mL | Freq: Once | INTRAVENOUS | Status: AC
  Administered 2013-01-14: 500 mL via INTRAVENOUS

## 2013-01-14 MED ORDER — PROMETHAZINE HCL 25 MG PO TABS: 25.0000 mg | ORAL_TABLET | Freq: Four times a day (QID) | ORAL | Status: DC | PRN

## 2013-01-14 MED ORDER — ONDANSETRON HCL 4 MG/2ML IJ SOLN
4.0000 mg | INTRAMUSCULAR | Status: DC | PRN
  Administered 2013-01-14: 4 mg via INTRAVENOUS
  Filled 2013-01-14: qty 2

## 2013-01-14 MED ORDER — IBUPROFEN 200 MG PO TABS
400.0000 mg | ORAL_TABLET | Freq: Once | ORAL | Status: AC
  Administered 2013-01-14: 400 mg via ORAL
  Filled 2013-01-14: qty 2

## 2013-01-14 MED ORDER — ACETAMINOPHEN 500 MG PO TABS
1000.0000 mg | ORAL_TABLET | Freq: Once | ORAL | Status: AC
  Administered 2013-01-14: 1000 mg via ORAL
  Filled 2013-01-14: qty 2

## 2013-01-14 NOTE — ED Notes (Signed)
md at bedside

## 2013-01-14 NOTE — ED Notes (Signed)
Pt escorted to discharge window. Pt verbalized understanding discharge instructions. In no acute distress.  

## 2013-01-14 NOTE — ED Notes (Signed)
Pt states seen at [facility] yesterday for same, pt c/o dizziness, weakness, numbness and migraine x2wks, states was told she was dehydrated. Pt denies n/v/d at this time.

## 2013-01-14 NOTE — ED Notes (Signed)
Pt ambulated independently. Pt reports she feels much better, only slightly "woozie" reports headache is still present but not dizziness.

## 2013-01-14 NOTE — ED Provider Notes (Signed)
CSN: [id number]     Arrival date & time 01/14/13  0830 History   First MD Initiated Contact with Patient 01/14/13 [phone]     Chief Complaint  Patient presents with  . Dizziness  . Weakness    HPI Pt was seen at 0840.  Per pt, c/o gradual onset and persistence of constant "dizziness" for the past 2 days. Pt describes the dizziness as "everything is moving." Endorses hx of same. States the dizziness improves when she sits down. Pt also c/o generalized weakness/fatigue for the past 2 weeks, as well as acute flair of her chronic migraine headaches. Pt states she has been evaluated by her PMD and [facility] for same (yesterday), was told she was "dehydrated" and to stop taking her topamax. Pt endorses previous hx of side effects when she took topamax (ie: fatigue, confusion, slow thinking). Denies CP/palpitations, no SOB/cough, no abd pain, no N/V/D. Describes the headache as per her usual chronic migraine headache pain pattern for many years.  Denies headache was sudden or maximal in onset or at any time.  Denies visual changes, no focal motor weakness, no tingling/numbness in extremities, no fevers, no neck pain, no rash.     Past Medical History  Diagnosis Date  . ANXIETY 11/16/2009  . CHEST WALL PAIN, ACUTE 04/16/2010  . FATIGUE 03/28/2009  . KNEE PAIN 02/04/2010  . MENTAL CONFUSION 11/16/2009  . MIGRAINE, COMMON W/INTRACTABLE MIGRAINE 03/28/2009  . Nausea alone 03/28/2009  . Hernia   . Phlebitis, complicating pregnancy or puerperium   . DVT (deep venous thrombosis)   . Vertigo    Past Surgical History  Procedure Laterality Date  . Hernia repair    . Cesarean section    . Endometrial ablation     Family History  Problem Relation Age of Onset  . Diabetes Other   . Hypertension Other   . Kidney disease Other   . Diabetes Mother   . Heart disease Mother   . Kidney disease Mother   . Hypertension Mother   . Stroke Mother    History  Substance Use Topics  . Smoking status: Never Smoker   .  Smokeless tobacco: Not on file  . Alcohol Use: No    Review of Systems ROS: Statement: All systems negative except as marked or noted in the HPI; Constitutional: +generalized weakness/fatigue. Negative for fever and chills. ; ; Eyes: Negative for eye pain, redness and discharge. ; ; ENMT: Negative for ear pain, hoarseness, nasal congestion, sinus pressure and sore throat. ; ; Cardiovascular: Negative for chest pain, palpitations, diaphoresis, dyspnea and peripheral edema. ; ; Respiratory: Negative for cough, wheezing and stridor. ; ; Gastrointestinal: Negative for nausea, vomiting, diarrhea, abdominal pain, blood in stool, hematemesis, jaundice and rectal bleeding. ; ; Genitourinary: Negative for dysuria, flank pain and hematuria. ; ; Musculoskeletal: Negative for back pain and neck pain. Negative for swelling and trauma.; ; Skin: Negative for pruritus, rash, abrasions, blisters, bruising and skin lesion.; ; Neuro: +"dizziness," chronic headache. Negative for lightheadedness and neck stiffness. Negative for weakness, altered level of consciousness , altered mental status, extremity weakness, paresthesias, involuntary movement, seizure and syncope.     Allergies  Topiramate  Home Medications   Current Outpatient Rx  Name  Route  Sig  Dispense  Refill  . amitriptyline (ELAVIL) 25 MG tablet   Oral   Take 1 tablet (25 mg total) by mouth at bedtime.   30 tablet   1   . hydrocortisone (ANUSOL-HC) 25 MG suppository  Rectal   Place 1 suppository (25 mg total) rectally 2 (two) times daily.   20 suppository   1   . ibuprofen (ADVIL,MOTRIN) 200 MG tablet   Oral   Take 400 mg by mouth every 6 (six) hours as needed. For headache         . Linaclotide (LINZESS) 145 MCG CAPS   Oral   Take 1 capsule (145 mcg total) by mouth daily as needed.   30 capsule   11   . ondansetron (ZOFRAN) 4 MG tablet   Oral   Take 1 tablet (4 mg total) by mouth every 6 (six) hours.   12 tablet   0   .  triamcinolone ointment (KENALOG) 0.5 %   Topical   Apply topically 2 (two) times daily.   15 g   1   . zolmitriptan (ZOMIG) 5 MG tablet   Oral   Take 5 mg by mouth as needed.           BP 120/73  Pulse 81  Temp(Src) 98.1 F (36.7 C) (Oral)  Resp 20  SpO2 100% Physical Exam 0845: Physical examination:  Nursing notes reviewed; Vital signs and O2 SAT reviewed;  Constitutional: Well developed, Well nourished, Well hydrated, In no acute distress; Head:  Normocephalic, atraumatic; Eyes: EOMI, PERRL, No scleral icterus; ENMT: TM's clear bilat. +edemetous nasal turbinates bilat with clear rhinorrhea.  Mouth and pharynx normal, Mucous membranes moist; Neck: Supple, Full range of motion, No lymphadenopathy; Cardiovascular: Regular rate and rhythm, No murmur, rub, or gallop; Respiratory: Breath sounds clear & equal bilaterally, No rales, rhonchi, wheezes.  Speaking full sentences with ease, Normal respiratory effort/excursion; Chest: Nontender, Movement normal; Abdomen: Soft, Nontender, Nondistended, Normal bowel sounds; Genitourinary: No CVA tenderness; Extremities: Pulses normal, No tenderness, No edema, No calf edema or asymmetry.; Neuro: AA&Ox3, Major CN grossly intact.  Strength 5/5 equal bilat UE's and LE's.  DTR 2/4 equal bilat UE's and LE's.  No gross sensory deficits.  Normal cerebellar testing bilat UE's (finger-nose) and LE's (heel-shin). Speech clear.  No facial droop. Gait steady. Climbs on and off stretcher easily by herself. +right horizontal end gaze fatigable nystagmus that reproduces pt's symptoms of "dizziness.";; Skin: Color normal, Warm, Dry.   ED Course  Procedures    MDM  MDM Reviewed: nursing note, vitals and previous chart Reviewed previous: labs Interpretation: labs and CT scan     Results for orders placed during the hospital encounter of 01/14/13  CBC WITH DIFFERENTIAL      Result Value Range   WBC 4.7  4.0 - 10.5 K/uL   RBC 4.73  3.87 - 5.11 MIL/uL    Hemoglobin 13.4  12.0 - 15.0 g/dL   HCT 16.1  09.6 - 04.5 %   MCV 85.0  78.0 - 100.0 fL   MCH 28.3  26.0 - 34.0 pg   MCHC 33.3  30.0 - 36.0 g/dL   RDW 40.9  81.1 - 91.4 %   Platelets 308  150 - 400 K/uL   Neutrophils Relative % 51  43 - 77 %   Neutro Abs 2.4  1.7 - 7.7 K/uL   Lymphocytes Relative 41  12 - 46 %   Lymphs Abs 1.9  0.7 - 4.0 K/uL   Monocytes Relative 7  3 - 12 %   Monocytes Absolute 0.3  0.1 - 1.0 K/uL   Eosinophils Relative 1  0 - 5 %   Eosinophils Absolute 0.1  0.0 - 0.7 K/uL  Basophils Relative 0  0 - 1 %   Basophils Absolute 0.0  0.0 - 0.1 K/uL  BASIC METABOLIC PANEL      Result Value Range   Sodium 138  135 - 145 mEq/L   Potassium 4.0  3.5 - 5.1 mEq/L   Chloride 105  96 - 112 mEq/L   CO2 24  19 - 32 mEq/L   Glucose, Bld 101 (*) 70 - 99 mg/dL   BUN 17  6 - 23 mg/dL   Creatinine, Ser 4.54  0.50 - 1.10 mg/dL   Calcium 9.6  8.4 - 09.8 mg/dL   GFR calc non Af Amer >90  >90 mL/min   GFR calc Af Amer >90  >90 mL/min  URINALYSIS W MICROSCOPIC + REFLEX CULTURE      Result Value Range   Color, Urine YELLOW  YELLOW   APPearance CLEAR  CLEAR   Specific Gravity, Urine 1.032 (*) 1.005 - 1.030   pH 5.5  5.0 - 8.0   Glucose, UA NEGATIVE  NEGATIVE mg/dL   Hgb urine dipstick NEGATIVE  NEGATIVE   Bilirubin Urine NEGATIVE  NEGATIVE   Ketones, ur NEGATIVE  NEGATIVE mg/dL   Protein, ur NEGATIVE  NEGATIVE mg/dL   Urobilinogen, UA 1.0  0.0 - 1.0 mg/dL   Nitrite NEGATIVE  NEGATIVE   Leukocytes, UA NEGATIVE  NEGATIVE   WBC, UA 0-2  <3 WBC/hpf   Bacteria, UA RARE  RARE   Squamous Epithelial / LPF RARE  RARE   Urine-Other MUCOUS PRESENT     Ct Head Wo Contrast 01/14/2013   *RADIOLOGY REPORT*  Clinical Data: Dizziness with headaches; altered mental status  CT HEAD WITHOUT CONTRAST  Technique:  Contiguous axial images were obtained from the base of the skull through the vertex without contrast. Study was obtained within 24 hours of patient arrival at the emergency department.   Comparison: Brain CT July 25, 2006 and brain MRI March 29, 2009  Findings: The ventricles are normal in size and configuration. There is no mass, hemorrhage, extra-axial fluid collection, or midline shift.  Gray-white compartments are normal.  No acute infarct demonstrated.  Bony calvarium appears intact.  The mastoid air cells are clear.  IMPRESSION: Study within normal limits.   Original Report Authenticated By: Bretta Bang, M.D.    1115:  Pt states the dizziness "feels better" after antivert. Pt has ambulated with steady gait, easy resps. VS remain stable. Tol PO well without N/V. Requests "something more for my headache" but is driving herself home, so will not tx with sedating meds. Pt verb understanding. Will tx now with tylenol and motrin, rx phenergan prn (which will also tx vertigo symptoms). Pt wants to go home now. Dx and testing d/w pt.  Questions answered.  Verb understanding, agreeable to d/c home with outpt f/u.   Laray Anger, DO 01/16/13 1215

## 2013-01-14 NOTE — ED Notes (Signed)
md at bedside  Pt alert and oriented x4. Respirations even and unlabored, bilateral symmetrical rise and fall of chest. Skin warm and dry. In no acute distress. Denies needs.   

## 2013-01-14 NOTE — ED Notes (Signed)
Per md pt allowed to have graham crackers

## 2013-01-20 ENCOUNTER — Encounter: Payer: Self-pay | Admitting: Obstetrics

## 2013-01-31 ENCOUNTER — Ambulatory Visit (INDEPENDENT_AMBULATORY_CARE_PROVIDER_SITE_OTHER): Payer: Medicaid Other | Admitting: Obstetrics & Gynecology

## 2013-01-31 ENCOUNTER — Telehealth: Payer: Self-pay | Admitting: Internal Medicine

## 2013-01-31 ENCOUNTER — Encounter: Payer: Self-pay | Admitting: Obstetrics & Gynecology

## 2013-01-31 VITALS — BP 122/98 | Temp 97.5°F | Ht 63.0 in | Wt 126.0 lb

## 2013-01-31 DIAGNOSIS — Z23 Encounter for immunization: Secondary | ICD-10-CM

## 2013-01-31 DIAGNOSIS — Z0000 Encounter for general adult medical examination without abnormal findings: Secondary | ICD-10-CM

## 2013-01-31 DIAGNOSIS — Z113 Encounter for screening for infections with a predominantly sexual mode of transmission: Secondary | ICD-10-CM

## 2013-01-31 NOTE — Progress Notes (Signed)
Subjective:     Bailey Patterson is a 52 y.o. female here for a routine exam.  Current complaints: Patient is in the office for annual exam- patient states she has been diagnosed with migraines.  Personal health questionnaire reviewed: no.   Gynecologic History No LMP recorded. Patient has had an ablation. Contraception: tubal ligation Last Pap: 1 year. Results were: normal Last mammogram: 01/2013. Results were: normal  Obstetric History OB History  No data available     The following portions of the patient's history were reviewed and updated as appropriate: allergies, current medications, past family history, past medical history, past social history, past surgical history and problem list.  Review of Systems Pertinent items are noted in HPI.    Objective:      General appearance: alert Breasts: normal appearance, no masses or tenderness Abdomen: soft, non-tender; bowel sounds normal; no masses,  no organomegaly Pelvic: cervix normal in appearance, external genitalia normal, no adnexal masses or tenderness, uterus normal size, shape, and consistency and vagina normal without discharge      Assessment:    Healthy female exam.    Plan:   Return prn

## 2013-01-31 NOTE — Telephone Encounter (Signed)
There is no 70 mg dose. Did you mean 20 mg? Thx

## 2013-01-31 NOTE — Telephone Encounter (Signed)
Pt request refill for Amitriptyline 10 mg. Pt request to increase from 10 mg to 70 mg. Please advise.

## 2013-01-31 NOTE — Patient Instructions (Addendum)

## 2013-01-31 NOTE — Telephone Encounter (Signed)
Please advise 

## 2013-02-01 ENCOUNTER — Encounter: Payer: Self-pay | Admitting: Obstetrics & Gynecology

## 2013-02-01 NOTE — Telephone Encounter (Signed)
Called pt  No answer  Will try again later

## 2013-02-02 LAB — PAP IG, CT-NG, RFX HPV ASCU
Chlamydia Probe Amp: NEGATIVE
GC Probe Amp: NEGATIVE

## 2013-03-03 ENCOUNTER — Ambulatory Visit: Payer: Self-pay | Admitting: Obstetrics

## 2013-04-14 ENCOUNTER — Other Ambulatory Visit: Payer: Self-pay | Admitting: Internal Medicine

## 2013-05-19 ENCOUNTER — Encounter (HOSPITAL_COMMUNITY): Payer: Self-pay | Admitting: Emergency Medicine

## 2013-05-19 ENCOUNTER — Emergency Department (HOSPITAL_COMMUNITY)
Admission: EM | Admit: 2013-05-19 | Discharge: 2013-05-19 | Disposition: A | Discharge status: Home / Self Care | Payer: Medicaid Other | Attending: Emergency Medicine | Admitting: Emergency Medicine

## 2013-05-19 DIAGNOSIS — Z86718 Personal history of other venous thrombosis and embolism: Secondary | ICD-10-CM | POA: Insufficient documentation

## 2013-05-19 DIAGNOSIS — Y929 Unspecified place or not applicable: Secondary | ICD-10-CM | POA: Insufficient documentation

## 2013-05-19 DIAGNOSIS — Z8672 Personal history of thrombophlebitis: Secondary | ICD-10-CM | POA: Insufficient documentation

## 2013-05-19 DIAGNOSIS — Z8739 Personal history of other diseases of the musculoskeletal system and connective tissue: Secondary | ICD-10-CM | POA: Insufficient documentation

## 2013-05-19 DIAGNOSIS — S00452A Superficial foreign body of left ear, initial encounter: Secondary | ICD-10-CM

## 2013-05-19 DIAGNOSIS — IMO0002 Reserved for concepts with insufficient information to code with codable children: Secondary | ICD-10-CM | POA: Insufficient documentation

## 2013-05-19 DIAGNOSIS — F411 Generalized anxiety disorder: Secondary | ICD-10-CM | POA: Insufficient documentation

## 2013-05-19 DIAGNOSIS — G43019 Migraine without aura, intractable, without status migrainosus: Secondary | ICD-10-CM | POA: Insufficient documentation

## 2013-05-19 DIAGNOSIS — R42 Dizziness and giddiness: Secondary | ICD-10-CM | POA: Insufficient documentation

## 2013-05-19 DIAGNOSIS — Y9389 Activity, other specified: Secondary | ICD-10-CM | POA: Insufficient documentation

## 2013-05-19 DIAGNOSIS — H9209 Otalgia, unspecified ear: Secondary | ICD-10-CM | POA: Insufficient documentation

## 2013-05-19 DIAGNOSIS — Z8659 Personal history of other mental and behavioral disorders: Secondary | ICD-10-CM | POA: Insufficient documentation

## 2013-05-19 DIAGNOSIS — H9319 Tinnitus, unspecified ear: Secondary | ICD-10-CM | POA: Insufficient documentation

## 2013-05-19 DIAGNOSIS — T169XXA Foreign body in ear, unspecified ear, initial encounter: Secondary | ICD-10-CM | POA: Insufficient documentation

## 2013-05-19 DIAGNOSIS — Z8719 Personal history of other diseases of the digestive system: Secondary | ICD-10-CM | POA: Insufficient documentation

## 2013-05-19 DIAGNOSIS — Z79899 Other long term (current) drug therapy: Secondary | ICD-10-CM | POA: Insufficient documentation

## 2013-05-19 NOTE — ED Notes (Signed)
Pt reports she has cotton from a q-tip stuck in her L ear.

## 2013-05-19 NOTE — Discharge Instructions (Signed)
Try over the counter claritin or benadryl for itching in ears and vertigo.   You may return to the ER if symptoms worsen or you have any other concerns.

## 2013-05-19 NOTE — ED Provider Notes (Signed)
Medical screening examination/treatment/procedure(s) were performed by non-physician practitioner and as supervising physician I was immediately available for consultation/collaboration.  EKG Interpretation   None         Gwyneth SproutWhitney Davione Lenker, MD 05/19/13 2240

## 2013-05-19 NOTE — ED Provider Notes (Signed)
CSN: [id number]     Arrival date & time 05/19/13  1913 History  This chart was scribed for non-physician practitioner, Kyung BaccaKatie Mel Tadros, PA-C,working with Gwyneth SproutWhitney Plunkett, MD, by Karle PlumberJennifer Tensley, ED Scribe. This patient was seen in room WTR7/WLPT7 and the patient's care was started at 9:09 PM.  No chief complaint on file.  Patient is a 53 y.o. female presenting with foreign body in ear. The history is provided by the patient. No language interpreter was used.  Foreign Body in Ear   HPI Comments:  Bailey Patterson is a 53 y.o. female who presents to the Emergency Department complaining of a piece of Q-tip in her left ear. She states she was cleaning her ear this morning and the tip of the cotton swab came off in her ear. She reports associated scratchy pain. She states she will be seeing her PCP in four days for an ENT referral to follow up for h/o vertigo and migraines. She reports h/o allergies. She denies fever.   Past Medical History  Diagnosis Date  . ANXIETY 11/16/2009  . CHEST WALL PAIN, ACUTE 04/16/2010  . FATIGUE 03/28/2009  . KNEE PAIN 02/04/2010  . MENTAL CONFUSION 11/16/2009  . MIGRAINE, COMMON W/INTRACTABLE MIGRAINE 03/28/2009  . Nausea alone 03/28/2009  . Hernia   . Phlebitis, complicating pregnancy or puerperium   . DVT (deep venous thrombosis)   . Vertigo    Past Surgical History  Procedure Laterality Date  . Hernia repair    . Cesarean section    . Endometrial ablation     Family History  Problem Relation Age of Onset  . Diabetes Other   . Hypertension Other   . Kidney disease Other   . Diabetes Mother   . Heart disease Mother   . Kidney disease Mother   . Hypertension Mother   . Stroke Mother    History  Substance Use Topics  . Smoking status: Never Smoker   . Smokeless tobacco: Not on file  . Alcohol Use: No   OB History   Grav Para Term Preterm Abortions TAB SAB Ect Mult Living                 Review of Systems  Constitutional: Negative for fever.   HENT: Positive for ear pain (secondary to tip of Q-tip in ear).   All other systems reviewed and are negative.   Allergies  Topiramate  Home Medications   Current Outpatient Rx  Name  Route  Sig  Dispense  Refill  . amitriptyline (ELAVIL) 10 MG tablet      TAKE 1 TABLET AT BEDTIME   30 tablet   1   . ondansetron (ZOFRAN) 4 MG tablet   Oral   Take 1 tablet (4 mg total) by mouth every 6 (six) hours.   12 tablet   0   . promethazine (PHENERGAN) 25 MG tablet   Oral   Take 1 tablet (25 mg total) by mouth every 6 (six) hours as needed for nausea (dizziness or headache).   8 tablet   0   . zolmitriptan (ZOMIG) 5 MG tablet   Oral   Take 5 mg by mouth every 2 (two) hours as needed for migraine.           Triage Vitals: BP 125/96  Pulse 91  Temp(Src) 98 F (36.7 C) (Oral)  Resp 15  Ht 5\' 4"  (1.626 m)  Wt 123 lb (55.792 kg)  BMI 21.10 kg/m2  SpO2 100% Physical Exam  Nursing note and vitals reviewed. Constitutional: She is oriented to person, place, and time. She appears well-developed and well-nourished. No distress.  HENT:  Head: Normocephalic and atraumatic.  Cotton tip of qtip in L EAC.  Nml R EAC and TM.  Eyes:  Normal appearance  Neck: Normal range of motion.  Pulmonary/Chest: Effort normal.  Musculoskeletal: Normal range of motion.  Neurological: She is alert and oriented to person, place, and time.  Psychiatric: She has a normal mood and affect. Her behavior is normal.    ED Course  Procedures (including critical care time) DIAGNOSTIC STUDIES: Oxygen Saturation is 100% on RA, normal by my interpretation.   COORDINATION OF CARE: 9:15 PM- Will remove foreign body from left ear. Pt verbalizes understanding and agrees to plan.  Medications - No data to display  Labs Review Labs Reviewed - No data to display Imaging Review No results found.  EKG Interpretation   None       MDM   1. Foreign body in ear lobe, left, initial encounter    53yo  F w/ chronic tinnitus and vertigo as well as migraines presents w/ cotton tip of qtip in her L ear.  She was using a qtip for pruritis/ringing.  Cotton removed easily w/ tweezers.  TM and EAC otherwise unremarkable bilaterally.  Recommended benadryl and claritin for sx and advised against using qtips.  She has an appt scheduled w/ her PCP Monday and will be referred to ENT at that time.    I personally performed the services described in this documentation, which was scribed in my presence. The recorded information has been reviewed and is accurate.    Otilio Miu, PA-C 05/19/13 2130

## 2013-06-25 ENCOUNTER — Encounter (HOSPITAL_COMMUNITY): Payer: Self-pay | Admitting: Emergency Medicine

## 2013-06-25 ENCOUNTER — Emergency Department (HOSPITAL_COMMUNITY)
Admission: EM | Admit: 2013-06-25 | Discharge: 2013-06-25 | Disposition: A | Discharge status: Home / Self Care | Payer: Medicaid Other | Attending: Emergency Medicine | Admitting: Emergency Medicine

## 2013-06-25 DIAGNOSIS — Z86718 Personal history of other venous thrombosis and embolism: Secondary | ICD-10-CM | POA: Insufficient documentation

## 2013-06-25 DIAGNOSIS — R51 Headache: Secondary | ICD-10-CM

## 2013-06-25 DIAGNOSIS — Z8719 Personal history of other diseases of the digestive system: Secondary | ICD-10-CM | POA: Insufficient documentation

## 2013-06-25 DIAGNOSIS — IMO0001 Reserved for inherently not codable concepts without codable children: Secondary | ICD-10-CM | POA: Insufficient documentation

## 2013-06-25 DIAGNOSIS — R519 Headache, unspecified: Secondary | ICD-10-CM

## 2013-06-25 DIAGNOSIS — F19939 Other psychoactive substance use, unspecified with withdrawal, unspecified: Secondary | ICD-10-CM | POA: Insufficient documentation

## 2013-06-25 DIAGNOSIS — G43909 Migraine, unspecified, not intractable, without status migrainosus: Secondary | ICD-10-CM | POA: Insufficient documentation

## 2013-06-25 DIAGNOSIS — F411 Generalized anxiety disorder: Secondary | ICD-10-CM | POA: Insufficient documentation

## 2013-06-25 DIAGNOSIS — M797 Fibromyalgia: Secondary | ICD-10-CM

## 2013-06-25 MED ORDER — ONDANSETRON 4 MG PO TBDP
4.0000 mg | ORAL_TABLET | Freq: Once | ORAL | Status: AC
  Administered 2013-06-25: 4 mg via ORAL
  Filled 2013-06-25: qty 1

## 2013-06-25 MED ORDER — HYDROCODONE-ACETAMINOPHEN 5-325 MG PO TABS
1.0000 | ORAL_TABLET | Freq: Once | ORAL | Status: AC
Start: 2013-06-25 — End: 2013-06-25
  Administered 2013-06-25: 1 via ORAL
  Filled 2013-06-25: qty 1

## 2013-06-25 MED ORDER — AMITRIPTYLINE HCL 50 MG PO TABS: 50.0000 mg | ORAL_TABLET | Freq: Every day | ORAL | Status: DC

## 2013-06-25 NOTE — ED Provider Notes (Addendum)
CSN: [id number]     Arrival date & time 06/25/13  [id number] History   First MD Initiated Contact with Patient 06/25/13 [phone]     Chief Complaint  Patient presents with  . Fibromyalgia  . Migraine      HPI  Patient presents with a headache.  Bifrontal.  Described as a migraine.  Similar to past headaches.  She is having.  The last several years.  Also, has a fairly recent diagnosis of fibromyalgia given by her primary care physician.  She ran out of her amitriptyline that she takes at night 3 days ago.  The next day after sleeping poorly she developed diffuse body aches, and feeling like of feeling some arms, legs "electrical shock" feelings".  These are the symptoms are similar to what she experienced when she was given a diagnosis of fibromyalgia.  Several months ago.  Past Medical History  Diagnosis Date  . ANXIETY 11/16/2009  . CHEST WALL PAIN, ACUTE 04/16/2010  . FATIGUE 03/28/2009  . KNEE PAIN 02/04/2010  . MENTAL CONFUSION 11/16/2009  . MIGRAINE, COMMON W/INTRACTABLE MIGRAINE 03/28/2009  . Nausea alone 03/28/2009  . Hernia   . Phlebitis, complicating pregnancy or puerperium   . DVT (deep venous thrombosis)   . Vertigo    Past Surgical History  Procedure Laterality Date  . Hernia repair    . Cesarean section    . Endometrial ablation     Family History  Problem Relation Age of Onset  . Diabetes Other   . Hypertension Other   . Kidney disease Other   . Diabetes Mother   . Heart disease Mother   . Kidney disease Mother   . Hypertension Mother   . Stroke Mother    History  Substance Use Topics  . Smoking status: Never Smoker   . Smokeless tobacco: Not on file  . Alcohol Use: No   OB History   Grav Para Term Preterm Abortions TAB SAB Ect Mult Living                 Review of Systems  Constitutional: Negative for fever, chills, diaphoresis, appetite change and fatigue.  HENT: Negative for mouth sores, sore throat and trouble swallowing.   Eyes: Negative for visual  disturbance.  Respiratory: Negative for cough, chest tightness, shortness of breath and wheezing.   Cardiovascular: Negative for chest pain.  Gastrointestinal: Negative for nausea, vomiting, abdominal pain, diarrhea and abdominal distention.  Endocrine: Negative for polydipsia, polyphagia and polyuria.  Genitourinary: Negative for dysuria, frequency and hematuria.  Musculoskeletal: Negative for gait problem.  Skin: Negative for color change, pallor and rash.  Neurological: Negative for dizziness, syncope, light-headedness and headaches.  Hematological: Does not bruise/bleed easily.  Psychiatric/Behavioral: Negative for behavioral problems and confusion.      Allergies  Topiramate  Home Medications   Current Outpatient Rx  Name  Route  Sig  Dispense  Refill  . amitriptyline (ELAVIL) 10 MG tablet      TAKE 1 TABLET AT BEDTIME   30 tablet   1   . amitriptyline (ELAVIL) 50 MG tablet   Oral   Take 1 tablet (50 mg total) by mouth at bedtime.   30 tablet   1   . promethazine (PHENERGAN) 25 MG tablet   Oral   Take 1 tablet (25 mg total) by mouth every 6 (six) hours as needed for nausea (dizziness or headache).   8 tablet   0   . zolmitriptan (ZOMIG) 5 MG tablet  Oral   Take 5 mg by mouth every 2 (two) hours as needed for migraine.           There were no vitals taken for this visit. Physical Exam  Constitutional: She is oriented to person, place, and time. She appears well-developed and well-nourished. No distress.  HENT:  Head: Normocephalic.  Eyes: Conjunctivae are normal. Pupils are equal, round, and reactive to light. No scleral icterus.  Neck: Normal range of motion. Neck supple. No thyromegaly present.  Cardiovascular: Normal rate and regular rhythm.  Exam reveals no gallop and no friction rub.   No murmur heard. Pulmonary/Chest: Effort normal and breath sounds normal. No respiratory distress. She has no wheezes. She has no rales.  Abdominal: Soft. Bowel sounds  are normal. She exhibits no distension. There is no tenderness. There is no rebound.  Musculoskeletal: Normal range of motion.  Neurological: She is alert and oriented to person, place, and time.  Skin: Skin is warm and dry. No rash noted.  Psychiatric: She has a normal mood and affect. Her behavior is normal.    ED Course  Procedures (including critical care time) Labs Review Labs Reviewed - No data to display Imaging Review No results found.  EKG Interpretation   None       MDM   Final diagnoses:  Headache  Fibromyalgia  Medication withdrawal    I think a lot of her symptoms or exacerbations of chronic pain syndromes..  Pertinent, and worsening myalgias, started after stopping her Amitriptyline 3 nights ago.  She was given Norco and Zofran here for her headache.  I have given her refill on her dose of amitriptyline.  I discussed with her increasing her dose of her gabapentin by 1 tablet per day if she is not seeing Improvement of symptoms after 3-4, days.    Rolland Porter, MD 06/25/13 1002  Rolland Porter, MD 07/20/13 [id number]

## 2013-06-25 NOTE — Discharge Instructions (Signed)
Re start your Amitryptilene. Stopping it suddenly can cause headaches and worsening of you fibroyalgia pain. You can increase your Gabapentin dose by 1 tablet at night.  After 5 days, if your symptoms are not improved, and if you are not experiencing side effects, you can add 1 tablet to the mid-day dose.  After 5 days, you can add 1 tablet to the morning dosage.  Fibromyalgia Fibromyalgia is a disorder that is often misunderstood. It is associated with muscular pains and tenderness that comes and goes. It is often associated with fatigue and sleep disturbances. Though it tends to be long-lasting, fibromyalgia is not life-threatening. CAUSES  The exact cause of fibromyalgia is unknown. People with certain gene types are predisposed to developing fibromyalgia and other conditions. Certain factors can play a role as triggers, such as:  Spine disorders.  Arthritis.  Severe injury (trauma) and other physical stressors.  Emotional stressors. SYMPTOMS   The main symptom is pain and stiffness in the muscles and joints, which can vary over time.  Sleep and fatigue problems. Other related symptoms may include:  Bowel and bladder problems.  Headaches.  Visual problems.  Problems with odors and noises.  Depression or mood changes.  Painful periods (dysmenorrhea).  Dryness of the skin or eyes. DIAGNOSIS  There are no specific tests for diagnosing fibromyalgia. Patients can be diagnosed accurately from the specific symptoms they have. The diagnosis is made by determining that nothing else is causing the problems. TREATMENT  There is no cure. Management includes medicines and an active, healthy lifestyle. The goal is to enhance physical fitness, decrease pain, and improve sleep. HOME CARE INSTRUCTIONS   Only take over-the-counter or prescription medicines as directed by your caregiver. Sleeping pills, tranquilizers, and pain medicines may make your problems worse.  Low-impact aerobic  exercise is very important and advised for treatment. At first, it may seem to make pain worse. Gradually increasing your tolerance will overcome this feeling.  Learning relaxation techniques and how to control stress will help you. Biofeedback, visual imagery, hypnosis, muscle relaxation, yoga, and meditation are all options.  Anti-inflammatory medicines and physical therapy may provide short-term help.  Acupuncture or massage treatments may help.  Take muscle relaxant medicines as suggested by your caregiver.  Avoid stressful situations.  Plan a healthy lifestyle. This includes your diet, sleep, rest, exercise, and friends.  Find and practice a hobby you enjoy.  Join a fibromyalgia support group for interaction, ideas, and sharing advice. This may be helpful. SEEK MEDICAL CARE IF:  You are not having good results or improvement from your treatment. FOR MORE INFORMATION  National Fibromyalgia Association: www.fmaware.org Arthritis Foundation: www.arthritis.org Document Released: 04/28/2005 Document Revised: 07/21/2011 Document Reviewed: 08/08/2009 [facility] Patient Information 2014 [place], [place].  Migraine Headache A migraine headache is an intense, throbbing pain on one or both sides of your head. A migraine can last for 30 minutes to several hours. CAUSES  The exact cause of a migraine headache is not always known. However, a migraine may be caused when nerves in the brain become irritated and release chemicals that cause inflammation. This causes pain. Certain things may also trigger migraines, such as:  Alcohol.  Smoking.  Stress.  Menstruation.  Aged cheeses.  Foods or drinks that contain nitrates, glutamate, aspartame, or tyramine.  Lack of sleep.  Chocolate.  Caffeine.  Hunger.  Physical exertion.  Fatigue.  Medicines used to treat chest pain (nitroglycerine), birth control pills, estrogen, and some blood pressure medicines. SIGNS AND SYMPTOMS  Pain  on one or both sides of your head.  Pulsating or throbbing pain.  Severe pain that prevents daily activities.  Pain that is aggravated by any physical activity.  Nausea, vomiting, or both.  Dizziness.  Pain with exposure to bright lights, loud noises, or activity.  General sensitivity to bright lights, loud noises, or smells. Before you get a migraine, you may get warning signs that a migraine is coming (aura). An aura may include:  Seeing flashing lights.  Seeing bright spots, halos, or zig-zag lines.  Having tunnel vision or blurred vision.  Having feelings of numbness or tingling.  Having trouble talking.  Having muscle weakness. DIAGNOSIS  A migraine headache is often diagnosed based on:  Symptoms.  Physical exam.  A CT scan or MRI of your head. These imaging tests cannot diagnose migraines, but they can help rule out other causes of headaches. TREATMENT Medicines may be given for pain and nausea. Medicines can also be given to help prevent recurrent migraines.  HOME CARE INSTRUCTIONS  Only take over-the-counter or prescription medicines for pain or discomfort as directed by your health care provider. The use of long-term narcotics is not recommended.  Lie down in a dark, quiet room when you have a migraine.  Keep a journal to find out what may trigger your migraine headaches. For example, write down:  What you eat and drink.  How much sleep you get.  Any change to your diet or medicines.  Limit alcohol consumption.  Quit smoking if you smoke.  Get 7 9 hours of sleep, or as recommended by your health care provider.  Limit stress.  Keep lights dim if bright lights bother you and make your migraines worse. SEEK IMMEDIATE MEDICAL CARE IF:   Your migraine becomes severe.  You have a fever.  You have a stiff neck.  You have vision loss.  You have muscular weakness or loss of muscle control.  You start losing your balance or have trouble  walking.  You feel faint or pass out.  You have severe symptoms that are different from your first symptoms. MAKE SURE YOU:   Understand these instructions.  Will watch your condition.  Will get help right away if you are not doing well or get worse. Document Released: 04/28/2005 Document Revised: 02/16/2013 Document Reviewed: 01/03/2013 [facility] Patient Information 2014 [place], [place].

## 2013-06-25 NOTE — ED Notes (Signed)
Pt from home c/o fibromyalgia pain with migraine x1 week. Pt meds have not been relieving pain and pt reports that her PCP will not return her calls. Pt is A&O and in NAD

## 2013-07-04 ENCOUNTER — Ambulatory Visit: Payer: Medicaid Other | Admitting: Internal Medicine

## 2013-07-13 ENCOUNTER — Encounter (HOSPITAL_COMMUNITY): Payer: Self-pay | Admitting: Emergency Medicine

## 2013-07-13 ENCOUNTER — Emergency Department (HOSPITAL_COMMUNITY)
Admission: EM | Admit: 2013-07-13 | Discharge: 2013-07-13 | Disposition: A | Discharge status: Home / Self Care | Payer: Medicaid Other | Attending: Emergency Medicine | Admitting: Emergency Medicine

## 2013-07-13 DIAGNOSIS — Y929 Unspecified place or not applicable: Secondary | ICD-10-CM | POA: Insufficient documentation

## 2013-07-13 DIAGNOSIS — T622X1A Toxic effect of other ingested (parts of) plant(s), accidental (unintentional), initial encounter: Secondary | ICD-10-CM | POA: Insufficient documentation

## 2013-07-13 DIAGNOSIS — T781XXA Other adverse food reactions, not elsewhere classified, initial encounter: Secondary | ICD-10-CM

## 2013-07-13 DIAGNOSIS — Z86718 Personal history of other venous thrombosis and embolism: Secondary | ICD-10-CM | POA: Insufficient documentation

## 2013-07-13 DIAGNOSIS — Z8719 Personal history of other diseases of the digestive system: Secondary | ICD-10-CM | POA: Insufficient documentation

## 2013-07-13 DIAGNOSIS — Z79899 Other long term (current) drug therapy: Secondary | ICD-10-CM | POA: Insufficient documentation

## 2013-07-13 DIAGNOSIS — G43019 Migraine without aura, intractable, without status migrainosus: Secondary | ICD-10-CM | POA: Insufficient documentation

## 2013-07-13 DIAGNOSIS — R209 Unspecified disturbances of skin sensation: Secondary | ICD-10-CM | POA: Insufficient documentation

## 2013-07-13 DIAGNOSIS — F411 Generalized anxiety disorder: Secondary | ICD-10-CM | POA: Insufficient documentation

## 2013-07-13 DIAGNOSIS — IMO0001 Reserved for inherently not codable concepts without codable children: Secondary | ICD-10-CM | POA: Insufficient documentation

## 2013-07-13 DIAGNOSIS — Y9389 Activity, other specified: Secondary | ICD-10-CM | POA: Insufficient documentation

## 2013-07-13 DIAGNOSIS — R21 Rash and other nonspecific skin eruption: Secondary | ICD-10-CM | POA: Insufficient documentation

## 2013-07-13 DIAGNOSIS — T621X1A Toxic effect of ingested berries, accidental (unintentional), initial encounter: Secondary | ICD-10-CM | POA: Insufficient documentation

## 2013-07-13 HISTORY — DX: Constipation, unspecified: K59.00

## 2013-07-13 HISTORY — DX: Fibromyalgia: M79.7

## 2013-07-13 MED ORDER — FAMOTIDINE 20 MG PO TABS: 20.0000 mg | ORAL_TABLET | Freq: Two times a day (BID) | ORAL | Status: DC

## 2013-07-13 MED ORDER — PREDNISONE 20 MG PO TABS: 60.0000 mg | ORAL_TABLET | Freq: Every day | ORAL | Status: DC

## 2013-07-13 MED ORDER — DIPHENHYDRAMINE HCL 25 MG PO TABS: 25.0000 mg | ORAL_TABLET | Freq: Four times a day (QID) | ORAL | Status: DC

## 2013-07-13 NOTE — ED Notes (Signed)
Pt states that she thought she wasn't allergic to blueberries anymore but ate some today and is now having facial swelling. Ate the blueberries around 3oclock and started swelling around 4:30pm. Airway intact.

## 2013-07-13 NOTE — ED Notes (Signed)
Pt ambulatory to exam room with steady gait. Pt with no acute distress.  

## 2013-07-13 NOTE — ED Provider Notes (Signed)
CSN: [id number]     Arrival date & time 07/13/13  2010 History  This chart was scribed for non-physician practitioner, Elpidio Anis, PA-C,working with Flint Melter, MD, by Karle Plumber, ED Scribe.  This patient was seen in room WTR7/WTR7 and the patient's care was started at 10:00 PM.  Chief Complaint  Patient presents with  . Allergic Reaction   The history is provided by the patient. No language interpreter was used.   HPI Comments:  Bailey Patterson is a 53 y.o. female who presents to the Emergency Department complaining of a tingling facial rash secondary to an allergic reaction to blueberries. She states she had some blueberry pie approximately 8 hours ago. She denies taking anything for the symptoms. She denies itching, trouble breathing or swallowing. She reports h/o fibromyalgia. She denies h/o DM.   Past Medical History  Diagnosis Date  . ANXIETY 11/16/2009  . CHEST WALL PAIN, ACUTE 04/16/2010  . FATIGUE 03/28/2009  . KNEE PAIN 02/04/2010  . MENTAL CONFUSION 11/16/2009  . MIGRAINE, COMMON W/INTRACTABLE MIGRAINE 03/28/2009  . Nausea alone 03/28/2009  . Hernia   . Phlebitis, complicating pregnancy or puerperium   . DVT (deep venous thrombosis)   . Vertigo   . Fibromyalgia   . Constipation    Past Surgical History  Procedure Laterality Date  . Hernia repair    . Cesarean section    . Endometrial ablation     Family History  Problem Relation Age of Onset  . Diabetes Other   . Hypertension Other   . Kidney disease Other   . Diabetes Mother   . Heart disease Mother   . Kidney disease Mother   . Hypertension Mother   . Stroke Mother    History  Substance Use Topics  . Smoking status: Never Smoker   . Smokeless tobacco: Not on file  . Alcohol Use: No   OB History   Grav Para Term Preterm Abortions TAB SAB Ect Mult Living                 Review of Systems  Constitutional: Negative for fever.  Skin: Positive for rash.  All other systems reviewed and are  negative.    Allergies  Blueberry; Grapeseed extract; and Topiramate  Home Medications   Current Outpatient Rx  Name  Route  Sig  Dispense  Refill  . amitriptyline (ELAVIL) 50 MG tablet   Oral   Take 1 tablet (50 mg total) by mouth at bedtime.   30 tablet   1   . gabapentin (NEURONTIN) 100 MG capsule   Oral   Take 100 mg by mouth 3 (three) times daily.          Triage Vitals: BP 131/92  Pulse 100  Temp(Src) 97.9 F (36.6 C) (Oral)  SpO2 99% Physical Exam  Nursing note and vitals reviewed. Constitutional: She is oriented to person, place, and time. She appears well-developed and well-nourished.  HENT:  Head: Normocephalic and atraumatic.  Mouth/Throat: Uvula is midline, oropharynx is clear and moist and mucous membranes are normal. No posterior oropharyngeal edema or posterior oropharyngeal erythema.  Eyes: EOM are normal.  Neck: Normal range of motion.  Cardiovascular: Normal rate, regular rhythm and normal heart sounds.  Exam reveals no gallop and no friction rub.   No murmur heard. Pulmonary/Chest: Effort normal and breath sounds normal. No stridor. No respiratory distress. She has no wheezes. She has no rales. She exhibits no tenderness.  Musculoskeletal: Normal range of motion.  Lymphadenopathy:    She has no cervical adenopathy.  Neurological: She is alert and oriented to person, place, and time.  Skin: Skin is warm and dry. Rash noted. There is erythema.  Small, nodular, erythematous rash limited to the face.   Psychiatric: She has a normal mood and affect. Her behavior is normal.    ED Course  Procedures (including critical care time) DIAGNOSTIC STUDIES: Oxygen Saturation is 99% on RA, normal by my interpretation.   COORDINATION OF CARE: 10:04 PM- Will prescribe Benadryl, Prednisone, and Pepcid. Pt verbalizes understanding and agrees to plan.  Medications - No data to display  Labs Review Labs Reviewed - No data to display Imaging Review No results  found.   EKG Interpretation None      MDM   Final diagnoses:  None    1. Allergic reaction to food.  Uncomplicated reaction to blueberries limited to facial rash.   I personally performed the services described in this documentation, which was scribed in my presence. The recorded information has been reviewed and is accurate.    Arnoldo HookerShari A Steele Stracener, PA-C 07/13/13 2210

## 2013-07-13 NOTE — ED Provider Notes (Signed)
Medical screening examination/treatment/procedure(s) were performed by non-physician practitioner and as supervising physician I was immediately available for consultation/collaboration.  Joanathan Affeldt L Libia Fazzini, MD 07/13/13 2359 

## 2013-07-13 NOTE — Discharge Instructions (Signed)
Food Allergy °A food allergy occurs from eating something you are sensitive to. Food allergies occur in all age groups. It may be passed to you from your parents (heredity).  °CAUSES  °Some common causes are cow's milk, seafood, eggs, nuts (including peanut butter), wheat, and soybeans. °SYMPTOMS  °Common problems are:  °· Swelling around the mouth. °· An itchy, red rash. °· Hives. °· Vomiting. °· Diarrhea. °Severe allergic reactions are life-threatening. This reaction is called anaphylaxis. It can cause the mouth and throat to swell. This makes it hard to breathe and swallow. In severe reactions, only a small amount of food may be fatal within seconds. °HOME CARE INSTRUCTIONS  °· If you are unsure what caused the reaction, keep a diary of foods eaten and symptoms that followed. Avoid foods that cause reactions. °· If hives or rash are present: °· Take medicines as directed. °· Use an over-the-counter antihistamine (diphenhydramine) to treat hives and itching as needed. °· Apply cold compresses to the skin or take baths in cool water. Avoid hot baths or showers. These will increase the redness and itching. °· If you are severely allergic: °· Hospitalization is often required following a severe reaction. °· Wear a medical alert bracelet or necklace that describes the allergy. °· Carry your anaphylaxis kit or epinephrine injection with you at all times. Both you and your family members should know how to use this. This can be lifesaving if you have a severe reaction. If epinephrine is used, it is important for you to seek immediate medical care or call your local emergency services (911 in [place] When the epinephrine wears off, it can be followed by a delayed reaction, which can be fatal. °· Replace your epinephrine immediately after use in case of another reaction. °· Ask your caregiver for instructions if you have not been taught how to use an epinephrine injection. °· Do not drive until medicines used to treat the  reaction have worn off, unless approved by your caregiver. °SEEK MEDICAL CARE IF:  °· You suspect a food allergy. Symptoms generally happen within 30 minutes of eating a food. °· Your symptoms have not gone away within 2 days. See your caregiver sooner if symptoms are getting worse. °· You develop new symptoms. °· You want to retest yourself with a food or drink you think causes an allergic reaction. Never do this if an anaphylactic reaction to that food or drink has happened before. °· There is a return of the symptoms which brought you to your caregiver. °SEEK IMMEDIATE MEDICAL CARE IF:  °· You have trouble breathing, are wheezing, or you have a tight feeling in your chest or throat. °· You have a swollen mouth, or you have hives, swelling, or itching all over your body. Use your epinephrine injection immediately. This is given into the outside of your thigh, deep into the muscle. Following use of the epinephrine injection, seek help right away. °Seek immediate medical care or call your local emergency services (911 in [place] °MAKE SURE YOU:  °· Understand these instructions. °· Will watch your condition. °· Will get help right away if you are not doing well or get worse. °Document Released: 04/25/2000 Document Revised: 07/21/2011 Document Reviewed: 12/16/2007 °Exit[employer]® Patient Information ©2014 [employer]. ° °

## 2013-11-06 ENCOUNTER — Emergency Department (HOSPITAL_COMMUNITY): Payer: Medicaid Other

## 2013-11-06 ENCOUNTER — Emergency Department (HOSPITAL_COMMUNITY)
Admission: EM | Admit: 2013-11-06 | Discharge: 2013-11-06 | Disposition: A | Discharge status: Home / Self Care | Payer: Medicaid Other | Attending: Emergency Medicine | Admitting: Emergency Medicine

## 2013-11-06 ENCOUNTER — Encounter (HOSPITAL_COMMUNITY): Payer: Self-pay | Admitting: Emergency Medicine

## 2013-11-06 DIAGNOSIS — Z86718 Personal history of other venous thrombosis and embolism: Secondary | ICD-10-CM | POA: Insufficient documentation

## 2013-11-06 DIAGNOSIS — G43019 Migraine without aura, intractable, without status migrainosus: Secondary | ICD-10-CM | POA: Insufficient documentation

## 2013-11-06 DIAGNOSIS — R109 Unspecified abdominal pain: Secondary | ICD-10-CM | POA: Insufficient documentation

## 2013-11-06 DIAGNOSIS — Z8719 Personal history of other diseases of the digestive system: Secondary | ICD-10-CM | POA: Insufficient documentation

## 2013-11-06 DIAGNOSIS — Z3202 Encounter for pregnancy test, result negative: Secondary | ICD-10-CM | POA: Insufficient documentation

## 2013-11-06 DIAGNOSIS — F411 Generalized anxiety disorder: Secondary | ICD-10-CM | POA: Insufficient documentation

## 2013-11-06 DIAGNOSIS — N201 Calculus of ureter: Secondary | ICD-10-CM | POA: Insufficient documentation

## 2013-11-06 DIAGNOSIS — Z79899 Other long term (current) drug therapy: Secondary | ICD-10-CM | POA: Insufficient documentation

## 2013-11-06 LAB — URINALYSIS, ROUTINE W REFLEX MICROSCOPIC
Bilirubin Urine: NEGATIVE
GLUCOSE, UA: NEGATIVE mg/dL
KETONES UR: NEGATIVE mg/dL
Nitrite: NEGATIVE
PH: 5.5 (ref 5.0–8.0)
Protein, ur: NEGATIVE mg/dL
Specific Gravity, Urine: 1.023 (ref 1.005–1.030)
Urobilinogen, UA: 1 mg/dL (ref 0.0–1.0)

## 2013-11-06 LAB — I-STAT CHEM 8, ED
BUN: 15 mg/dL (ref 6–23)
CALCIUM ION: 1.25 mmol/L — AB (ref 1.12–1.23)
CHLORIDE: 105 meq/L (ref 96–112)
Creatinine, Ser: 1 mg/dL (ref 0.50–1.10)
GLUCOSE: 65 mg/dL — AB (ref 70–99)
HCT: 43 % (ref 36.0–46.0)
Hemoglobin: 14.6 g/dL (ref 12.0–15.0)
Potassium: 3.8 mEq/L (ref 3.7–5.3)
Sodium: 143 mEq/L (ref 137–147)
TCO2: 25 mmol/L (ref 0–100)

## 2013-11-06 LAB — PREGNANCY, URINE: PREG TEST UR: NEGATIVE

## 2013-11-06 LAB — URINE MICROSCOPIC-ADD ON

## 2013-11-06 MED ORDER — MORPHINE SULFATE 4 MG/ML IJ SOLN
4.0000 mg | Freq: Once | INTRAMUSCULAR | Status: AC
  Administered 2013-11-06: 4 mg via INTRAMUSCULAR
  Filled 2013-11-06: qty 1

## 2013-11-06 MED ORDER — OXYCODONE-ACETAMINOPHEN 5-325 MG PO TABS: 1.0000 | ORAL_TABLET | Freq: Four times a day (QID) | ORAL | Status: DC | PRN

## 2013-11-06 MED ORDER — ONDANSETRON HCL 4 MG PO TABS: 4.0000 mg | ORAL_TABLET | Freq: Three times a day (TID) | ORAL | Status: DC | PRN

## 2013-11-06 MED ORDER — OXYCODONE-ACETAMINOPHEN 5-325 MG PO TABS
1.0000 | ORAL_TABLET | Freq: Once | ORAL | Status: AC
  Administered 2013-11-06: 1 via ORAL
  Filled 2013-11-06: qty 1

## 2013-11-06 MED ORDER — PHENAZOPYRIDINE HCL 200 MG PO TABS
200.0000 mg | ORAL_TABLET | Freq: Once | ORAL | Status: AC
  Administered 2013-11-06: 200 mg via ORAL
  Filled 2013-11-06: qty 1

## 2013-11-06 MED ORDER — TAMSULOSIN HCL 0.4 MG PO CAPS: 0.4000 mg | ORAL_CAPSULE | Freq: Every day | ORAL | Status: DC

## 2013-11-06 NOTE — Discharge Instructions (Signed)
Please follow up with your primary care physician in 1-2 days. If you do not have one please [facility] the [facility] listed above. Please follow up with Dr. Isabel CapriceGrapey as needed. Please take pain medication and/or muscle relaxants as prescribed and as needed for pain. Please do not drive on narcotic pain medication or on muscle relaxants. Please read all discharge instructions and return precautions.    Kidney Stones Kidney stones (urolithiasis) are deposits that form inside your kidneys. The intense pain is caused by the stone moving through the urinary tract. When the stone moves, the ureter goes into spasm around the stone. The stone is usually passed in the urine.  CAUSES   A disorder that makes certain neck glands produce too much parathyroid hormone (primary hyperparathyroidism).  A buildup of uric acid crystals, similar to gout in your joints.  Narrowing (stricture) of the ureter.  A kidney obstruction present at birth (congenital obstruction).  Previous surgery on the kidney or ureters.  Numerous kidney infections. SYMPTOMS   Feeling sick to your stomach (nauseous).  Throwing up (vomiting).  Blood in the urine (hematuria).  Pain that usually spreads (radiates) to the groin.  Frequency or urgency of urination. DIAGNOSIS   Taking a history and physical exam.  Blood or urine tests.  CT scan.  Occasionally, an examination of the inside of the urinary bladder (cystoscopy) is performed. TREATMENT   Observation.  Increasing your fluid intake.  Extracorporeal shock wave lithotripsy--This is a noninvasive procedure that uses shock waves to break up kidney stones.  Surgery may be needed if you have severe pain or persistent obstruction. There are various surgical procedures. Most of the procedures are performed with the use of small instruments. Only small incisions are needed to accommodate these instruments, so recovery time is minimized. The size,  location, and chemical composition are all important variables that will determine the proper choice of action for you. Talk to your health care provider to better understand your situation so that you will minimize the risk of injury to yourself and your kidney.  HOME CARE INSTRUCTIONS   Drink enough water and fluids to keep your urine clear or pale yellow. This will help you to pass the stone or stone fragments.  Strain all urine through the provided strainer. Keep all particulate matter and stones for your health care provider to see. The stone causing the pain may be as small as a grain of salt. It is very important to use the strainer each and every time you pass your urine. The collection of your stone will allow your health care provider to analyze it and verify that a stone has actually passed. The stone analysis will often identify what you can do to reduce the incidence of recurrences.  Only take over-the-counter or prescription medicines for pain, discomfort, or fever as directed by your health care provider.  Make a follow-up appointment with your health care provider as directed.  Get follow-up X-rays if required. The absence of pain does not always mean that the stone has passed. It may have only stopped moving. If the urine remains completely obstructed, it can cause loss of kidney function or even complete destruction of the kidney. It is your responsibility to make sure X-rays and follow-ups are completed. Ultrasounds of the kidney can show blockages and the status of the kidney. Ultrasounds are not associated with any radiation and can be performed easily in a matter of minutes. SEEK MEDICAL CARE IF:  You experience pain that is progressive and unresponsive to any pain medicine you have been prescribed. SEEK IMMEDIATE MEDICAL CARE IF:   Pain cannot be controlled with the prescribed medicine.  You have a fever or shaking chills.  The severity or intensity of pain increases over  18 hours and is not relieved by pain medicine.  You develop a new onset of abdominal pain.  You feel faint or pass out.  You are unable to urinate. MAKE SURE YOU:   Understand these instructions.  Will watch your condition.  Will get help right away if you are not doing well or get worse. Document Released: 04/28/2005 Document Revised: 12/29/2012 Document Reviewed: 09/29/2012 [facility] Patient Information 2015 [place], [place]. This information is not intended to replace advice given to you by your health care provider. Make sure you discuss any questions you have with your health care provider.

## 2013-11-06 NOTE — ED Notes (Signed)
PA Jen at bedside.  

## 2013-11-06 NOTE — ED Notes (Signed)
She c/o urinary frequency and some right flank "aching" since yesterday.  She is in no distress.

## 2013-11-06 NOTE — ED Provider Notes (Signed)
Medical screening examination/treatment/procedure(s) were performed by non-physician practitioner and as supervising physician I was immediately available for consultation/collaboration.    Jon Knapp, MD 11/06/13 1606 

## 2013-11-06 NOTE — ED Notes (Signed)
Strainer provided

## 2013-11-06 NOTE — ED Provider Notes (Signed)
CSN: [id number]     Arrival date & time 11/06/13  0844 History   First MD Initiated Contact with Patient 11/06/13 0912     Chief Complaint  Patient presents with  . Urinary Frequency     (Consider location/radiation/quality/duration/timing/severity/associated sxs/prior Treatment) HPI Comments: Patient is a 53 yo F PMHx presenting to the emergency department for one day of right flank pain with radiation into the suprapubic region. She describes as an achy contraction-like feeling with associated dysuria, urinary frequency, urinary urgency, and decreased urine output. Alleviating factors: None. Aggravating factors: None. Medications tried prior to arrival: none. Abdominal surgical history of c-section, and hernia repair. LMP 3 years ago s/p ablation.      Past Medical History  Diagnosis Date  . ANXIETY 11/16/2009  . CHEST WALL PAIN, ACUTE 04/16/2010  . FATIGUE 03/28/2009  . KNEE PAIN 02/04/2010  . MENTAL CONFUSION 11/16/2009  . MIGRAINE, COMMON W/INTRACTABLE MIGRAINE 03/28/2009  . Nausea alone 03/28/2009  . Hernia   . Phlebitis, complicating pregnancy or puerperium   . DVT (deep venous thrombosis)   . Vertigo   . Fibromyalgia   . Constipation    Past Surgical History  Procedure Laterality Date  . Hernia repair    . Cesarean section    . Endometrial ablation     Family History  Problem Relation Age of Onset  . Diabetes Other   . Hypertension Other   . Kidney disease Other   . Diabetes Mother   . Heart disease Mother   . Kidney disease Mother   . Hypertension Mother   . Stroke Mother    History  Substance Use Topics  . Smoking status: Never Smoker   . Smokeless tobacco: Not on file  . Alcohol Use: No   OB History   Grav Para Term Preterm Abortions TAB SAB Ect Mult Living                 Review of Systems  Constitutional: Negative for fever and chills.  Gastrointestinal: Positive for abdominal pain. Negative for nausea, vomiting, diarrhea and constipation.   Genitourinary: Positive for dysuria, urgency, frequency, flank pain and decreased urine volume. Negative for hematuria, vaginal bleeding, vaginal discharge and pelvic pain.  All other systems reviewed and are negative.     Allergies  Blueberry; Grapeseed extract; and Topiramate  Home Medications   Prior to Admission medications   Medication Sig Start Date End Date Taking? Authorizing Provider  amitriptyline (ELAVIL) 50 MG tablet Take 50 mg by mouth at bedtime.   Yes Historical Provider, MD  gabapentin (NEURONTIN) 300 MG capsule Take 300 mg by mouth 3 (three) times daily.   Yes Historical Provider, MD  ondansetron (ZOFRAN) 4 MG tablet Take 1 tablet (4 mg total) by mouth every 8 (eight) hours as needed for nausea or vomiting. 11/06/13   Lise Auer Ebba Goll, PA-C  oxyCODONE-acetaminophen (PERCOCET) 5-325 MG per tablet Take 1-2 tablets by mouth every 6 (six) hours as needed for severe pain. 11/06/13   Archer Vise L Karnell Vanderloop, PA-C  tamsulosin (FLOMAX) 0.4 MG CAPS capsule Take 1 capsule (0.4 mg total) by mouth daily. 11/06/13   Kate Sweetman L Vern Prestia, PA-C   BP 118/75  Pulse 95  Temp(Src) 97.7 F (36.5 C) (Oral)  Resp 16  SpO2 100% Physical Exam  Nursing note and vitals reviewed. Constitutional: She is oriented to person, place, and time. She appears well-developed and well-nourished. No distress.  Patient writhing on stretcher  HENT:  Head: Normocephalic and atraumatic.  Right  Ear: External ear normal.  Left Ear: External ear normal.  Nose: Nose normal.  Mouth/Throat: Oropharynx is clear and moist.  Eyes: Conjunctivae are normal.  Neck: Normal range of motion. Neck supple.  Cardiovascular: Normal rate, regular rhythm and normal heart sounds.   Pulmonary/Chest: Effort normal and breath sounds normal. No respiratory distress.  Abdominal: Soft. Normal appearance and bowel sounds are normal. She exhibits no distension. There is tenderness in the suprapubic area. There is CVA  tenderness (right sided). There is no rigidity, no rebound and no guarding.  Musculoskeletal: Normal range of motion. She exhibits no edema.  Neurological: She is alert and oriented to person, place, and time.  Skin: Skin is warm and dry. She is not diaphoretic.  Psychiatric: She has a normal mood and affect.    ED Course  Procedures (including critical care time) Medications  phenazopyridine (PYRIDIUM) tablet 200 mg (200 mg Oral Given 11/06/13 0938)  morphine 4 MG/ML injection 4 mg (4 mg Intramuscular Given 11/06/13 1035)  oxyCODONE-acetaminophen (PERCOCET/ROXICET) 5-325 MG per tablet 1 tablet (1 tablet Oral Given 11/06/13 1313)    Labs Review Labs Reviewed  URINALYSIS, ROUTINE W REFLEX MICROSCOPIC - Abnormal; Notable for the following:    Hgb urine dipstick TRACE (*)    Leukocytes, UA SMALL (*)    All other components within normal limits  I-STAT CHEM 8, ED - Abnormal; Notable for the following:    Glucose, Bld 65 (*)    Calcium, Ion 1.25 (*)    All other components within normal limits  URINE CULTURE  PREGNANCY, URINE  URINE MICROSCOPIC-ADD ON    Imaging Review Ct Abdomen Pelvis Wo Contrast  11/06/2013   CLINICAL DATA:  Right flank pain  EXAM: CT ABDOMEN AND PELVIS WITHOUT CONTRAST  TECHNIQUE: Multidetector CT imaging of the abdomen and pelvis was performed following the standard protocol without IV contrast.  COMPARISON:  None.  FINDINGS: There is a tiny 1 mm calculus at the right ureterovesical junction. This is associated with mild right hydroureter and hydronephrosis. No definite ureteral calculus.  An IVC filter is present. The tip extends just above the renal vein inflow of the IVC. It is partially expanded. There is a linear calcified density within the region of the filter likely represent calcified prior chronic thrombus. Patency of the IVC cannot be assessed on this noncontrast study.  Liver, gallbladder, spleen, pancreas, adrenal glands are within normal limits.   Heterogeneous opacities at the lung bases are nonspecific.  Normal appendix.  Uterus and adnexa are within normal limits.  IMPRESSION: Tiny 1 mm calculus at the right ureterovesical junction associated with mild secondary findings of right ureteral obstruction.  IVC filter as described.   Electronically Signed   By: Maryclare BeanArt  Hoss M.D.   On: 11/06/2013 12:57   [place] Renal  11/06/2013   CLINICAL DATA:  Right-sided flank pain.  EXAM: RENAL/URINARY TRACT ULTRASOUND COMPLETE  COMPARISON:  None.  FINDINGS: Right Kidney:  Length: 10.3 cm. Moderate hydronephrosis. No renal cortical thinning.  Left Kidney:  Length: 10.0 cm. Echogenicity within normal limits. No mass or hydronephrosis visualized.  Bladder:  Within normal limits.  Bilateral ureteric jets identified.  IMPRESSION: Moderate right-sided hydronephrosis. No cause identified. Consider CT stone study if suspicion of obstructive stone. If suspicion of obstructive mass, consider hematuria protocol CT.   Electronically Signed   By: Jeronimo GreavesKyle  Talbot M.D.   On: 11/06/2013 11:26     EKG Interpretation None      MDM   Final diagnoses:  Ureteral calculus, right    Filed Vitals:   11/06/13 1340  BP: 118/75  Pulse: 95  Temp:   Resp: 16   Afebrile, NAD, non-toxic appearing, AAOx4. Abdomen soft, mildly tender in suprapubic region, no distention, no guarding rigidity or rebound. Bowel sounds normal. Mild right-sided CVA tenderness appreciated. Pt has been diagnosed with a Kidney Stone via CT. There is no evidence of significant hydronephrosis, serum creatine WNL, vitals sign stable and the pt does not have irratractable vomiting. Pt will be dc home with pain medications & has been advised to follow up with PCP. Patient is agreeable to plan and stable to discharge.     Jeannetta EllisJennifer L Willys Salvino, PA-C 11/06/13 1536

## 2013-11-06 NOTE — ED Notes (Signed)
US at bedside

## 2013-11-08 LAB — URINE CULTURE
COLONY COUNT: NO GROWTH
CULTURE: NO GROWTH

## 2014-03-28 ENCOUNTER — Encounter (HOSPITAL_COMMUNITY): Payer: Self-pay

## 2014-03-28 ENCOUNTER — Emergency Department (HOSPITAL_COMMUNITY): Payer: Medicaid Other

## 2014-03-28 ENCOUNTER — Emergency Department (HOSPITAL_COMMUNITY)
Admission: EM | Admit: 2014-03-28 | Discharge: 2014-03-28 | Disposition: A | Discharge status: Home / Self Care | Payer: Medicaid Other | Attending: Emergency Medicine | Admitting: Emergency Medicine

## 2014-03-28 DIAGNOSIS — Z79899 Other long term (current) drug therapy: Secondary | ICD-10-CM | POA: Diagnosis not present

## 2014-03-28 DIAGNOSIS — E860 Dehydration: Secondary | ICD-10-CM | POA: Insufficient documentation

## 2014-03-28 DIAGNOSIS — M62838 Other muscle spasm: Secondary | ICD-10-CM | POA: Diagnosis not present

## 2014-03-28 DIAGNOSIS — G43019 Migraine without aura, intractable, without status migrainosus: Secondary | ICD-10-CM | POA: Insufficient documentation

## 2014-03-28 DIAGNOSIS — R0602 Shortness of breath: Secondary | ICD-10-CM | POA: Diagnosis present

## 2014-03-28 DIAGNOSIS — Z8719 Personal history of other diseases of the digestive system: Secondary | ICD-10-CM | POA: Insufficient documentation

## 2014-03-28 DIAGNOSIS — Z86718 Personal history of other venous thrombosis and embolism: Secondary | ICD-10-CM | POA: Diagnosis not present

## 2014-03-28 DIAGNOSIS — R0789 Other chest pain: Secondary | ICD-10-CM | POA: Diagnosis not present

## 2014-03-28 DIAGNOSIS — R079 Chest pain, unspecified: Secondary | ICD-10-CM

## 2014-03-28 DIAGNOSIS — F411 Generalized anxiety disorder: Secondary | ICD-10-CM | POA: Insufficient documentation

## 2014-03-28 LAB — BASIC METABOLIC PANEL
ANION GAP: 11 (ref 5–15)
BUN: 9 mg/dL (ref 6–23)
CALCIUM: 9.7 mg/dL (ref 8.4–10.5)
CHLORIDE: 103 meq/L (ref 96–112)
CO2: 25 meq/L (ref 19–32)
Creatinine, Ser: 0.75 mg/dL (ref 0.50–1.10)
GFR calc Af Amer: >90 mL/min (ref >90)
GFR calc non Af Amer: >90 mL/min (ref >90)
Glucose, Bld: 83 mg/dL (ref 70–99)
Potassium: 3.9 mEq/L (ref 3.7–5.3)
Sodium: 139 mEq/L (ref 137–147)

## 2014-03-28 LAB — CBC
HCT: 39.2 % (ref 36.0–46.0)
Hemoglobin: 12.9 g/dL (ref 12.0–15.0)
MCH: 28.2 pg (ref 26.0–34.0)
MCHC: 32.9 g/dL (ref 30.0–36.0)
MCV: 85.8 fL (ref 78.0–100.0)
PLATELETS: 282 10*3/uL (ref 150–400)
RBC: 4.57 MIL/uL (ref 3.87–5.11)
RDW: 13.7 % (ref 11.5–15.5)
WBC: 5.4 10*3/uL (ref 4.0–10.5)

## 2014-03-28 LAB — I-STAT TROPONIN, ED: TROPONIN I, POC: 0 ng/mL (ref 0.00–0.08)

## 2014-03-28 MED ORDER — NAPROXEN 500 MG PO TABS: 500.0000 mg | ORAL_TABLET | Freq: Two times a day (BID) | ORAL | Status: DC | PRN

## 2014-03-28 MED ORDER — KETOROLAC TROMETHAMINE 30 MG/ML IJ SOLN
30.0000 mg | Freq: Once | INTRAMUSCULAR | Status: AC
  Administered 2014-03-28: 30 mg via INTRAMUSCULAR
  Filled 2014-03-28: qty 1

## 2014-03-28 MED ORDER — CYCLOBENZAPRINE HCL 10 MG PO TABS
5.0000 mg | ORAL_TABLET | Freq: Once | ORAL | Status: AC
  Administered 2014-03-28: 5 mg via ORAL
  Filled 2014-03-28: qty 1

## 2014-03-28 MED ORDER — CYCLOBENZAPRINE HCL 10 MG PO TABS: 5.0000 mg | ORAL_TABLET | Freq: Three times a day (TID) | ORAL | Status: DC | PRN

## 2014-03-28 NOTE — ED Notes (Signed)
PA at BS.  

## 2014-03-28 NOTE — ED Notes (Signed)
Pt from home with c/o left sided chest spasms that started today while watching tv. Pt states she has had pain like this in the past but has not been seen. Pt reports feeling malaise past few days, denies any cough, n/v/d or fevers at this time. nad noted. axox 4. Family at bedside.

## 2014-03-28 NOTE — Discharge Instructions (Signed)
Take naprosyn as directed for inflammation and pain and flexeril for muscle relaxation. Do not drive or operate machinery with muscle relaxation use. STAY WELL HYDRATED! Use heat to areas of soreness, no more than 20 minutes at a time every hour. Follow up with primary care physician for recheck of ongoing symptoms. Return to ER for emergent changing or worsening of symptoms.    Chest Wall Pain Chest wall pain is pain in or around the bones and muscles of your chest. It may take up to 6 weeks to get better. It may take longer if you must stay physically active in your work and activities.  CAUSES  Chest wall pain may happen on its own. However, it may be caused by:  A viral illness like the flu.  Injury.  Coughing.  Exercise.  Arthritis.  Fibromyalgia.  Shingles. HOME CARE INSTRUCTIONS   Avoid overtiring physical activity. Try not to strain or perform activities that cause pain. This includes any activities using your chest or your abdominal and side muscles, especially if heavy weights are used.  Put ice on the sore area.  Put ice in a plastic bag.  Place a towel between your skin and the bag.  Leave the ice on for 15-20 minutes per hour while awake for the first 2 days.  Only take over-the-counter or prescription medicines for pain, discomfort, or fever as directed by your caregiver. SEEK IMMEDIATE MEDICAL CARE IF:   Your pain increases, or you are very uncomfortable.  You have a fever.  Your chest pain becomes worse.  You have new, unexplained symptoms.  You have nausea or vomiting.  You feel sweaty or lightheaded.  You have a cough with phlegm (sputum), or you cough up blood. MAKE SURE YOU:   Understand these instructions.  Will watch your condition.  Will get help right away if you are not doing well or get worse. Document Released: 04/28/2005 Document Revised: 07/21/2011 Document Reviewed: 12/23/2010 [facility] Patient Information 2015 [place], [place].  This information is not intended to replace advice given to you by your health care provider. Make sure you discuss any questions you have with your health care provider.  Muscle Cramps and Spasms Muscle cramps and spasms are when muscles tighten by themselves. They usually get better within minutes. Muscle cramps are painful. They are usually stronger and last longer than muscle spasms. Muscle spasms may or may not be painful. They can last a few seconds or much longer. HOME CARE  Drink enough fluid to keep your pee (urine) clear or pale yellow.  Massage, stretch, and relax the muscle.  Use a warm towel, heating pad, or warm shower water on tight muscles.  Place ice on the muscle if it is tender or in pain.  Put ice in a plastic bag.  Place a towel between your skin and the bag.  Leave the ice on for 15-20 minutes, 03-04 times a day.  Only take medicine as told by your doctor. GET HELP RIGHT AWAY IF:  Your cramps or spasms get worse, happen more often, or do not get better with time. MAKE SURE YOU:  Understand these instructions.  Will watch your condition.  Will get help right away if you are not doing well or get worse. Document Released: 04/10/2008 Document Revised: 08/23/2012 Document Reviewed: 04/14/2012 [facility] Patient Information 2015 [place], [place]. This information is not intended to replace advice given to you by your health care provider. Make sure you discuss any questions you have with your  health care provider.  Dehydration, Adult Dehydration means your body does not have as much fluid as it needs. Your kidneys, brain, and heart will not work properly without the right amount of fluids and salt.  HOME CARE  Ask your doctor how to replace body fluid losses (rehydrate).  Drink enough fluids to keep your pee (urine) clear or pale yellow.  Drink small amounts of fluids often if you feel sick to your stomach (nauseous) or throw up (vomit).  Eat like you normally  do.  Avoid:  Foods or drinks high in sugar.  Bubbly (carbonated) drinks.  Juice.  Very hot or cold fluids.  Drinks with caffeine.  Fatty, greasy foods.  Alcohol.  Tobacco.  Eating too much.  Gelatin desserts.  Wash your hands to avoid spreading germs (bacteria, viruses).  Only take medicine as told by your doctor.  Keep all doctor visits as told. GET HELP RIGHT AWAY IF:   You cannot drink something without throwing up.  You get worse even with treatment.  Your vomit has blood in it or looks greenish.  Your poop (stool) has blood in it or looks black and tarry.  You have not peed in 6 to 8 hours.  You pee a small amount of very dark pee.  You have a fever.  You pass out (faint).  You have belly (abdominal) pain that gets worse or stays in one spot (localizes).  You have a rash, stiff neck, or bad headache.  You get easily annoyed, sleepy, or are hard to wake up.  You feel weak, dizzy, or very thirsty. MAKE SURE YOU:   Understand these instructions.  Will watch your condition.  Will get help right away if you are not doing well or get worse. Document Released: 02/22/2009 Document Revised: 07/21/2011 Document Reviewed: 12/16/2010 [facility] Patient Information 2015 [place], [place]. This information is not intended to replace advice given to you by your health care provider. Make sure you discuss any questions you have with your health care provider.

## 2014-03-28 NOTE — ED Notes (Signed)
Pt states she was not feeling well yesterday.  Pt reports L sided CP described as spasms.  Pt alert and oriented and in NAD.

## 2014-03-28 NOTE — ED Provider Notes (Signed)
CSN: [id number]     Arrival date & time 03/28/14  1446 History   First MD Initiated Contact with Patient 03/28/14 1833     Chief Complaint  Patient presents with  . Chest Pain  . Shortness of Breath     (Consider location/radiation/quality/duration/timing/severity/associated sxs/prior Treatment) HPI Comments: Bailey Patterson is a 53 y.o. female with a PMHx of anxiety, chest wall pain/spasm, fatigue, knee pain, migraines, hernia, post-op DVT without recurrence, vertigo, fibromyalgia, and chronic constipation, who presents to the ED with complaints of left-sided chest wall pain which gradually began yesterday she was watching TV. She describes this as 9/10 spasmodic pain which is constant and seems to be radiating into the left trapezius muscle as well. This occurred while at rest, and she states this feels very similar to a previous episode of spasm in the exact same place several years ago. Pain is worse with movement and palpation, relieved with relaxation, and unchanged with inspiration. She has not tried any medications for this. She denies any radiation to her back, arm, or neck. She reports that she felt like it was harder to breathe after the onset of this pain, but denies inability to take a deep breath or dyspnea on exertion. She has not been staying very well-hydrated and endorses having a dry mouth and a sensation of slight lightheadedness when standing, which has occurred for her in the past when she was dehydrated. She denies any fevers, chills, PND, claudication, palpitations, orthopnea, LE swelling, diaphoresis, vertigo, dizziness, headache, vision changes, abdominal pain, nausea, vomiting, URI symptoms, wheezing, cough, dysuria, hematuria, or focal neuro deficits. She reports that she's had one week of low back pain consistent with her fibromyalgia, but no new back pain.  Patient is a 53 y.o. female presenting with chest pain. The history is provided by the patient. No language  interpreter was used.  Chest Pain Pain location:  L chest Pain quality comment:  Spasming Radiates to: L trapezius. Pain radiates to the back: no   Pain severity:  Moderate (9/10) Onset quality:  Gradual Duration:  1 day Timing:  Constant Progression:  Unchanged Chronicity:  Recurrent Context: at rest   Relieved by: relaxation. Worsened by:  Movement Ineffective treatments:  None tried Associated symptoms: shortness of breath   Associated symptoms: no abdominal pain, no altered mental status, no anxiety, no back pain, no claudication, no cough, no diaphoresis, no dizziness, no fatigue, no fever, no headache, no heartburn, no lower extremity edema, no nausea, no near-syncope, no numbness, no orthopnea, no palpitations, no PND, no syncope, not vomiting and no weakness   Risk factors: no birth control, no hypertension and no surgery     Past Medical History  Diagnosis Date  . ANXIETY 11/16/2009  . CHEST WALL PAIN, ACUTE 04/16/2010  . FATIGUE 03/28/2009  . KNEE PAIN 02/04/2010  . MENTAL CONFUSION 11/16/2009  . MIGRAINE, COMMON W/INTRACTABLE MIGRAINE 03/28/2009  . Nausea alone 03/28/2009  . Hernia   . Phlebitis, complicating pregnancy or puerperium   . DVT (deep venous thrombosis)   . Vertigo   . Fibromyalgia   . Constipation    Past Surgical History  Procedure Laterality Date  . Hernia repair    . Cesarean section    . Endometrial ablation     Family History  Problem Relation Age of Onset  . Diabetes Other   . Hypertension Other   . Kidney disease Other   . Diabetes Mother   . Heart disease Mother   . Kidney  disease Mother   . Hypertension Mother   . Stroke Mother    History  Substance Use Topics  . Smoking status: Never Smoker   . Smokeless tobacco: Not on file  . Alcohol Use: No   OB History    No data available     Review of Systems  Constitutional: Negative for fever, chills, diaphoresis and fatigue.  HENT:       +dry mouth  Eyes: Negative for visual  disturbance.  Respiratory: Positive for shortness of breath. Negative for cough, chest tightness and wheezing.   Cardiovascular: Positive for chest pain. Negative for palpitations, orthopnea, claudication, leg swelling, syncope, PND and near-syncope.  Gastrointestinal: Negative for heartburn, nausea, vomiting, abdominal pain, diarrhea and constipation.  Genitourinary: Negative for dysuria and hematuria.  Musculoskeletal: Positive for myalgias (L trapezius, L pectoralis). Negative for back pain, arthralgias, gait problem, neck pain and neck stiffness.  Skin: Negative for color change.  Neurological: Positive for light-headedness. Negative for dizziness, syncope, weakness, numbness and headaches.  Psychiatric/Behavioral: Negative for confusion.   10 Systems reviewed and are negative for acute change except as noted in the HPI.    Allergies  Blueberry; Grapeseed extract; and Topiramate  Home Medications   Prior to Admission medications   Medication Sig Start Date End Date Taking? Authorizing Provider  amitriptyline (ELAVIL) 50 MG tablet Take 50 mg by mouth at bedtime.    Historical Provider, MD  gabapentin (NEURONTIN) 300 MG capsule Take 300 mg by mouth 3 (three) times daily.    Historical Provider, MD  ondansetron (ZOFRAN) 4 MG tablet Take 1 tablet (4 mg total) by mouth every 8 (eight) hours as needed for nausea or vomiting. 11/06/13   Lise AuerJennifer L Piepenbrink, PA-C  oxyCODONE-acetaminophen (PERCOCET) 5-325 MG per tablet Take 1-2 tablets by mouth every 6 (six) hours as needed for severe pain. 11/06/13   Jennifer L Piepenbrink, PA-C  tamsulosin (FLOMAX) 0.4 MG CAPS capsule Take 1 capsule (0.4 mg total) by mouth daily. 11/06/13   Jennifer L Piepenbrink, PA-C   BP 134/86 mmHg  Pulse 87  Temp(Src) 97.2 F (36.2 C) (Oral)  Resp 15  SpO2 100% Physical Exam  Constitutional: She is oriented to person, place, and time. Vital signs are normal. She appears well-developed and well-nourished.  Non-toxic  appearance. No distress.  Afebrile, nontoxic, NAD, VSS  HENT:  Head: Normocephalic and atraumatic.  Mouth/Throat: Oropharynx is clear and moist and mucous membranes are normal.  Eyes: Conjunctivae and EOM are normal. Right eye exhibits no discharge. Left eye exhibits no discharge.  Neck: Normal range of motion. Neck supple. Muscular tenderness present. No spinous process tenderness present. No rigidity. No edema, no erythema and normal range of motion present.    FROM intact without spinous process TTP, no bony stepoffs or deformities, L trapezius and paraspinous muscle TTP and spasms. No rigidity or meningeal signs. No bruising or swelling.  Cardiovascular: Normal rate, regular rhythm, normal heart sounds and intact distal pulses.  Exam reveals no gallop and no friction rub.   No murmur heard. RRR, nl s1/s2, no m/r/g, distal pulses equal  Pulmonary/Chest: Effort normal and breath sounds normal. No respiratory distress. She has no decreased breath sounds. She has no wheezes. She has no rhonchi. She has no rales. She exhibits tenderness. She exhibits no crepitus and no retraction.    CTAB in all lung fields L pectoralis muscle TTP with spasm, no retraction or subQ air  Abdominal: Soft. Normal appearance and bowel sounds are normal. She exhibits  no distension. There is no tenderness. There is no rigidity, no rebound, no guarding, no CVA tenderness, no tenderness at McBurney's point and negative Murphy's sign.  Musculoskeletal: Normal range of motion.  MAEx4 Strength 5/5 in all extremities Sensation grossly intact in all extremities Gait nonataxic  Neurological: She is alert and oriented to person, place, and time. She has normal strength. No sensory deficit. Gait normal.  Skin: Skin is warm, dry and intact. No rash noted.  Psychiatric: She has a normal mood and affect.  Nursing note and vitals reviewed.   ED Course  Procedures (including critical care time) Labs Review Labs Reviewed    CBC  BASIC METABOLIC PANEL  Rosezena Sensor, ED    Imaging Review Dg Chest 2 View  03/28/2014   CLINICAL DATA:  53 year old female left chest pain radiating to the back with shortness of Breath. Initial encounter.  EXAM: CHEST  2 VIEW  COMPARISON:  CT Abdomen and Pelvis 11/06/2013. Chest radiographs 04/20/2006.  FINDINGS: IVC filter unchanged since 2007. Lower lung volumes. Streaky and patchy bibasilar pulmonary opacity, resembles that on the CT in June which was associated with distal peribronchial thickening on that exam. Normal cardiac size and mediastinal contours. Visualized tracheal air column is within normal limits. No pneumothorax or pulmonary edema. No pleural effusion or other confluent pulmonary opacity. No acute osseous abnormality identified.  IMPRESSION: Low lung volumes with streaky bibasilar opacity similar to that seen on the CT Abdomen and Pelvis in June. These changes are new since 2007.  Favor sequelae of chronic/recurrent lung base infection.   Electronically Signed   By: Augusto Gamble M.D.   On: 03/28/2014 16:04     EKG Interpretation None    EKG: sinus tachy 103  MDM   Final diagnoses:  Left-sided chest wall pain  Muscle spasm  Dehydration    53y/o female with muscle spasm of L pectoralis and trapezius, reproducible pain of L chest wall. Trop neg, BMP and CBC WNL. CXR without acute findings. EKG sinus tachy which resolved, HR stable in the 80s afterwards. Applied heat and given toradol and flexeril with full relief of symptoms. Doubt cardiac etiology at this time. Doubt DVT/PE. Her shortness of breath was more related to needing to drink water, slightly dehydrated, as well as having some anxiety about her pain. No ongoing SOB and oxygenating well on RA. Will rx naprosyn and advised use of heat. Will give rx for flexeril. Will have her see her PCP in 1wk for recheck of ongoing symptoms. I explained the diagnosis and have given explicit precautions to return to the ER  including for any other new or worsening symptoms. The patient understands and accepts the medical plan as it's been dictated and I have answered their questions. Discharge instructions concerning home care and prescriptions have been given. The patient is STABLE and is discharged to home in good condition.  BP 115/87 mmHg  Pulse 87  Temp(Src) 97.2 F (36.2 C) (Oral)  Resp 15  SpO2 100%  Meds ordered this encounter  Medications  . ketorolac (TORADOL) 30 MG/ML injection 30 mg    Sig:   . cyclobenzaprine (FLEXERIL) tablet 5 mg    Sig:   . naproxen (NAPROSYN) 500 MG tablet    Sig: Take 1 tablet (500 mg total) by mouth 2 (two) times daily as needed for mild pain, moderate pain or headache (TAKE WITH MEALS.).    Dispense:  20 tablet    Refill:  0    Order Specific  Question:  Supervising Provider    Answer:  Eber HongMILLER, BRIAN D [[id number]]  . cyclobenzaprine (FLEXERIL) 10 MG tablet    Sig: Take 0.5 tablets (5 mg total) by mouth 3 (three) times daily as needed for muscle spasms.    Dispense:  15 tablet    Refill:  0    Order Specific Question:  Supervising Provider    Answer:  Vida RollerMILLER, BRIAN D [address][id number]]       Amamda Curbow Strupp Camprubi-Soms, PA-C 03/28/14 2018  Arby BarretteMarcy Pfeiffer, MD 03/29/14 [phone]

## 2014-05-13 ENCOUNTER — Emergency Department (HOSPITAL_COMMUNITY)
Admission: EM | Admit: 2014-05-13 | Discharge: 2014-05-13 | Disposition: A | Discharge status: Home / Self Care | Payer: Medicaid Other | Attending: Emergency Medicine | Admitting: Emergency Medicine

## 2014-05-13 ENCOUNTER — Encounter (HOSPITAL_COMMUNITY): Payer: Self-pay | Admitting: Emergency Medicine

## 2014-05-13 DIAGNOSIS — M62838 Other muscle spasm: Secondary | ICD-10-CM | POA: Insufficient documentation

## 2014-05-13 DIAGNOSIS — G43909 Migraine, unspecified, not intractable, without status migrainosus: Secondary | ICD-10-CM | POA: Insufficient documentation

## 2014-05-13 DIAGNOSIS — M549 Dorsalgia, unspecified: Secondary | ICD-10-CM | POA: Diagnosis present

## 2014-05-13 DIAGNOSIS — M797 Fibromyalgia: Secondary | ICD-10-CM | POA: Diagnosis not present

## 2014-05-13 DIAGNOSIS — F419 Anxiety disorder, unspecified: Secondary | ICD-10-CM | POA: Diagnosis not present

## 2014-05-13 DIAGNOSIS — Z86718 Personal history of other venous thrombosis and embolism: Secondary | ICD-10-CM | POA: Diagnosis not present

## 2014-05-13 DIAGNOSIS — Z79899 Other long term (current) drug therapy: Secondary | ICD-10-CM | POA: Insufficient documentation

## 2014-05-13 DIAGNOSIS — M545 Low back pain, unspecified: Secondary | ICD-10-CM

## 2014-05-13 DIAGNOSIS — Z8719 Personal history of other diseases of the digestive system: Secondary | ICD-10-CM | POA: Diagnosis not present

## 2014-05-13 LAB — URINALYSIS, ROUTINE W REFLEX MICROSCOPIC
Bilirubin Urine: NEGATIVE
Glucose, UA: NEGATIVE mg/dL
HGB URINE DIPSTICK: NEGATIVE
Ketones, ur: NEGATIVE mg/dL
Leukocytes, UA: NEGATIVE
Nitrite: NEGATIVE
Protein, ur: NEGATIVE mg/dL
Specific Gravity, Urine: 1.018 (ref 1.005–1.030)
UROBILINOGEN UA: 0.2 mg/dL (ref 0.0–1.0)
pH: 6 (ref 5.0–8.0)

## 2014-05-13 MED ORDER — OXYCODONE-ACETAMINOPHEN 5-325 MG PO TABS
1.0000 | ORAL_TABLET | Freq: Once | ORAL | Status: AC
  Administered 2014-05-13: 1 via ORAL
  Filled 2014-05-13: qty 1

## 2014-05-13 MED ORDER — OXYCODONE-ACETAMINOPHEN 5-325 MG PO TABS: 1.0000 | ORAL_TABLET | Freq: Four times a day (QID) | ORAL | Status: DC | PRN

## 2014-05-13 NOTE — ED Notes (Signed)
Pt c/o muscle spasms, back pain and flare up of fibromyalgia x 1 week. Pt tried to see her PMD but he was not in office all week.

## 2014-05-13 NOTE — Discharge Instructions (Signed)
Back Pain, Adult °Back pain is very common. The pain often gets better over time. The cause of back pain is usually not dangerous. Most people can learn to manage their back pain on their own.  °HOME CARE  °· Stay active. Start with short walks on flat ground if you can. Try to walk farther each day. °· Do not sit, drive, or stand in one place for more than 30 minutes. Do not stay in bed. °· Do not avoid exercise or work. Activity can help your back heal faster. °· Be careful when you bend or lift an object. Bend at your knees, keep the object close to you, and do not twist. °· Sleep on a firm mattress. Lie on your side, and bend your knees. If you lie on your back, put a pillow under your knees. °· Only take medicines as told by your doctor. °· Put ice on the injured area. °¨ Put ice in a plastic bag. °¨ Place a towel between your skin and the bag. °¨ Leave the ice on for 15-20 minutes, 03-04 times a day for the first 2 to 3 days. After that, you can switch between ice and heat packs. °· Ask your doctor about back exercises or massage. °· Avoid feeling anxious or stressed. Find good ways to deal with stress, such as exercise. °GET HELP RIGHT AWAY IF:  °· Your pain does not go away with rest or medicine. °· Your pain does not go away in 1 week. °· You have new problems. °· You do not feel well. °· The pain spreads into your legs. °· You cannot control when you poop (bowel movement) or pee (urinate). °· Your arms or legs feel weak or lose feeling (numbness). °· You feel sick to your stomach (nauseous) or throw up (vomit). °· You have belly (abdominal) pain. °· You feel like you may pass out (faint). °MAKE SURE YOU:  °· Understand these instructions. °· Will watch your condition. °· Will get help right away if you are not doing well or get worse. °Document Released: 10/15/2007 Document Revised: 07/21/2011 Document Reviewed: 08/30/2013 °Exit[employer]® Patient Information ©2015 [employer]. This information is not intended  to replace advice given to you by your health care provider. Make sure you discuss any questions you have with your health care provider. ° °

## 2014-05-13 NOTE — ED Provider Notes (Signed)
CSN: [id number]     Arrival date & time 05/13/14  1548 History  This chart was scribed for Teressa Lower, NP, working with Purvis Sheffield, MD by Chestine Spore, ED Scribe. The patient was seen in room TR11C/TR11C at 6:32 PM.    Chief Complaint  Patient presents with  . Spasms  . Back Pain  . Fibromyalgia     The history is provided by the patient. No language interpreter was used.    HPI Comments: Bailey Patterson is a 54 y.o. female who presents to the Emergency Department complaining of spasms onset 1 week.  She reports that she has not felt good for the past week. She notes that she has been taking her Lyrica 75 mg BID for her fibromyalgia and that she has not skipped a dose. She reports that with her fibromyalgia she has pain in her arms and legs. She states that she is having associated symptoms of frequency and back pain. She notes that she has been taking her usual medications for her current symptoms. She denies fever, and any other symptoms. Denies being allergic to anything medications. She notes that she has not been able to see her PCP or Pain management because of the holidays. Her pain management has her on Amitriptyline. She is unsure of what her pain contract is with them.   Past Medical History  Diagnosis Date  . ANXIETY 11/16/2009  . CHEST WALL PAIN, ACUTE 04/16/2010  . FATIGUE 03/28/2009  . KNEE PAIN 02/04/2010  . MENTAL CONFUSION 11/16/2009  . MIGRAINE, COMMON W/INTRACTABLE MIGRAINE 03/28/2009  . Nausea alone 03/28/2009  . Hernia   . Phlebitis, complicating pregnancy or puerperium   . DVT (deep venous thrombosis)   . Vertigo   . Fibromyalgia   . Constipation    Past Surgical History  Procedure Laterality Date  . Hernia repair    . Cesarean section    . Endometrial ablation     Family History  Problem Relation Age of Onset  . Diabetes Other   . Hypertension Other   . Kidney disease Other   . Diabetes Mother   . Heart disease Mother   . Kidney disease Mother    . Hypertension Mother   . Stroke Mother    History  Substance Use Topics  . Smoking status: Never Smoker   . Smokeless tobacco: Not on file  . Alcohol Use: No   OB History    No data available     Review of Systems  Constitutional: Negative for fever.  Genitourinary: Positive for frequency.  Musculoskeletal: Positive for myalgias and back pain.  All other systems reviewed and are negative.     Allergies  Blueberry; Grapeseed extract; and Topiramate  Home Medications   Prior to Admission medications   Medication Sig Start Date End Date Taking? Authorizing Provider  amitriptyline (ELAVIL) 50 MG tablet Take 50 mg by mouth at bedtime.    Historical Provider, MD  cyclobenzaprine (FLEXERIL) 10 MG tablet Take 0.5 tablets (5 mg total) by mouth 3 (three) times daily as needed for muscle spasms. 03/28/14   Mercedes Strupp Camprubi-Soms, PA-C  gabapentin (NEURONTIN) 300 MG capsule Take 300 mg by mouth 3 (three) times daily.    Historical Provider, MD  naproxen (NAPROSYN) 500 MG tablet Take 1 tablet (500 mg total) by mouth 2 (two) times daily as needed for mild pain, moderate pain or headache (TAKE WITH MEALS.). 03/28/14   Mercedes Strupp Camprubi-Soms, PA-C  ondansetron (ZOFRAN) 4 MG tablet Take  1 tablet (4 mg total) by mouth every 8 (eight) hours as needed for nausea or vomiting. 11/06/13   Lise Auer Piepenbrink, PA-C  oxyCODONE-acetaminophen (PERCOCET) 5-325 MG per tablet Take 1-2 tablets by mouth every 6 (six) hours as needed for severe pain. 11/06/13   Jennifer L Piepenbrink, PA-C  tamsulosin (FLOMAX) 0.4 MG CAPS capsule Take 1 capsule (0.4 mg total) by mouth daily. 11/06/13   Jennifer L Piepenbrink, PA-C   BP 138/65 mmHg  Pulse 101  Temp(Src) 98.2 F (36.8 C) (Oral)  Resp 16  Ht  (1.6 m)  Wt 140 lb (63.504 kg)  BMI 24.81 kg/m2  SpO2 100%  Physical Exam  Constitutional: She is oriented to person, place, and time. She appears well-developed and well-nourished. No  distress.  HENT:  Head: Normocephalic and atraumatic.  Eyes: EOM are normal.  Neck: Neck supple. No tracheal deviation present.  Cardiovascular: Normal rate.   Pulmonary/Chest: Effort normal. No respiratory distress.  Musculoskeletal: Normal range of motion. She exhibits no tenderness.  Lower back tenderness. Moving all extremities without any problem.  Neurological: She is alert and oriented to person, place, and time.  Skin: Skin is warm and dry.  Psychiatric: She has a normal mood and affect. Her behavior is normal.  Nursing note and vitals reviewed.   ED Course  Procedures (including critical care time) DIAGNOSTIC STUDIES: Oxygen Saturation is 100% on room air, normal by my interpretation.    COORDINATION OF CARE: 6:37 PM-Discussed treatment plan which includes UA with pt at bedside and pt agreed to plan.   Labs Review Labs Reviewed - No data to display  Imaging Review No results found.   EKG Interpretation None      MDM   Final diagnoses:  Bilateral low back pain without sciatica    Pt is neurologically intact. Likely flare of chronic pain. Will treat with oxycodone and refer back with pcp and pain management  I personally performed the services described in this documentation, which was scribed in my presence. The recorded information has been reviewed and is accurate.    Teressa Lower, NP 05/13/14 1934  Purvis Sheffield, MD 05/14/14 1139

## 2014-08-31 IMAGING — US US RENAL
1 series · 14 of 25 positions shown · non-contrast
Comparison: None.

CLINICAL DATA: Right-sided flank pain.

EXAM:
RENAL/URINARY TRACT ULTRASOUND COMPLETE

[Series 1: us renal · 0.21mm/px · 14 of 96 slices shown]
[im 1/96]
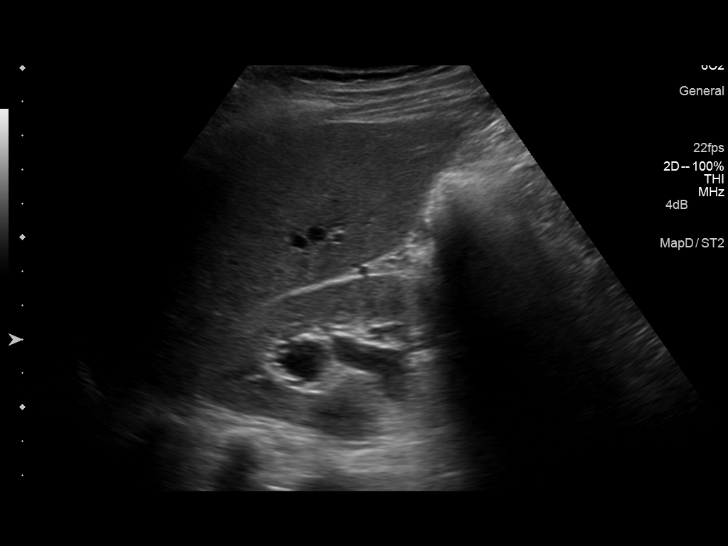
[im 8/96]
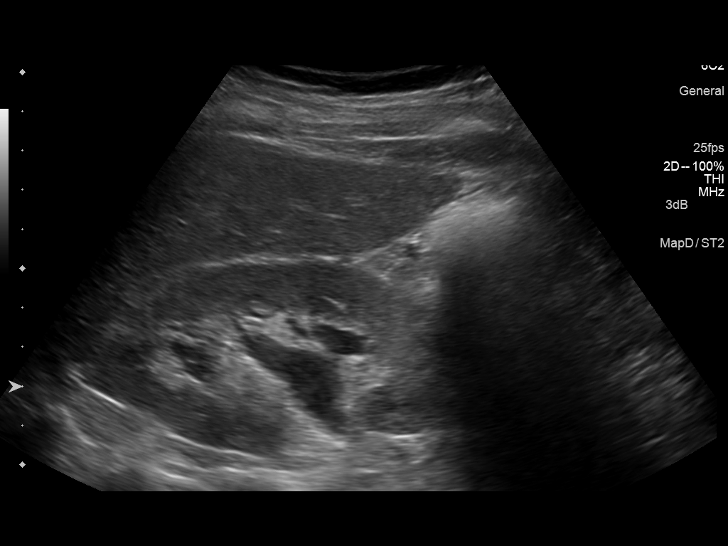
[im 16/96]
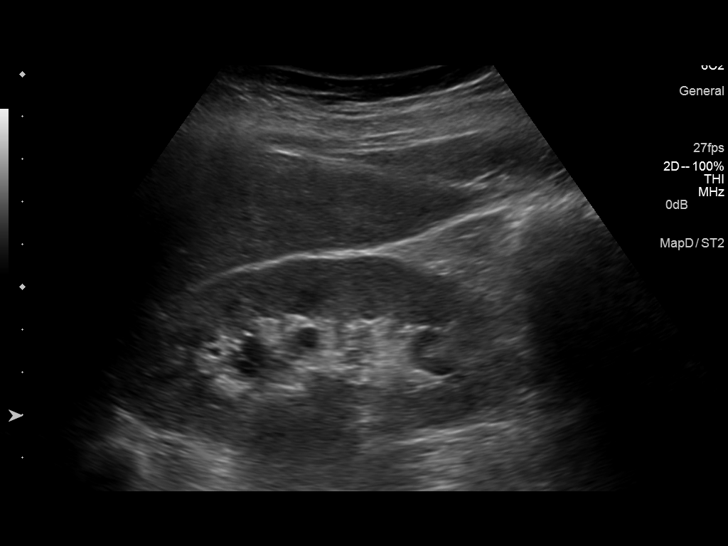
[im 24/96]
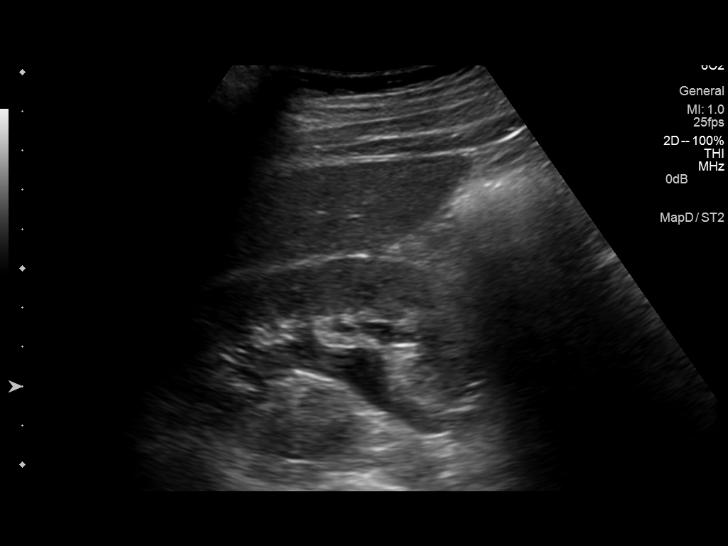
[im 32/96]
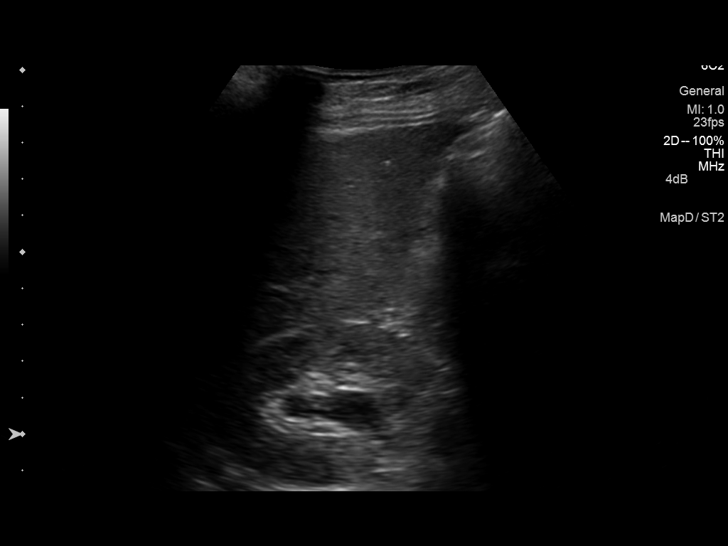
[im 36/96]
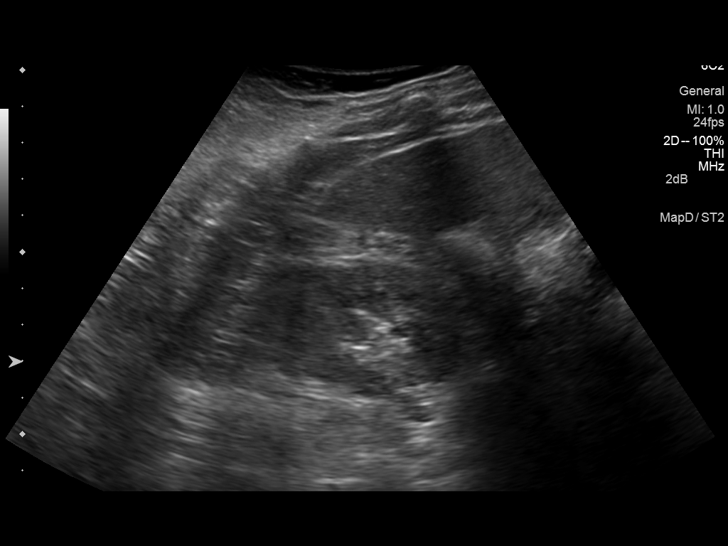
[im 44/96]
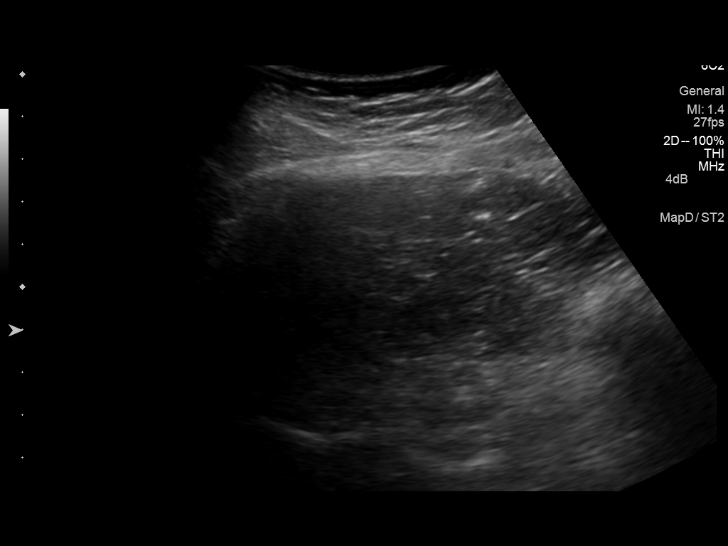
[im 52/96]
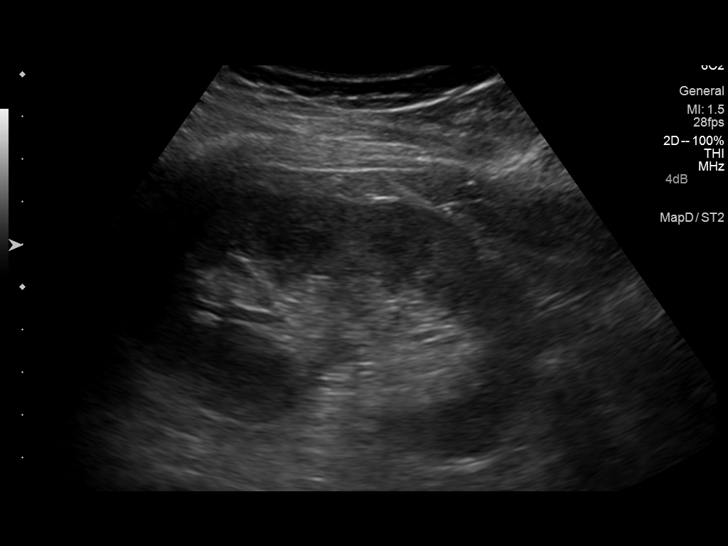
[im 60/96]
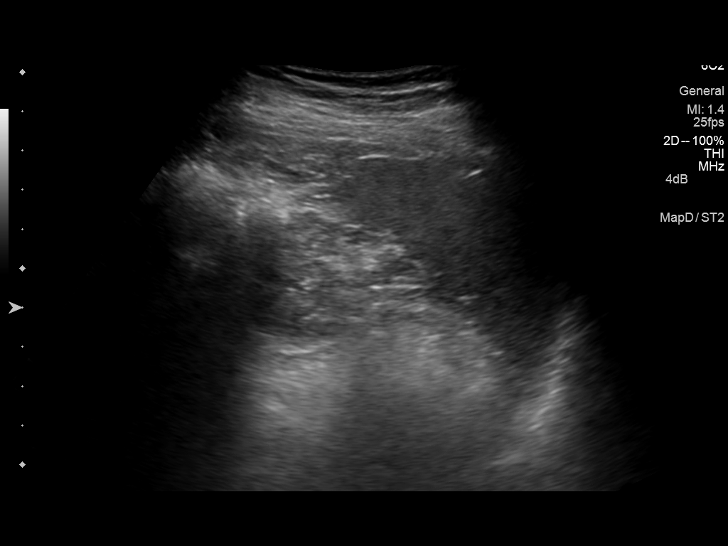
[im 64/96]
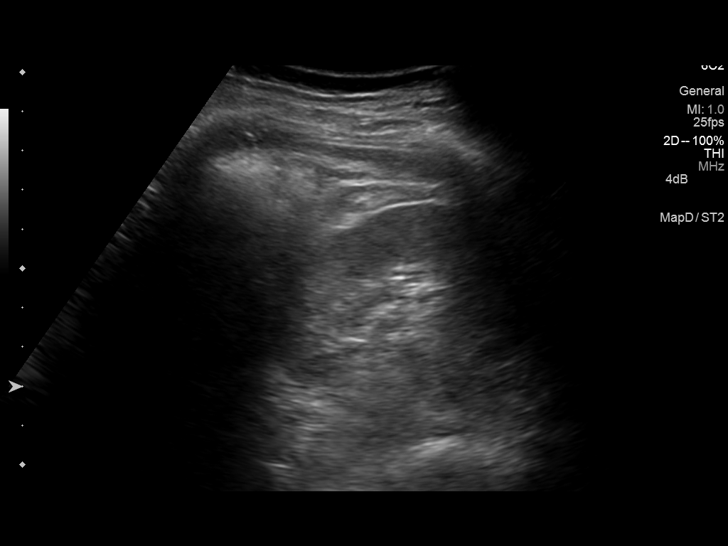
[im 72/96]
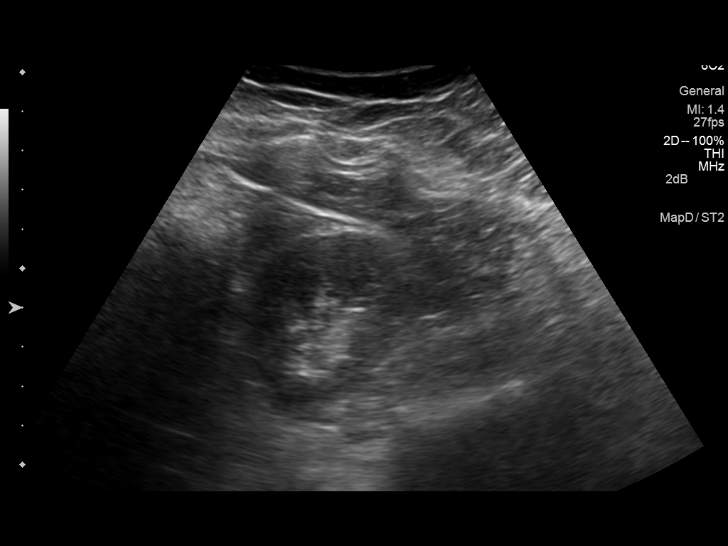
[im 80/96]
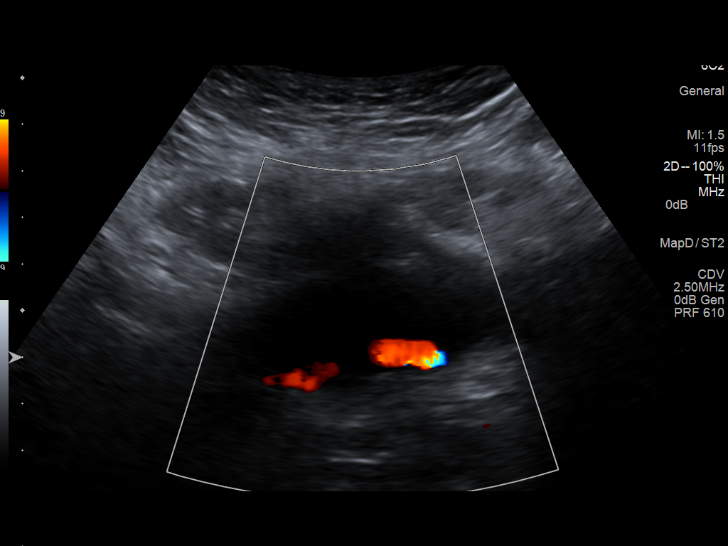
[im 88/96]
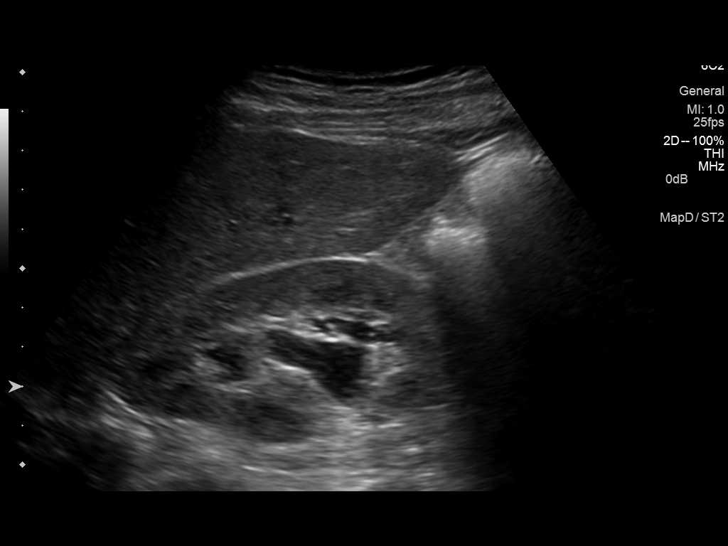
[im 96/96]
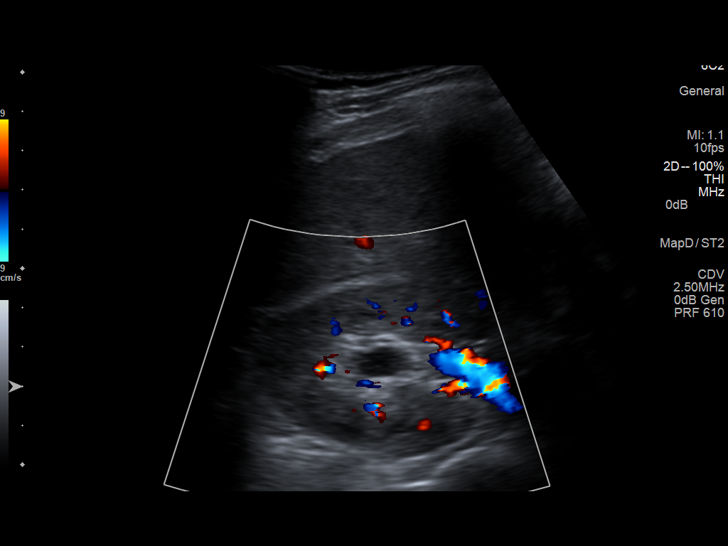

[14 of 25 positions shown; findings below may reference images not displayed]

FINDINGS: Right Kidney:

Length: 10.3 cm. Moderate hydronephrosis. No renal cortical
thinning.

Left Kidney:

Length: 10.0 cm. Echogenicity within normal limits. No mass or
hydronephrosis visualized.

Bladder:

Within normal limits.  Bilateral ureteric jets identified.
IMPRESSION: Moderate right-sided hydronephrosis. No cause identified. Consider
CT stone study if suspicion of obstructive stone. If suspicion of
obstructive mass, consider hematuria protocol CT.

## 2015-03-25 ENCOUNTER — Emergency Department (HOSPITAL_COMMUNITY)
Admission: EM | Admit: 2015-03-25 | Discharge: 2015-03-25 | Disposition: A | Discharge status: Home / Self Care | Payer: Medicaid Other | Attending: Emergency Medicine | Admitting: Emergency Medicine

## 2015-03-25 ENCOUNTER — Encounter (HOSPITAL_COMMUNITY): Payer: Self-pay | Admitting: Emergency Medicine

## 2015-03-25 DIAGNOSIS — R51 Headache: Secondary | ICD-10-CM

## 2015-03-25 DIAGNOSIS — Z8719 Personal history of other diseases of the digestive system: Secondary | ICD-10-CM | POA: Diagnosis not present

## 2015-03-25 DIAGNOSIS — Z79899 Other long term (current) drug therapy: Secondary | ICD-10-CM | POA: Diagnosis not present

## 2015-03-25 DIAGNOSIS — R519 Headache, unspecified: Secondary | ICD-10-CM

## 2015-03-25 DIAGNOSIS — G43909 Migraine, unspecified, not intractable, without status migrainosus: Secondary | ICD-10-CM | POA: Diagnosis not present

## 2015-03-25 DIAGNOSIS — M797 Fibromyalgia: Secondary | ICD-10-CM | POA: Insufficient documentation

## 2015-03-25 DIAGNOSIS — Z86718 Personal history of other venous thrombosis and embolism: Secondary | ICD-10-CM | POA: Insufficient documentation

## 2015-03-25 DIAGNOSIS — F419 Anxiety disorder, unspecified: Secondary | ICD-10-CM | POA: Insufficient documentation

## 2015-03-25 DIAGNOSIS — R55 Syncope and collapse: Secondary | ICD-10-CM | POA: Diagnosis present

## 2015-03-25 LAB — CBC WITH DIFFERENTIAL/PLATELET
Basophils Absolute: 0 10*3/uL (ref 0.0–0.1)
Basophils Relative: 0 %
Eosinophils Absolute: 0.1 10*3/uL (ref 0.0–0.7)
Eosinophils Relative: 1 %
HCT: 44.3 % (ref 36.0–46.0)
Hemoglobin: 14.6 g/dL (ref 12.0–15.0)
Lymphocytes Relative: 33 %
Lymphs Abs: 2.1 10*3/uL (ref 0.7–4.0)
MCH: 29 pg (ref 26.0–34.0)
MCHC: 33 g/dL (ref 30.0–36.0)
MCV: 88.1 fL (ref 78.0–100.0)
Monocytes Absolute: 0.5 10*3/uL (ref 0.1–1.0)
Monocytes Relative: 8 %
Neutro Abs: 3.7 10*3/uL (ref 1.7–7.7)
Neutrophils Relative %: 58 %
Platelets: 342 10*3/uL (ref 150–400)
RBC: 5.03 MIL/uL (ref 3.87–5.11)
RDW: 14.2 % (ref 11.5–15.5)
WBC: 6.4 10*3/uL (ref 4.0–10.5)

## 2015-03-25 LAB — URINALYSIS, ROUTINE W REFLEX MICROSCOPIC
Bilirubin Urine: NEGATIVE
Glucose, UA: NEGATIVE mg/dL
Hgb urine dipstick: NEGATIVE
Ketones, ur: NEGATIVE mg/dL
Leukocytes, UA: NEGATIVE
Nitrite: NEGATIVE
Protein, ur: NEGATIVE mg/dL
Specific Gravity, Urine: 1.028 (ref 1.005–1.030)
Urobilinogen, UA: 1 mg/dL (ref 0.0–1.0)
pH: 5.5 (ref 5.0–8.0)

## 2015-03-25 LAB — BASIC METABOLIC PANEL
Anion gap: 7 (ref 5–15)
BUN: 15 mg/dL (ref 6–20)
CO2: 28 mmol/L (ref 22–32)
Calcium: 9.5 mg/dL (ref 8.9–10.3)
Chloride: 106 mmol/L (ref 101–111)
Creatinine, Ser: 0.95 mg/dL (ref 0.44–1.00)
GFR calc Af Amer: >60 mL/min (ref >60)
GFR calc non Af Amer: >60 mL/min (ref >60)
Glucose, Bld: 92 mg/dL (ref 65–99)
Potassium: 3.4 mmol/L — ABNORMAL LOW (ref 3.5–5.1)
Sodium: 141 mmol/L (ref 135–145)

## 2015-03-25 MED ORDER — KETOROLAC TROMETHAMINE 30 MG/ML IJ SOLN
30.0000 mg | Freq: Once | INTRAMUSCULAR | Status: AC
  Administered 2015-03-25: 30 mg via INTRAVENOUS
  Filled 2015-03-25: qty 1

## 2015-03-25 MED ORDER — DIPHENHYDRAMINE HCL 25 MG PO CAPS
25.0000 mg | ORAL_CAPSULE | Freq: Once | ORAL | Status: AC
  Administered 2015-03-25: 25 mg via ORAL
  Filled 2015-03-25: qty 1

## 2015-03-25 MED ORDER — SODIUM CHLORIDE 0.9 % IV BOLUS (SEPSIS)
1000.0000 mL | Freq: Once | INTRAVENOUS | Status: AC
  Administered 2015-03-25: 1000 mL via INTRAVENOUS

## 2015-03-25 MED ORDER — KETOROLAC TROMETHAMINE 30 MG/ML IJ SOLN: 30.0000 mg | Freq: Once | INTRAMUSCULAR | Status: DC

## 2015-03-25 MED ORDER — METOCLOPRAMIDE HCL 10 MG PO TABS
10.0000 mg | ORAL_TABLET | Freq: Once | ORAL | Status: AC
  Administered 2015-03-25: 10 mg via ORAL
  Filled 2015-03-25: qty 1

## 2015-03-25 NOTE — Discharge Instructions (Signed)
General Headache Without Cause °A headache is pain or discomfort felt around the head or neck area. There are many causes and types of headaches. In some cases, the cause may not be found.  °HOME CARE  °Managing Pain °· Take over-the-counter and prescription medicines only as told by your doctor. °· Lie down in a dark, quiet room when you have a headache. °· If directed, apply ice to the head and neck area: °¨ Put ice in a plastic bag. °¨ Place a towel between your skin and the bag. °¨ Leave the ice on for 20 minutes, 2-3 times per day. °· Use a heating pad or hot shower to apply heat to the head and neck area as told by your doctor. °· Keep lights dim if bright lights bother you or make your headaches worse. °Eating and Drinking °· Eat meals on a regular schedule. °· Lessen how much alcohol you drink. °· Lessen how much caffeine you drink, or stop drinking caffeine. °General Instructions °· Keep all follow-up visits as told by your doctor. This is important. °· Keep a journal to find out if certain things bring on headaches. For example, write down: °¨ What you eat and drink. °¨ How much sleep you get. °¨ Any change to your diet or medicines. °· Relax by getting a massage or doing other relaxing activities. °· Lessen stress. °· Sit up straight. Do not tighten (tense) your muscles. °· Do not use tobacco products. This includes cigarettes, chewing tobacco, or e-cigarettes. If you need help quitting, ask your doctor. °· Exercise regularly as told by your doctor. °· Get enough sleep. This often means 7-9 hours of sleep. °GET HELP IF: °· Your symptoms are not helped by medicine. °· You have a headache that feels different than the other headaches. °· You feel sick to your stomach (nauseous) or you throw up (vomit). °· You have a fever. °GET HELP RIGHT AWAY IF:  °· Your headache becomes really bad. °· You keep throwing up. °· You have a stiff neck. °· You have trouble seeing. °· You have trouble speaking. °· You have  pain in the eye or ear. °· Your muscles are weak or you lose muscle control. °· You lose your balance or have trouble walking. °· You feel like you will pass out (faint) or you pass out. °· You have confusion. °  °This information is not intended to replace advice given to you by your health care provider. Make sure you discuss any questions you have with your health care provider. °  °Document Released: 02/05/2008 Document Revised: 01/17/2015 Document Reviewed: 08/21/2014 °Elsevier Interactive Patient Education ©2016 [employer] ° °Please read attached information. If you experience any new or worsening signs or symptoms please return to the emergency room for evaluation. Please follow-up with your primary care provider or specialist as discussed. Please use medication prescribed only as directed and discontinue taking if you have any concerning signs or symptoms.  °

## 2015-03-25 NOTE — ED Notes (Signed)
Pt states hx of migraines.  Took an elavil.  States that she had a migraine yesterday and states that she passed out yesterday about 11pm.  States that her boyfriend picked her up and put her in the bed and she didn't wake up till 0130.  States she feels lightheaded today.

## 2015-03-25 NOTE — ED Provider Notes (Signed)
CSN: [id number]     Arrival date & time 03/25/15  1055 History   First MD Initiated Contact with Patient 03/25/15 1123     Chief Complaint  Patient presents with  . Loss of Consciousness   HPI   54 year old female presents today with reported syncope last night. Patient reports that she took 2 Elavil for a migraine that has been persistent for greater than a week, and reports she felt lightheaded and "passed out " into her boyfriend's arms around 11 PM. She states that her boyfriend placed her into bed, and she did not wake up until around 1:30. Patient reports she slept through the night, woke up with persistent headache and lightheadedness. Patient reports the symptoms have been persistent, unchanged, similar to previous episodes of headaches. Patient has been seen by numerous providers for headaches, currently prescribed Elavil for this. Patient denies any trauma to the head, fever, chills, neck stiffness, dizziness, changes in vision smelt taste, or any focal neurological deficits. Patient reports that she has not been drinking very much water lately, feels dehydrated, with a dry mouth. Chest pain, shortness of breath, abdominal pain, lotion swelling or edema, changes in the color clarity or characteristics of her urine. She describes the headache as frontal, throbbing, with sensitivity to light, no nausea or vomiting.   Past Medical History  Diagnosis Date  . ANXIETY 11/16/2009  . CHEST WALL PAIN, ACUTE 04/16/2010  . FATIGUE 03/28/2009  . KNEE PAIN 02/04/2010  . MENTAL CONFUSION 11/16/2009  . MIGRAINE, COMMON W/INTRACTABLE MIGRAINE 03/28/2009  . Nausea alone 03/28/2009  . Hernia   . Phlebitis, complicating pregnancy or puerperium   . DVT (deep venous thrombosis) (HCC)   . Vertigo   . Fibromyalgia   . Constipation    Past Surgical History  Procedure Laterality Date  . Hernia repair    . Cesarean section    . Endometrial ablation     Family History  Problem Relation Age of Onset  .  Diabetes Other   . Hypertension Other   . Kidney disease Other   . Diabetes Mother   . Heart disease Mother   . Kidney disease Mother   . Hypertension Mother   . Stroke Mother    Social History  Substance Use Topics  . Smoking status: Never Smoker   . Smokeless tobacco: None  . Alcohol Use: No   OB History    No data available     Review of Systems  All other systems reviewed and are negative.   Allergies  Blueberry; Grapeseed extract; and Topiramate  Home Medications   Prior to Admission medications   Medication Sig Start Date End Date Taking? Authorizing Provider  amitriptyline (ELAVIL) 50 MG tablet Take 100 mg by mouth at bedtime.    Yes Historical Provider, MD  cyclobenzaprine (FLEXERIL) 10 MG tablet Take 0.5 tablets (5 mg total) by mouth 3 (three) times daily as needed for muscle spasms. Patient not taking: Reported on 03/25/2015 03/28/14   Mercedes Camprubi-Soms, PA-C  gabapentin (NEURONTIN) 300 MG capsule Take 300 mg by mouth 3 (three) times daily.    Historical Provider, MD  naproxen (NAPROSYN) 500 MG tablet Take 1 tablet (500 mg total) by mouth 2 (two) times daily as needed for mild pain, moderate pain or headache (TAKE WITH MEALS.). Patient not taking: Reported on 03/25/2015 03/28/14   Mercedes Camprubi-Soms, PA-C  ondansetron (ZOFRAN) 4 MG tablet Take 1 tablet (4 mg total) by mouth every 8 (eight) hours as needed for  nausea or vomiting. Patient not taking: Reported on 03/25/2015 11/06/13   Francee Piccolo, PA-C  oxyCODONE-acetaminophen (PERCOCET/ROXICET) 5-325 MG per tablet Take 1-2 tablets by mouth every 6 (six) hours as needed for moderate pain or severe pain. Patient not taking: Reported on 03/25/2015 05/13/14   Teressa Lower, NP  tamsulosin (FLOMAX) 0.4 MG CAPS capsule Take 1 capsule (0.4 mg total) by mouth daily. Patient not taking: Reported on 03/25/2015 11/06/13   Victorino Dike Piepenbrink, PA-C   BP 135/76 mmHg  Pulse 92  Temp(Src) 98.6 F (37 C) (Oral)   Resp 16  SpO2 100%   Physical Exam  Constitutional: She is oriented to person, place, and time. She appears well-developed and well-nourished.  HENT:  Head: Normocephalic and atraumatic.  Eyes: Conjunctivae are normal. Pupils are equal, round, and reactive to light. Right eye exhibits no discharge. Left eye exhibits no discharge. No scleral icterus.  Neck: Normal range of motion. No JVD present. No tracheal deviation present.  Cardiovascular: Regular rhythm, normal heart sounds and intact distal pulses.  Exam reveals no gallop and no friction rub.   No murmur heard. Pulmonary/Chest: Breath sounds normal. No stridor. No respiratory distress. She has no wheezes. She has no rales. She exhibits no tenderness.  Abdominal: Soft. There is no tenderness.  Musculoskeletal: Normal range of motion. She exhibits no edema or tenderness.  Neurological: She is alert and oriented to person, place, and time. She has normal strength. No cranial nerve deficit or sensory deficit. She displays a negative Romberg sign. Coordination normal. GCS eye subscore is 4. GCS verbal subscore is 5. GCS motor subscore is 6.  Reflex Scores:      Patellar reflexes are 2+ on the right side and 2+ on the left side. Skin: Skin is warm and dry. No rash noted. No erythema. No pallor.  Psychiatric: She has a normal mood and affect. Her behavior is normal. Judgment and thought content normal.  Nursing note and vitals reviewed.     ED Course  Procedures (including critical care time) Labs Review Labs Reviewed  BASIC METABOLIC PANEL - Abnormal; Notable for the following:    Potassium 3.4 (*)    All other components within normal limits  CBC WITH DIFFERENTIAL/PLATELET  URINALYSIS, ROUTINE W REFLEX MICROSCOPIC (NOT AT [facility])    Imaging Review No results found. I have personally reviewed and evaluated these images and lab results as part of my medical decision-making.   EKG Interpretation   Date/Time:  Sunday March 25 2015 11:19:30 EST Ventricular Rate:  101 PR Interval:  154 QRS Duration: 70 QT Interval:  337 QTC Calculation: 437 R Axis:   67 Text Interpretation:  Sinus tachycardia Left atrial enlargement Probable  left ventricular hypertrophy Baseline wander in lead(s) II aVR ED  PHYSICIAN INTERPRETATION AVAILABLE IN [facility] Confirmed by TEST,  Record ([id number]) on 03/25/2015 12:03:07 PM      MDM   Final diagnoses:  Acute nonintractable headache, unspecified headache type    Labs: CBC, BMP, urinalysis-no significant findings  Imaging: EKG  Consults:  Therapeutics: Toradol, Reglan, Benadryl, normal saline  Discharge Meds:   Assessment/Plan: 55 year old female presents today with a headache. Patient reports she had syncope last night after taking L fill. She reports that when she takes this medication she gets sleepy and falls asleep. This was unlikely to be a true syncopal event. Patient reports that she has not been maintaining adequate by mouth intake, that combined with the Elavil is likely caused her extreme fatigue. She reports  a headache that has been persistent, improved here in the ED with above medications. No red flags for headache today, no concern for acute intracranial abnormality or meningitis. Patient will be discharged home with instructions to contact her primary care for reevaluation, follow-up with neurology if headaches continue to persist. Patient is given strict return precautions, she verbalizes understanding and is to today's plan.         Eyvonne Mechanic, PA-C 03/25/15 1612  Lorre Nick, MD 03/28/15 [phone]

## 2015-05-07 ENCOUNTER — Encounter (HOSPITAL_COMMUNITY): Payer: Self-pay

## 2015-05-07 ENCOUNTER — Emergency Department (INDEPENDENT_AMBULATORY_CARE_PROVIDER_SITE_OTHER)
Admission: EM | Admit: 2015-05-07 | Discharge: 2015-05-07 | Disposition: A | Discharge status: Home / Self Care | Payer: Medicaid Other | Source: Home / Self Care

## 2015-05-07 DIAGNOSIS — S46812A Strain of other muscles, fascia and tendons at shoulder and upper arm level, left arm, initial encounter: Secondary | ICD-10-CM

## 2015-05-07 MED ORDER — HYDROCODONE-ACETAMINOPHEN 5-325 MG PO TABS: 2.0000 | ORAL_TABLET | ORAL | Status: DC | PRN

## 2015-05-07 MED ORDER — CYCLOBENZAPRINE HCL 10 MG PO TABS: 10.0000 mg | ORAL_TABLET | Freq: Two times a day (BID) | ORAL | Status: DC | PRN

## 2015-05-07 NOTE — Discharge Instructions (Signed)
Muscle Strain A muscle strain (pulled muscle) happens when a muscle is stretched beyond normal length. It happens when a sudden, violent force stretches your muscle too far. Usually, a few of the fibers in your muscle are torn. Muscle strain is common in athletes. Recovery usually takes 1-2 weeks. Complete healing takes 5-6 weeks.  HOME CARE   Follow the PRICE method of treatment to help your injury get better. Do this the first 2-3 days after the injury:  Protect. Protect the muscle to keep it from getting injured again.  Rest. Limit your activity and rest the injured body part.  Ice. Put ice in a plastic bag. Place a towel between your skin and the bag. Then, apply the ice and leave it on from 15-20 minutes each hour. After the third day, switch to moist heat packs.  Compression. Use a splint or elastic bandage on the injured area for comfort. Do not put it on too tightly.  Elevate. Keep the injured body part above the level of your heart.  Only take medicine as told by your doctor.  Warm up before doing exercise to prevent future muscle strains. GET HELP IF:   You have more pain or puffiness (swelling) in the injured area.  You feel numbness, tingling, or notice a loss of strength in the injured area. MAKE SURE YOU:   Understand these instructions.  Will watch your condition.  Will get help right away if you are not doing well or get worse.   This information is not intended to replace advice given to you by your health care provider. Make sure you discuss any questions you have with your health care provider.   Document Released: 02/05/2008 Document Revised: 02/16/2013 Document Reviewed: 11/25/2012 Elsevier Interactive Patient Education 2016 [employer]  HEAT COMPRESSES TO THE NECK iBUPROFEN

## 2015-05-07 NOTE — ED Provider Notes (Signed)
CSN: [id number]     Arrival date & time 05/07/15  1841 History   None    Chief Complaint  Patient presents with  . Shoulder Pain   (Consider location/radiation/quality/duration/timing/severity/associated sxs/prior Treatment) HPI History obtained from patient:   LOCATION:left shoulder, neck SEVERITY:6 DURATION:4 days  CONTEXT:unsure, just notec pain and swelling in the area, lifting and playing with grand kids QUALITY: MODIFYING FACTORS: ice packs ibuprofen without relief ASSOCIATED SYMPTOMS:pain with movement of neck and arm TIMING: now constant    Past Medical History  Diagnosis Date  . ANXIETY 11/16/2009  . CHEST WALL PAIN, ACUTE 04/16/2010  . FATIGUE 03/28/2009  . KNEE PAIN 02/04/2010  . MENTAL CONFUSION 11/16/2009  . MIGRAINE, COMMON W/INTRACTABLE MIGRAINE 03/28/2009  . Nausea alone 03/28/2009  . Hernia   . Phlebitis, complicating pregnancy or puerperium   . DVT (deep venous thrombosis) (HCC)   . Vertigo   . Fibromyalgia   . Constipation    Past Surgical History  Procedure Laterality Date  . Hernia repair    . Cesarean section    . Endometrial ablation     Family History  Problem Relation Age of Onset  . Diabetes Other   . Hypertension Other   . Kidney disease Other   . Diabetes Mother   . Heart disease Mother   . Kidney disease Mother   . Hypertension Mother   . Stroke Mother    Social History  Substance Use Topics  . Smoking status: Never Smoker   . Smokeless tobacco: None  . Alcohol Use: No   OB History    No data available     Review of Systems ROS +'ve left shoulder  Neck pain  Denies: HEADACHE, NAUSEA, ABDOMINAL PAIN, CHEST PAIN, CONGESTION, DYSURIA, SHORTNESS OF BREATH  Allergies  Blueberry; Grapeseed extract; and Topiramate  Home Medications   Prior to Admission medications   Medication Sig Start Date End Date Taking? Authorizing Provider  amitriptyline (ELAVIL) 50 MG tablet Take 100 mg by mouth at bedtime.     Historical Provider, MD   cyclobenzaprine (FLEXERIL) 10 MG tablet Take 0.5 tablets (5 mg total) by mouth 3 (three) times daily as needed for muscle spasms. Patient not taking: Reported on 03/25/2015 03/28/14   Mercedes Camprubi-Soms, PA-C  cyclobenzaprine (FLEXERIL) 10 MG tablet Take 1 tablet (10 mg total) by mouth 2 (two) times daily as needed for muscle spasms. 05/07/15   Tharon AquasFrank C Randie Tallarico, PA  gabapentin (NEURONTIN) 300 MG capsule Take 300 mg by mouth 3 (three) times daily.    Historical Provider, MD  HYDROcodone-acetaminophen (NORCO/VICODIN) 5-325 MG tablet Take 2 tablets by mouth every 4 (four) hours as needed. 05/07/15   Tharon AquasFrank C Shamya Macfadden, PA  naproxen (NAPROSYN) 500 MG tablet Take 1 tablet (500 mg total) by mouth 2 (two) times daily as needed for mild pain, moderate pain or headache (TAKE WITH MEALS.). Patient not taking: Reported on 03/25/2015 03/28/14   Mercedes Camprubi-Soms, PA-C  ondansetron (ZOFRAN) 4 MG tablet Take 1 tablet (4 mg total) by mouth every 8 (eight) hours as needed for nausea or vomiting. Patient not taking: Reported on 03/25/2015 11/06/13   Francee PiccoloJennifer Piepenbrink, PA-C  oxyCODONE-acetaminophen (PERCOCET/ROXICET) 5-325 MG per tablet Take 1-2 tablets by mouth every 6 (six) hours as needed for moderate pain or severe pain. Patient not taking: Reported on 03/25/2015 05/13/14   Teressa LowerVrinda Pickering, NP  tamsulosin (FLOMAX) 0.4 MG CAPS capsule Take 1 capsule (0.4 mg total) by mouth daily. Patient not taking: Reported on 03/25/2015 11/06/13  Jennifer Piepenbrink, PA-C   Meds Ordered and Administered this Visit  Medications - No data to display  BP 145/90 mmHg  Pulse 81  Temp(Src) 98.1 F (36.7 C) (Oral)  Resp 16  SpO2 98% No data found.   Physical Exam  Constitutional: She appears well-developed and well-nourished. No distress.  HENT:  Head: Normocephalic and atraumatic.  Neck: Muscular tenderness present. No spinous process tenderness present.      ED Course  Procedures (including critical care  time)  Labs Review Labs Reviewed - No data to display  Imaging Review No results found.   Visual Acuity Review  Right Eye Distance:   Left Eye Distance:   Bilateral Distance:    Right Eye Near:   Left Eye Near:    Bilateral Near:         MDM   1. Trapezius muscle strain, left, initial encounter    Patient is advised to continue home symptomatic treatment. Prescriptions for flexeril and norco are provided to the patient. Patient is advised that if there are new or worsening symptoms or attend the emergency department, or contact primary care provider. Instructions of care provided discharged home in stable condition.  THIS NOTE WAS GENERATED USING A VOICE RECOGNITION SOFTWARE PROGRAM. ALL REASONABLE EFFORTS  WERE MADE TO PROOFREAD THIS DOCUMENT FOR ACCURACY.     Tharon Aquas, PA 05/07/15 2047

## 2015-05-07 NOTE — ED Notes (Signed)
PA evaluation

## 2015-06-29 ENCOUNTER — Other Ambulatory Visit: Payer: Self-pay

## 2015-06-29 DIAGNOSIS — Z1231 Encounter for screening mammogram for malignant neoplasm of breast: Secondary | ICD-10-CM

## 2015-07-12 ENCOUNTER — Ambulatory Visit
Admission: RE | Admit: 2015-07-12 | Discharge: 2015-07-12 | Disposition: A | Discharge status: Home / Self Care | Payer: Medicaid Other | Source: Ambulatory Visit

## 2015-07-12 DIAGNOSIS — Z1231 Encounter for screening mammogram for malignant neoplasm of breast: Secondary | ICD-10-CM

## 2016-01-03 ENCOUNTER — Other Ambulatory Visit: Payer: Self-pay | Admitting: Gastroenterology

## 2016-01-03 DIAGNOSIS — R1310 Dysphagia, unspecified: Secondary | ICD-10-CM

## 2016-01-04 ENCOUNTER — Ambulatory Visit
Admission: RE | Admit: 2016-01-04 | Discharge: 2016-01-04 | Disposition: A | Discharge status: Home / Self Care | Payer: Medicaid Other | Source: Ambulatory Visit | Attending: Gastroenterology | Admitting: Gastroenterology

## 2016-01-04 DIAGNOSIS — R1310 Dysphagia, unspecified: Secondary | ICD-10-CM

## 2016-03-27 ENCOUNTER — Emergency Department (HOSPITAL_COMMUNITY)
Admission: EM | Admit: 2016-03-27 | Discharge: 2016-03-27 | Disposition: A | Discharge status: Home / Self Care | Payer: Medicaid Other | Attending: Emergency Medicine | Admitting: Emergency Medicine

## 2016-03-27 ENCOUNTER — Encounter (HOSPITAL_COMMUNITY): Payer: Self-pay | Admitting: Emergency Medicine

## 2016-03-27 DIAGNOSIS — X58XXXA Exposure to other specified factors, initial encounter: Secondary | ICD-10-CM | POA: Insufficient documentation

## 2016-03-27 DIAGNOSIS — Y929 Unspecified place or not applicable: Secondary | ICD-10-CM | POA: Insufficient documentation

## 2016-03-27 DIAGNOSIS — Y999 Unspecified external cause status: Secondary | ICD-10-CM | POA: Insufficient documentation

## 2016-03-27 DIAGNOSIS — Y939 Activity, unspecified: Secondary | ICD-10-CM | POA: Insufficient documentation

## 2016-03-27 DIAGNOSIS — T161XXA Foreign body in right ear, initial encounter: Secondary | ICD-10-CM | POA: Diagnosis not present

## 2016-03-27 NOTE — ED Provider Notes (Signed)
[facility]-EMERGENCY DEPT Provider Note   CSN: [id number] Arrival date & time: 03/27/16  1125  By signing my name below, I, Avnee Patel, attest that this documentation has been prepared under the direction and in the presence of  Arthor CaptainAbigail Taveon Enyeart, PA-C. Electronically Signed: Clovis PuAvnee Patel, ED Scribe. 03/27/16. 12:36 PM.   History   Chief Complaint Chief Complaint  Patient presents with  . Foreign Body in Ear   The history is provided by the patient. No language interpreter was used.   HPI Comments:  Bailey Patterson is a 55 y.o. female who presents to the Emergency Department with a foreign body(cotton) in her right ear s/p an incident which occurred in the AM today. Pt states she was using a q-tip when a piece broke off in her ear. No alleviating factors noted. Pt denies any ear pain and any other symptoms or any modifying factors at this time.   Past Medical History:  Diagnosis Date  . ANXIETY 11/16/2009  . CHEST WALL PAIN, ACUTE 04/16/2010  . Constipation   . DVT (deep venous thrombosis) (HCC)   . FATIGUE 03/28/2009  . Fibromyalgia   . Hernia   . KNEE PAIN 02/04/2010  . MENTAL CONFUSION 11/16/2009  . MIGRAINE, COMMON W/INTRACTABLE MIGRAINE 03/28/2009  . Nausea alone 03/28/2009  . Phlebitis, complicating pregnancy or puerperium   . Vertigo     Patient Active Problem List   Diagnosis Date Noted  . Hematochezia 08/11/2012  . Rectal pain, chronic 08/11/2012  . Unspecified constipation 08/11/2012  . Migraine headache 12/20/2010  . Paresthesia 12/20/2010  . CHEST WALL PAIN, ACUTE 04/16/2010  . KNEE PAIN 02/04/2010  . MENTAL CONFUSION 11/16/2009  . ANXIETY 11/16/2009  . FATIGUE 03/28/2009  . Nausea alone 03/28/2009    Past Surgical History:  Procedure Laterality Date  . CESAREAN SECTION    . ENDOMETRIAL ABLATION    . HERNIA REPAIR      OB History    No data available       Home Medications    Prior to Admission medications   Medication Sig Start Date End Date Taking?  Authorizing Provider  amitriptyline (ELAVIL) 50 MG tablet Take 100 mg by mouth at bedtime.     Historical Provider, MD  cyclobenzaprine (FLEXERIL) 10 MG tablet Take 0.5 tablets (5 mg total) by mouth 3 (three) times daily as needed for muscle spasms. Patient not taking: Reported on 03/25/2015 03/28/14   Mercedes Camprubi-Soms, PA-C  cyclobenzaprine (FLEXERIL) 10 MG tablet Take 1 tablet (10 mg total) by mouth 2 (two) times daily as needed for muscle spasms. 05/07/15   Tharon AquasFrank C Patrick, PA  gabapentin (NEURONTIN) 300 MG capsule Take 300 mg by mouth 3 (three) times daily.    Historical Provider, MD  HYDROcodone-acetaminophen (NORCO/VICODIN) 5-325 MG tablet Take 2 tablets by mouth every 4 (four) hours as needed. 05/07/15   Tharon AquasFrank C Patrick, PA  naproxen (NAPROSYN) 500 MG tablet Take 1 tablet (500 mg total) by mouth 2 (two) times daily as needed for mild pain, moderate pain or headache (TAKE WITH MEALS.). Patient not taking: Reported on 03/25/2015 03/28/14   Mercedes Camprubi-Soms, PA-C  ondansetron (ZOFRAN) 4 MG tablet Take 1 tablet (4 mg total) by mouth every 8 (eight) hours as needed for nausea or vomiting. Patient not taking: Reported on 03/25/2015 11/06/13   Francee PiccoloJennifer Piepenbrink, PA-C  oxyCODONE-acetaminophen (PERCOCET/ROXICET) 5-325 MG per tablet Take 1-2 tablets by mouth every 6 (six) hours as needed for moderate pain or severe pain. Patient not taking: Reported  on 03/25/2015 05/13/14   Teressa Lower, NP  tamsulosin (FLOMAX) 0.4 MG CAPS capsule Take 1 capsule (0.4 mg total) by mouth daily. Patient not taking: Reported on 03/25/2015 11/06/13   Francee Piccolo, PA-C    Family History Family History  Problem Relation Age of Onset  . Diabetes Other   . Hypertension Other   . Kidney disease Other   . Diabetes Mother   . Heart disease Mother   . Kidney disease Mother   . Hypertension Mother   . Stroke Mother     Social History Social History  Substance Use Topics  . Smoking status:  Never Smoker  . Smokeless tobacco: Not on file  . Alcohol use No     Allergies   Blueberry [vaccinium angustifolium]; Grapeseed extract [nutritional supplements]; and Topiramate   Review of Systems Review of Systems  HENT: Negative for ear pain.        +foreign body in ear  Neurological: Negative for headaches.    Physical Exam Updated Vital Signs BP 134/59 (BP Location: Right Arm)   Pulse 103   Temp 98.5 F (36.9 C) (Oral)   Resp 18   Ht 5' 2.5" (1.588 m)   Wt 118 lb (53.5 kg)   SpO2 98%   BMI 21.24 kg/m   Physical Exam  Constitutional: She is oriented to person, place, and time. She appears well-developed and well-nourished. No distress.  HENT:  Head: Normocephalic and atraumatic.  Eyes: Conjunctivae are normal.  Cardiovascular: Normal rate.   Pulmonary/Chest: Effort normal.  Abdominal: She exhibits no distension.  Neurological: She is alert and oriented to person, place, and time.  Skin: Skin is warm and dry.  Psychiatric: She has a normal mood and affect.  Nursing note and vitals reviewed.    ED Treatments / Results  DIAGNOSTIC STUDIES:  Oxygen Saturation is 98% on RA, normal by my interpretation.    COORDINATION OF CARE:  12:19 PM Discussed treatment plan with pt at bedside and pt agreed to plan.  Labs (all labs ordered are listed, but only abnormal results are displayed) Labs Reviewed - No data to display  EKG  EKG Interpretation None       Radiology No results found.  Procedures .Foreign Body Removal Date/Time: 03/27/2016 12:22 PM Performed by: Arthor Captain Authorized by: Arthor Captain  Consent: Verbal consent obtained. Consent given by: patient Patient understanding: patient states understanding of the procedure being performed Patient consent: the patient's understanding of the procedure matches consent given Procedure consent: procedure consent matches procedure scheduled Relevant documents: relevant documents present and  verified Patient identity confirmed: verbally with patient Body area: ear Location details: right ear  Sedation: Patient sedated: no Patient restrained: no Patient cooperative: yes Localization method: visualized Removal mechanism: alligator forceps Complexity: simple 1 objects recovered. Objects recovered: cotton  Post-procedure assessment: foreign body removed Patient tolerance: Patient tolerated the procedure well with no immediate complications   (including critical care time)  Medications Ordered in ED Medications - No data to display   Initial Impression / Assessment and Plan / ED Course  I have reviewed the triage vital signs and the nursing notes.  Pertinent labs & imaging results that were available during my care of the patient were reviewed by me and considered in my medical decision making (see chart for details).  Clinical Course     At this time there does not appear to be any evidence of an acute emergency medical condition and the patient appears stable for discharge.  Diagnosis was discussed with patient who verbalizes understanding and is agreeable to discharge.    Final Clinical Impressions(s) / ED Diagnoses   Final diagnoses:  Foreign body of right ear, initial encounter   Patient with cotton ball in her right ear. Easily removed with forceps. No signs of infection or trauma to the canal. Appears safe for discharge at this time. New Prescriptions New Prescriptions   No medications on file  I personally performed the services described in this documentation, which was scribed in my presence. The recorded information has been reviewed and is accurate.       Arthor Captainbigail Susa Bones, PA-C 03/28/16 1647    Lyndal Pulleyaniel Knott, MD 03/31/16 [phone]

## 2016-03-27 NOTE — ED Triage Notes (Signed)
Pt sts end of q-tip in right ear

## 2016-04-09 ENCOUNTER — Emergency Department (HOSPITAL_COMMUNITY): Payer: Medicaid Other

## 2016-04-09 ENCOUNTER — Emergency Department (HOSPITAL_COMMUNITY)
Admission: EM | Admit: 2016-04-09 | Discharge: 2016-04-09 | Disposition: A | Discharge status: Home / Self Care | Payer: Medicaid Other | Attending: Emergency Medicine | Admitting: Emergency Medicine

## 2016-04-09 ENCOUNTER — Encounter (HOSPITAL_COMMUNITY): Payer: Self-pay | Admitting: *Deleted

## 2016-04-09 DIAGNOSIS — K219 Gastro-esophageal reflux disease without esophagitis: Secondary | ICD-10-CM | POA: Insufficient documentation

## 2016-04-09 DIAGNOSIS — R0789 Other chest pain: Secondary | ICD-10-CM | POA: Diagnosis present

## 2016-04-09 LAB — HEPATIC FUNCTION PANEL
ALK PHOS: 78 U/L (ref 38–126)
ALT: 19 U/L (ref 14–54)
AST: 20 U/L (ref 15–41)
Albumin: 4 g/dL (ref 3.5–5.0)
BILIRUBIN TOTAL: 0.5 mg/dL (ref 0.3–1.2)
Total Protein: 7.4 g/dL (ref 6.5–8.1)

## 2016-04-09 LAB — D-DIMER, QUANTITATIVE (NOT AT ARMC): D DIMER QUANT: 0.35 ug{FEU}/mL (ref 0.00–0.50)

## 2016-04-09 LAB — BASIC METABOLIC PANEL
Anion gap: 9 (ref 5–15)
BUN: 12 mg/dL (ref 6–20)
CALCIUM: 9.6 mg/dL (ref 8.9–10.3)
CO2: 24 mmol/L (ref 22–32)
CREATININE: 0.76 mg/dL (ref 0.44–1.00)
Chloride: 107 mmol/L (ref 101–111)
GFR calc Af Amer: >60 mL/min (ref >60)
GFR calc non Af Amer: >60 mL/min (ref >60)
GLUCOSE: 97 mg/dL (ref 65–99)
Potassium: 4.3 mmol/L (ref 3.5–5.1)
Sodium: 140 mmol/L (ref 135–145)

## 2016-04-09 LAB — CBC
HCT: 41.1 % (ref 36.0–46.0)
Hemoglobin: 13.4 g/dL (ref 12.0–15.0)
MCH: 28.2 pg (ref 26.0–34.0)
MCHC: 32.6 g/dL (ref 30.0–36.0)
MCV: 86.5 fL (ref 78.0–100.0)
PLATELETS: 330 10*3/uL (ref 150–400)
RBC: 4.75 MIL/uL (ref 3.87–5.11)
RDW: 13.5 % (ref 11.5–15.5)
WBC: 4.6 10*3/uL (ref 4.0–10.5)

## 2016-04-09 LAB — I-STAT TROPONIN, ED: TROPONIN I, POC: 0 ng/mL (ref 0.00–0.08)

## 2016-04-09 LAB — LIPASE, BLOOD: Lipase: 39 U/L (ref 11–51)

## 2016-04-09 MED ORDER — ACETAMINOPHEN 500 MG PO TABS: 1000.0000 mg | ORAL_TABLET | Freq: Four times a day (QID) | ORAL | 0 refills | Status: DC | PRN

## 2016-04-09 MED ORDER — OMEPRAZOLE 20 MG PO CPDR: 20.0000 mg | DELAYED_RELEASE_CAPSULE | Freq: Every day | ORAL | 0 refills | Status: DC

## 2016-04-09 MED ORDER — GI COCKTAIL ~~LOC~~
30.0000 mL | Freq: Once | ORAL | Status: AC
  Administered 2016-04-09: 30 mL via ORAL
  Filled 2016-04-09: qty 30

## 2016-04-09 NOTE — ED Triage Notes (Signed)
Pt reports onset of left side chest pain this am 0230. Pain radiates into right shoulder and abd, pt thinks its possibly heartburn. ekg done at triage, no acute distress noted.

## 2016-04-09 NOTE — ED Provider Notes (Signed)
[facility]-EMERGENCY DEPT Provider Note   CSN: [id number] Arrival date & time: 04/09/16  [id number]     History   Chief Complaint Chief Complaint  Patient presents with  . Chest Pain    HPI Bailey Patterson is a 55 y.o. female.  HPI Onset of chest pain 2:30 AM. Patient reports has both aching and burning quality. Discomfort in the epigastrium, left medial chest and bilateral shoulders. Patient denies significant shortness of breath. Thought might be heartburn and has been trying to drink water all morning. Symptoms have remained persistent. No nausea or vomiting. No diarrhea. Last meal before going to bed was spaghetti at 5:30 PM. No lower extremity swelling or calf pain.  No history of smoking, hypertension or hyperlipidemia.  Family history: Father with pacemaker and cancer. Siblings no early onset coronary artery disease. Past Medical History:  Diagnosis Date  . ANXIETY 11/16/2009  . CHEST WALL PAIN, ACUTE 04/16/2010  . Constipation   . DVT (deep venous thrombosis) (HCC)   . FATIGUE 03/28/2009  . Fibromyalgia   . Hernia   . KNEE PAIN 02/04/2010  . MENTAL CONFUSION 11/16/2009  . MIGRAINE, COMMON W/INTRACTABLE MIGRAINE 03/28/2009  . Nausea alone 03/28/2009  . Phlebitis, complicating pregnancy or puerperium   . Vertigo     Patient Active Problem List   Diagnosis Date Noted  . Hematochezia 08/11/2012  . Rectal pain, chronic 08/11/2012  . Unspecified constipation 08/11/2012  . Migraine headache 12/20/2010  . Paresthesia 12/20/2010  . CHEST WALL PAIN, ACUTE 04/16/2010  . KNEE PAIN 02/04/2010  . MENTAL CONFUSION 11/16/2009  . ANXIETY 11/16/2009  . FATIGUE 03/28/2009  . Nausea alone 03/28/2009    Past Surgical History:  Procedure Laterality Date  . CESAREAN SECTION    . ENDOMETRIAL ABLATION    . HERNIA REPAIR      OB History    No data available       Home Medications    Prior to Admission medications   Medication Sig Start Date End Date Taking? Authorizing Provider    amitriptyline (ELAVIL) 50 MG tablet Take 100 mg by mouth at bedtime.    Yes Historical Provider, MD  acetaminophen (TYLENOL) 500 MG tablet Take 2 tablets (1,000 mg total) by mouth every 6 (six) hours as needed. 04/09/16   Arby BarretteMarcy Kyrstin Campillo, MD  cyclobenzaprine (FLEXERIL) 10 MG tablet Take 0.5 tablets (5 mg total) by mouth 3 (three) times daily as needed for muscle spasms. Patient not taking: Reported on 04/09/2016 03/28/14   Mercedes Camprubi-Soms, PA-C  cyclobenzaprine (FLEXERIL) 10 MG tablet Take 1 tablet (10 mg total) by mouth 2 (two) times daily as needed for muscle spasms. Patient not taking: Reported on 04/09/2016 05/07/15   Tharon AquasFrank C Patrick, PA  gabapentin (NEURONTIN) 300 MG capsule Take 300 mg by mouth 3 (three) times daily.    Historical Provider, MD  HYDROcodone-acetaminophen (NORCO/VICODIN) 5-325 MG tablet Take 2 tablets by mouth every 4 (four) hours as needed. Patient not taking: Reported on 04/09/2016 05/07/15   Tharon AquasFrank C Patrick, PA  naproxen (NAPROSYN) 500 MG tablet Take 1 tablet (500 mg total) by mouth 2 (two) times daily as needed for mild pain, moderate pain or headache (TAKE WITH MEALS.). Patient not taking: Reported on 04/09/2016 03/28/14   Mercedes Camprubi-Soms, PA-C  omeprazole (PRILOSEC) 20 MG capsule Take 1 capsule (20 mg total) by mouth daily. 04/09/16   Arby BarretteMarcy Sherrod Toothman, MD  ondansetron (ZOFRAN) 4 MG tablet Take 1 tablet (4 mg total) by mouth every 8 (eight) hours as needed  for nausea or vomiting. Patient not taking: Reported on 04/09/2016 11/06/13   Francee PiccoloJennifer Piepenbrink, PA-C  oxyCODONE-acetaminophen (PERCOCET/ROXICET) 5-325 MG per tablet Take 1-2 tablets by mouth every 6 (six) hours as needed for moderate pain or severe pain. Patient not taking: Reported on 04/09/2016 05/13/14   Teressa LowerVrinda Pickering, NP  tamsulosin (FLOMAX) 0.4 MG CAPS capsule Take 1 capsule (0.4 mg total) by mouth daily. Patient not taking: Reported on 04/09/2016 11/06/13   Francee PiccoloJennifer Piepenbrink, PA-C    Family  History Family History  Problem Relation Age of Onset  . Diabetes Mother   . Heart disease Mother   . Kidney disease Mother   . Hypertension Mother   . Stroke Mother   . Diabetes Other   . Hypertension Other   . Kidney disease Other     Social History Social History  Substance Use Topics  . Smoking status: Never Smoker  . Smokeless tobacco: Not on file  . Alcohol use No     Allergies   Blueberry [vaccinium angustifolium]; Grapeseed extract [nutritional supplements]; Lyrica [pregabalin]; Oxycodone; Topiramate; and Hydrocodone-acetaminophen   Review of Systems Review of Systems 10 Systems reviewed and are negative for acute change except as noted in the HPI.   Physical Exam Updated Vital Signs BP 141/97   Pulse 80   Temp 97.7 F (36.5 C) (Oral)   Resp 12   Ht 5\' 2"  (1.575 m)   Wt 126 lb (57.2 kg)   SpO2 99%   BMI 23.05 kg/m   Physical Exam  Constitutional: She is oriented to person, place, and time. She appears well-developed and well-nourished. No distress.  HENT:  Head: Normocephalic and atraumatic.  Nose: Nose normal.  Mouth/Throat: Oropharynx is clear and moist.  Eyes: Conjunctivae and EOM are normal.  Neck: Neck supple.  Cardiovascular: Normal rate and regular rhythm.   No murmur heard. Pulmonary/Chest: Effort normal and breath sounds normal. No respiratory distress. She exhibits tenderness.  Patient endorses tenderness to palpation of the left sternal costal margins.  Abdominal: Soft. There is tenderness.  Epigastrium tender without guarding. Remainder abdomen soft nontender.  Musculoskeletal: She exhibits no edema, tenderness or deformity.  Neurological: She is alert and oriented to person, place, and time. No cranial nerve deficit. She exhibits normal muscle tone. Coordination normal.  Skin: Skin is warm and dry.  Psychiatric: She has a normal mood and affect.  Nursing note and vitals reviewed.    ED Treatments / Results  Labs (all labs  ordered are listed, but only abnormal results are displayed) Labs Reviewed  HEPATIC FUNCTION PANEL - Abnormal; Notable for the following:       Result Value   Bilirubin, Direct <0.1 (*)    All other components within normal limits  BASIC METABOLIC PANEL  CBC  LIPASE, BLOOD  D-DIMER, QUANTITATIVE (NOT [facility])  I-STAT TROPOININ, ED    EKG  EKG Interpretation  Date/Time:  Wednesday April 09 2016 07:38:42 EST Ventricular Rate:  82 PR Interval:  152 QRS Duration: 76 QT Interval:  372 QTC Calculation: 434 R Axis:   76 Text Interpretation:  Normal sinus rhythm Normal ECG no acute ischemic changes. o sig change from previous, Confirmed by Donnald GarrePfeiffer, MD, Lebron ConnersMarcy [phone]([id number]) on 04/09/2016 11:29:33 AM       Radiology Dg Chest 2 View  Result Date: 04/09/2016 CLINICAL DATA:  Chest pain EXAM: CHEST  2 VIEW COMPARISON:  03/28/2014 FINDINGS: Reticular lung markings in lung bases similar to prior study and may represent scarring or atelectasis. No  new area of infiltrate. Negative for effusion or heart failure. No mass or adenopathy. IMPRESSION: Reticular lung markings in lung bases similar to 2015 may represent scarring. No interval change. Electronically Signed   By: Marlan Palau M.D.   On: 04/09/2016 08:05    Procedures Procedures (including critical care time)  Medications Ordered in ED Medications  gi cocktail (Maalox,Lidocaine,Donnatal) (30 mLs Oral Given 04/09/16 0920)     Initial Impression / Assessment and Plan / ED Course  I have reviewed the triage vital signs and the nursing notes.  Pertinent labs & imaging results that were available during my care of the patient were reviewed by me and considered in my medical decision making (see chart for details).  Clinical Course as of Apr 09 1158  Wed Apr 09, 2016  0910 Glucose: 97 [MP]    Clinical Course User Index [MP] Arby Barrette, MD   Heart score 1  Final Clinical Impressions(s) / ED Diagnoses   Final diagnoses:    Chest wall pain  Gastroesophageal reflux disease, esophagitis presence not specified   Patient is clinically well. Chest pain is reproducible and highly suggestive of chest wall pain. It is also component of reflux-type symptoms with burning quality in the epigastrium. Diagnostic evaluation is within normal limits with no elevation in d-dimer or troponin. EKG does not show ischemic change. At this time, I feel the patient is safe for discharge with symptomatic treatment with 2 weeks of omeprazole and acetaminophen for chest wall pain. Instructions to follow up with her PCP within the next several days. New Prescriptions New Prescriptions   ACETAMINOPHEN (TYLENOL) 500 MG TABLET    Take 2 tablets (1,000 mg total) by mouth every 6 (six) hours as needed.   OMEPRAZOLE (PRILOSEC) 20 MG CAPSULE    Take 1 capsule (20 mg total) by mouth daily.     Arby Barrette, MD 04/09/16 [phone]

## 2016-04-16 ENCOUNTER — Other Ambulatory Visit: Payer: Self-pay | Admitting: Internal Medicine

## 2016-04-16 DIAGNOSIS — E2839 Other primary ovarian failure: Secondary | ICD-10-CM

## 2016-04-29 ENCOUNTER — Ambulatory Visit
Admission: RE | Admit: 2016-04-29 | Discharge: 2016-04-29 | Disposition: A | Discharge status: Home / Self Care | Payer: Medicaid Other | Source: Ambulatory Visit | Attending: Internal Medicine | Admitting: Internal Medicine

## 2016-04-29 DIAGNOSIS — E2839 Other primary ovarian failure: Secondary | ICD-10-CM

## 2016-05-30 ENCOUNTER — Ambulatory Visit: Payer: Medicaid Other | Admitting: Podiatry

## 2016-07-22 ENCOUNTER — Emergency Department (HOSPITAL_COMMUNITY)
Admission: EM | Admit: 2016-07-22 | Discharge: 2016-07-22 | Disposition: A | Discharge status: Home / Self Care | Payer: Medicaid Other | Attending: Emergency Medicine | Admitting: Emergency Medicine

## 2016-07-22 ENCOUNTER — Encounter (HOSPITAL_COMMUNITY): Payer: Self-pay | Admitting: Emergency Medicine

## 2016-07-22 ENCOUNTER — Emergency Department (HOSPITAL_COMMUNITY): Payer: Medicaid Other

## 2016-07-22 DIAGNOSIS — Y9301 Activity, walking, marching and hiking: Secondary | ICD-10-CM | POA: Diagnosis not present

## 2016-07-22 DIAGNOSIS — S43015A Anterior dislocation of left humerus, initial encounter: Secondary | ICD-10-CM | POA: Diagnosis not present

## 2016-07-22 DIAGNOSIS — Y92009 Unspecified place in unspecified non-institutional (private) residence as the place of occurrence of the external cause: Secondary | ICD-10-CM | POA: Diagnosis not present

## 2016-07-22 DIAGNOSIS — Y999 Unspecified external cause status: Secondary | ICD-10-CM | POA: Diagnosis not present

## 2016-07-22 DIAGNOSIS — S4992XA Unspecified injury of left shoulder and upper arm, initial encounter: Secondary | ICD-10-CM | POA: Diagnosis present

## 2016-07-22 DIAGNOSIS — S161XXA Strain of muscle, fascia and tendon at neck level, initial encounter: Secondary | ICD-10-CM

## 2016-07-22 DIAGNOSIS — W19XXXA Unspecified fall, initial encounter: Secondary | ICD-10-CM

## 2016-07-22 DIAGNOSIS — W010XXA Fall on same level from slipping, tripping and stumbling without subsequent striking against object, initial encounter: Secondary | ICD-10-CM | POA: Diagnosis not present

## 2016-07-22 MED ORDER — MORPHINE SULFATE (PF) 4 MG/ML IV SOLN
4.0000 mg | Freq: Once | INTRAVENOUS | Status: AC
  Administered 2016-07-22: 4 mg via INTRAVENOUS
  Filled 2016-07-22: qty 1

## 2016-07-22 MED ORDER — FENTANYL CITRATE (PF) 100 MCG/2ML IJ SOLN
INTRAMUSCULAR | Status: AC
  Administered 2016-07-22: 100 ug
  Filled 2016-07-22: qty 2

## 2016-07-22 MED ORDER — BUPIVACAINE HCL (PF) 0.5 % IJ SOLN
20.0000 mL | Freq: Once | INTRAMUSCULAR | Status: AC
  Administered 2016-07-22: 20 mL
  Filled 2016-07-22: qty 20

## 2016-07-22 MED ORDER — PROPOFOL 10 MG/ML IV BOLUS
1.0000 mg/kg | Freq: Once | INTRAVENOUS | Status: AC
  Administered 2016-07-22: 60 mg via INTRAVENOUS
  Filled 2016-07-22: qty 20

## 2016-07-22 MED ORDER — FENTANYL CITRATE (PF) 100 MCG/2ML IJ SOLN: 50.0000 ug | INTRAMUSCULAR | Status: DC | PRN

## 2016-07-22 NOTE — Sedation Documentation (Signed)
Dr. Jacqulyn BathLong successfully reduced pt L shoulder

## 2016-07-22 NOTE — ED Provider Notes (Signed)
Emergency Department Provider Note   I have reviewed the triage vital signs and the nursing notes.   HISTORY  Chief Complaint Shoulder Injury and Fall   HPI Bailey Patterson is a 56 y.o. female with PMH fo fibromyalgia, DVT, and migraine HA since emergency department for evaluation of severe left shoulder pain after mechanical fall at home. Patient states that she was walking and slipped over it sleeping bag on the floor. She fell to the ground and had severe pain in her left shoulder and forearm. She is also experiencing mild left hip pain but this seems to have resolved. Denies head trauma or LOC. No vomiting since the incident. Pain is severe, constant, and worse with movement. No numbness/tingling. No alleviating factors.   Past Medical History:  Diagnosis Date  . ANXIETY 11/16/2009  . CHEST WALL PAIN, ACUTE 04/16/2010  . Constipation   . DVT (deep venous thrombosis) (HCC)   . FATIGUE 03/28/2009  . Fibromyalgia   . Hernia   . KNEE PAIN 02/04/2010  . MENTAL CONFUSION 11/16/2009  . MIGRAINE, COMMON W/INTRACTABLE MIGRAINE 03/28/2009  . Nausea alone 03/28/2009  . Phlebitis, complicating pregnancy or puerperium   . Vertigo     Patient Active Problem List   Diagnosis Date Noted  . Hematochezia 08/11/2012  . Rectal pain, chronic 08/11/2012  . Unspecified constipation 08/11/2012  . Migraine headache 12/20/2010  . Paresthesia 12/20/2010  . CHEST WALL PAIN, ACUTE 04/16/2010  . KNEE PAIN 02/04/2010  . MENTAL CONFUSION 11/16/2009  . ANXIETY 11/16/2009  . FATIGUE 03/28/2009  . Nausea alone 03/28/2009    Past Surgical History:  Procedure Laterality Date  . CESAREAN SECTION    . ENDOMETRIAL ABLATION    . HERNIA REPAIR      Current Outpatient Rx  . Order #: [id number] Class: Historical Med  . Order #: [id number] Class: Historical Med  . Order #: [id number] Class: Print  . Order #: [id number] Class: Print  . Order #: [id number] Class: Normal  . Order #: [id number] Class: Print  .  Order #: [id number] Class: Print  . Order #: [id number] Class: Print  . Order #: [id number] Class: Print  . Order #: [id number] Class: Print    Allergies Blueberry [vaccinium angustifolium]; Grapeseed extract [nutritional supplements]; Lyrica [pregabalin]; Oxycodone; Topiramate; and Hydrocodone-acetaminophen  Family History  Problem Relation Age of Onset  . Diabetes Mother   . Heart disease Mother   . Kidney disease Mother   . Hypertension Mother   . Stroke Mother   . Diabetes Other   . Hypertension Other   . Kidney disease Other     Social History Social History  Substance Use Topics  . Smoking status: Never Smoker  . Smokeless tobacco: Never Used  . Alcohol use No    Review of Systems  10-point ROS otherwise negative.  ____________________________________________   PHYSICAL EXAM:  VITAL SIGNS: ED Triage Vitals  Enc Vitals Group     BP 07/22/16 1335 (!) 184/105     Pulse Rate 07/22/16 1335 90     Resp 07/22/16 1335 18     Temp 07/22/16 1335 98.1 F (36.7 C)     Temp Source 07/22/16 1335 Oral     SpO2 07/22/16 1327 100 %     Weight 07/22/16 1336 127 lb (57.6 kg)     Height 07/22/16 1336 5\' 2"  (1.575 m)     Pain Score 07/22/16 1336 10   Constitutional: Alert and oriented. Patient appears very uncomfortable.  Eyes: Conjunctivae are normal. Head: Atraumatic. Nose: No congestion/rhinnorhea. Mouth/Throat: Mucous  membranes are moist.   Neck: No stridor.  Mild cervical spine tenderness to palpation.  Cardiovascular: Normal rate, regular rhythm. Good peripheral circulation. Grossly normal heart sounds.   Respiratory: Normal respiratory effort.  No retractions. Lungs CTAB. Gastrointestinal: Soft and nontender. No distention.  Musculoskeletal: No lower extremity tenderness nor edema. Left shoulder deformity with palpable humeral head anterior to the glenoid.  Neurologic:  Normal speech and language. No gross focal neurologic deficits are appreciated.  Skin:  Skin is warm,  dry and intact. No rash noted. Psychiatric: Mood and affect are normal. Speech and behavior are normal.  ____________________________________________  RADIOLOGY  Ct Cervical Spine Wo Contrast  Result Date: 07/22/2016 CLINICAL DATA:  Fall. EXAM: CT CERVICAL SPINE WITHOUT CONTRAST TECHNIQUE: Multidetector CT imaging of the cervical spine was performed without intravenous contrast. Multiplanar CT image reconstructions were also generated. COMPARISON:  None. FINDINGS: Alignment: Normal. Skull base and vertebrae: The vertebral body heights are well preserved. The facet joints appear well-aligned. Soft tissues and spinal canal: No prevertebral fluid or swelling. No visible canal hematoma. Disc levels:  Unremarkable. Upper chest: Negative. Other: None. IMPRESSION: 1. No acute findings.  No evidence for fracture or subluxation. Electronically Signed   By: Signa Kell M.D.   On: 07/22/2016 16:53   Dg Shoulder Left  Result Date: 07/22/2016 CLINICAL DATA:  Post reduction after a fall today EXAM: LEFT SHOULDER - 2+ VIEW COMPARISON:  Pre reduction films from earlier today FINDINGS: The left humeral head dislocation has been reduced. No acute fracture is seen. The left AC joint appears normally aligned. The left ribs that are visualized are intact. IMPRESSION: Reduction of anterior dislocation. Electronically Signed   By: Dwyane Dee M.D.   On: 07/22/2016 16:31   Dg Shoulder Left  Result Date: 07/22/2016 CLINICAL DATA:  Slipped and fell with pain and deformity EXAM: LEFT SHOULDER - 2+ VIEW COMPARISON:  None. FINDINGS: There is anterior, inferior dislocation of the left humeral head with respect to the glenoid. There is minimal irregularity of the inferior glenoid rim. There is irregularity and possible small fracture deformity along the undersurface of the acromion. Left lung apex is clear. Small calcifications along the lateral aspect of the left proximal left humerus. IMPRESSION: 1. Anterior, inferior  dislocation of the left humeral head with respect to the glenoid. 2. Irregularities at the inferior glenoid rim and along the undersurface of the acromion, cannot rule out subtle fracture deformities. Electronically Signed   By: Jasmine Pang M.D.   On: 07/22/2016 14:20    ____________________________________________   PROCEDURES  Procedure(s) performed:   .Sedation Date/Time: 07/22/2016 8:09 PM Performed by: LONG, Arlyss Repress Authorized by: Maia Plan   Consent:    Consent obtained:  Written   Consent given by:  Patient and parent   Risks discussed:  Allergic reaction, prolonged hypoxia resulting in organ damage, prolonged sedation necessitating reversal, respiratory compromise necessitating ventilatory assistance and intubation, vomiting, nausea, inadequate sedation and dysrhythmia   Alternatives discussed:  Regional anesthesia Universal protocol:    Procedure explained and questions answered to patient or proxy's satisfaction: yes     Relevant documents present and verified: yes     Test results available and properly labeled: yes     Imaging studies available: yes     Required blood products, implants, devices, and special equipment available: yes     Site/side marked: yes     Immediately prior to procedure a time out was called: yes     Patient identity  confirmation method:  Verbally with patient and hospital-assigned identification number Indications:    Procedure performed:  Dislocation reduction   Procedure necessitating sedation performed by:  Physician performing sedation   Intended level of sedation:  Moderate (conscious sedation) Pre-sedation assessment:    Neck mobility: normal     Mouth opening:  3 or more finger widths   Mallampati score:  I - soft palate, uvula, fauces, pillars visible   Pre-sedation assessments completed and reviewed: airway patency, cardiovascular function, hydration status and mental status     Pre-sedation assessments completed and reviewed:  nausea/vomiting not reviewed     History of difficult intubation: no     Pre-sedation assessment completed:  07/22/2016 3:52 PM Immediate pre-procedure details:    Reassessment: Patient reassessed immediately prior to procedure     Reviewed: vital signs, relevant labs/tests and NPO status     Verified: bag valve mask available, emergency equipment available, intubation equipment available, IV patency confirmed and oxygen available   Procedure details (see MAR for exact dosages):    Sedation start time:  07/22/2016 3:53 PM   Preoxygenation:  Nasal cannula   Sedation:  Propofol   Intra-procedure monitoring:  Blood pressure monitoring, cardiac monitor, continuous capnometry, continuous pulse oximetry, frequent LOC assessments and frequent vital sign checks   Intra-procedure events: none     Intra-procedure management:  Supplemental oxygen   Sedation end time:  07/22/2016 4:01 PM   Total sedation time (minutes):  8 Post-procedure details:    Attendance: Constant attendance by certified staff until patient recovered     Recovery: Patient returned to pre-procedure baseline     Complications:  None   Patient tolerance:  Tolerated well, no immediate complications Reduction of dislocation Date/Time: 07/22/2016 8:13 PM Performed by: LONG, JOSHUA G Authorized by: Maia Plan  Consent: Written consent obtained. Risks and benefits: risks, benefits and alternatives were discussed Consent given by: patient and parent Patient understanding: patient states understanding of the procedure being performed Patient consent: the patient's understanding of the procedure matches consent given Procedure consent: procedure consent matches procedure scheduled Relevant documents: relevant documents present and verified Test results: test results available and properly labeled Site marked: the operative site was marked Imaging studies: imaging studies available Required items: required blood products, implants,  devices, and special equipment available Patient identity confirmed: verbally with patient, arm band and hospital-assigned identification number Time out: Immediately prior to procedure a "time out" was called to verify the correct patient, procedure, equipment, support staff and site/side marked as required. Local anesthesia used: yes Anesthesia: hematoma block  Anesthesia: Local anesthesia used: yes Local Anesthetic: bupivacaine 0.25% without epinephrine Anesthetic total: 7 mL  Sedation: Patient sedated: yes Sedatives: propofol Sedation start date/time: 07/22/2016 3:43 PM Sedation end date/time: 07/22/2016 4:01 PM Vitals: Vital signs were monitored during sedation. Patient tolerance: Patient tolerated the procedure well with no immediate complications Comments: Left shoulder was reduced easily with adequate relaxation and gentle traction and slow external rotation. There was a palpable sensation of the shoulder re-locating. The humerus was palpated now in the glenoid fossa. The left arm/shoulder was immobilized in a sling.     ____________________________________________   INITIAL IMPRESSION / ASSESSMENT AND PLAN / ED COURSE  Pertinent labs & imaging results that were available during my care of the patient were reviewed by me and considered in my medical decision making (see chart for details).  Patient presents to the ED for evaluation of left shoulder pain and deformity concerning for anterior dislocation.  This was confirmed by x-ray. The patient was placed in c-collar with some mild cervical spine tenderness and concern for distracting injury. Normal ROM of the left hip. The dislocation was reduced as above without complication. CT c-spine with no injury. Collar removed with no tenderness to palpation on re-examination.    Patient discharged with return precautions and orthopedic follow up.   At this time, I do not feel there is any life-threatening condition present. I have  reviewed and discussed all results (EKG, imaging, lab, urine as appropriate), exam findings with patient. I have reviewed nursing notes and appropriate previous records.  I feel the patient is safe to be discharged home without further emergent workup. Discussed usual and customary return precautions. Patient and family (if present) verbalize understanding and are comfortable with this plan.  Patient will follow-up with their primary care provider. If they do not have a primary care provider, information for follow-up has been provided to them. All questions have been answered.   ____________________________________________  FINAL CLINICAL IMPRESSION(S) / ED DIAGNOSES  Final diagnoses:  Fall, initial encounter  Strain of neck muscle, initial encounter  Anterior dislocation of left shoulder, initial encounter     MEDICATIONS GIVEN DURING THIS VISIT:  Medications  fentaNYL (SUBLIMAZE) injection 50 mcg (not administered)  fentaNYL (SUBLIMAZE) 100 MCG/2ML injection (100 mcg  Given 07/22/16 1335)  morphine 4 MG/ML injection 4 mg (4 mg Intravenous Given 07/22/16 1405)  morphine 4 MG/ML injection 4 mg (4 mg Intravenous Given 07/22/16 1444)  bupivacaine (MARCAINE) 0.5 % injection 20 mL (20 mLs Infiltration Given 07/22/16 1443)  propofol (DIPRIVAN) 10 mg/mL bolus/IV push 57.6 mg (60 mg Intravenous Given 07/22/16 1555)     NEW OUTPATIENT MEDICATIONS STARTED DURING THIS VISIT:  None   Note:  This document was prepared using Dragon voice recognition software and may include unintentional dictation errors.  Alona BeneJoshua Long, MD Emergency Medicine   Maia PlanJoshua G Long, MD 07/22/16 2020

## 2016-07-22 NOTE — ED Notes (Signed)
This RN witnessed 50 mcg of fentanyl wasted in sharps container by Bryon LionsNicole Simmons, RN.

## 2016-07-22 NOTE — ED Notes (Signed)
Patient transported to X-ray 

## 2016-07-22 NOTE — ED Notes (Signed)
Pt to xray

## 2016-07-22 NOTE — ED Triage Notes (Signed)
Pt arrives from home via [facility] reporting s/p fall with L shoulder pain and some L hip pain.  EMS reports minor deformity to L shoulder, sling in place.  Pulses and neuro intact.  EMS reports giving 50 mcg Fentanyl x 2, pt reports pain went from "12" to 9, now back to 10/10.  AOx4.

## 2016-07-22 NOTE — Progress Notes (Signed)
Orthopedic Tech Progress Note Patient Details:  Bailey Patterson [birth year] [id number]  Ortho Devices Type of Ortho Device: Sling immobilizer Ortho Device/Splint Location: lue Ortho Device/Splint Interventions: Application   [name] 07/22/2016, 4:04 PM As ordered by Dr. Jacqulyn BathLONG

## 2016-07-22 NOTE — Discharge Instructions (Signed)
You were seen in the ED today with a fall and shoulder dislocation. Keep your arm in the sling for the next 24-36 hours and then you can remove it. Call your PCP to schedule a follow up appointment. If you continue to have pain in the shoulder or weakness/numbness you should call the orthopedist listed below.   Return to the ED if your should becomes dislocated again or you have any additional injuries.

## 2016-07-24 ENCOUNTER — Ambulatory Visit (INDEPENDENT_AMBULATORY_CARE_PROVIDER_SITE_OTHER): Payer: Medicaid Other | Admitting: Family

## 2016-08-06 ENCOUNTER — Ambulatory Visit (INDEPENDENT_AMBULATORY_CARE_PROVIDER_SITE_OTHER): Payer: Medicaid Other | Admitting: Family

## 2016-08-06 ENCOUNTER — Ambulatory Visit (INDEPENDENT_AMBULATORY_CARE_PROVIDER_SITE_OTHER): Payer: Medicaid Other

## 2016-08-06 DIAGNOSIS — S4292XA Fracture of left shoulder girdle, part unspecified, initial encounter for closed fracture: Secondary | ICD-10-CM | POA: Diagnosis not present

## 2016-08-06 MED ORDER — NAPROXEN 500 MG PO TABS
500.0000 mg | ORAL_TABLET | Freq: Two times a day (BID) | ORAL | 0 refills | Status: DC | PRN
Start: 2016-08-06 — End: 2016-08-06

## 2016-08-06 MED ORDER — TIZANIDINE HCL 4 MG PO TABS
4.0000 mg | ORAL_TABLET | Freq: Three times a day (TID) | ORAL | 0 refills | Status: DC | PRN
Start: 2016-08-06 — End: 2019-04-21

## 2016-08-06 MED ORDER — NAPROXEN 500 MG PO TABS: 500.0000 mg | ORAL_TABLET | Freq: Two times a day (BID) | ORAL | 0 refills | Status: DC | PRN

## 2016-08-06 NOTE — Progress Notes (Signed)
Office Visit Note   Patient: Bailey Patterson           Date of Birth: 1961-02-19           MRN: [id number] Visit Date: 08/06/2016              Requested by: Fleet Contras, MD [address] [place], [place] [address] PCP: Dorrene German, MD  No chief complaint on file.     HPI: The patient is a 56 year old woman who presents today 2 weeks status post anterior inferior dislocation of her left shoulder. States was playing with her son pulling on some he wonders dislocated. Has been wearing her sling off and on. States she did have to stop wearing it about 4 days out. Does have a with her today. Minimal pain in the shoulder. He does have some neck pain and tenderness swelling noted as well.  Assessment & Plan: Visit Diagnoses:  1. Traumatic closed displaced fracture of left shoulder with anterior dislocation, initial encounter     Plan: The patient was encouraged to continue wearing her sling for 2 more weeks. May use heat for her muscle pain.  Follow-Up Instructions: Return in about 3 weeks (around 08/27/2016).   Physical Exam  Constitutional: Appears well-developed.  Head: Normocephalic.  Eyes: EOM are normal.  Neck: Normal range of motion.  Cardiovascular: Normal rate.   Pulmonary/Chest: Effort normal.  Neurological: Is alert.  Skin: Skin is warm.  Psychiatric: Has a normal mood and affect.  Left Shoulder Exam   Tenderness  The patient is experiencing no tenderness.     Other  Erythema: absent   Comments:  Some tenderness and swelling in trapezius on left. No palpable spasm       Imaging: Xr Shoulder Left  Result Date: 08/06/2016 Radiographs of the left shoulder are negative for fracture. The humeral head is well aligned in the glenoid.   Labs: Lab Results  Component Value Date   ESRSEDRATE 14 12/20/2010   ESRSEDRATE 13 03/28/2009   REPTSTATUS 11/08/2013 FINAL 11/06/2013   CULT NO GROWTH Performed at [facility] 11/06/2013    Orders:    Orders Placed This Encounter  Procedures  . XR Shoulder Left   Meds ordered this encounter  Medications  . DISCONTD: naproxen (NAPROSYN) 500 MG tablet    Sig: Take 1 tablet (500 mg total) by mouth 2 (two) times daily as needed for mild pain, moderate pain or headache (TAKE WITH MEALS.).    Dispense:  20 tablet    Refill:  0  . tiZANidine (ZANAFLEX) 4 MG tablet    Sig: Take 1 tablet (4 mg total) by mouth every 8 (eight) hours as needed for muscle spasms.    Dispense:  30 tablet    Refill:  0  . naproxen (NAPROSYN) 500 MG tablet    Sig: Take 1 tablet (500 mg total) by mouth 2 (two) times daily as needed for mild pain, moderate pain or headache (TAKE WITH MEALS.).    Dispense:  60 tablet    Refill:  0     Procedures: No procedures performed  Clinical Data: No additional findings.  ROS:  Review of Systems  Constitutional: Negative for chills and fever.  Musculoskeletal: Positive for arthralgias and myalgias.    Objective: Vital Signs: There were no vitals taken for this visit.  Specialty Comments:  No specialty comments available.  PMFS History: Patient Active Problem List   Diagnosis Date Noted  . Hematochezia 08/11/2012  . Rectal  pain, chronic 08/11/2012  . Unspecified constipation 08/11/2012  . Migraine headache 12/20/2010  . Paresthesia 12/20/2010  . CHEST WALL PAIN, ACUTE 04/16/2010  . KNEE PAIN 02/04/2010  . MENTAL CONFUSION 11/16/2009  . ANXIETY 11/16/2009  . FATIGUE 03/28/2009  . Nausea alone 03/28/2009   Past Medical History:  Diagnosis Date  . ANXIETY 11/16/2009  . CHEST WALL PAIN, ACUTE 04/16/2010  . Constipation   . DVT (deep venous thrombosis) (HCC)   . FATIGUE 03/28/2009  . Fibromyalgia   . Hernia   . KNEE PAIN 02/04/2010  . MENTAL CONFUSION 11/16/2009  . MIGRAINE, COMMON W/INTRACTABLE MIGRAINE 03/28/2009  . Nausea alone 03/28/2009  . Phlebitis, complicating pregnancy or puerperium   . Vertigo     Family History  Problem Relation Age of  Onset  . Diabetes Mother   . Heart disease Mother   . Kidney disease Mother   . Hypertension Mother   . Stroke Mother   . Diabetes Other   . Hypertension Other   . Kidney disease Other     Past Surgical History:  Procedure Laterality Date  . CESAREAN SECTION    . ENDOMETRIAL ABLATION    . HERNIA REPAIR     Social History   Occupational History  . Not on file.   Social History Main Topics  . Smoking status: Never Smoker  . Smokeless tobacco: Never Used  . Alcohol use No  . Drug use: No  . Sexual activity: Yes

## 2016-08-27 ENCOUNTER — Encounter (INDEPENDENT_AMBULATORY_CARE_PROVIDER_SITE_OTHER): Payer: Self-pay | Admitting: Family

## 2016-08-27 ENCOUNTER — Ambulatory Visit (INDEPENDENT_AMBULATORY_CARE_PROVIDER_SITE_OTHER): Payer: Medicaid Other | Admitting: Family

## 2016-08-27 VITALS — Ht 62.0 in | Wt 127.0 lb

## 2016-08-27 DIAGNOSIS — M25512 Pain in left shoulder: Secondary | ICD-10-CM

## 2016-08-27 DIAGNOSIS — S46812D Strain of other muscles, fascia and tendons at shoulder and upper arm level, left arm, subsequent encounter: Secondary | ICD-10-CM

## 2016-08-27 MED ORDER — CYCLOBENZAPRINE HCL 10 MG PO TABS: 10.0000 mg | ORAL_TABLET | Freq: Three times a day (TID) | ORAL | 0 refills | Status: DC | PRN

## 2016-08-27 NOTE — Progress Notes (Signed)
Office Visit Note   Patient: Bailey Patterson           Date of Birth: 1960-08-16           MRN: [id number] Visit Date: 08/27/2016              Requested by: Fleet Contras, MD [address] [place], [place] [address] PCP: Dorrene German, MD  Chief Complaint  Patient presents with  . Left Shoulder - Follow-up    DOI around 07/23/16 left shoulder anterior inferior dislocation left shoulder      HPI: The patient is a 56 year old woman who presents today in follow-up following a left shoulder dislocation on March 14. States that she use her sling on and off wonders if she can discontinue using it. Complains of some popping with range of motion of her shoulder. States has not tried lifting her arm above her head. Is worried that her shoulder may dislocate again in the future. States that she isn't taking Zanaflex with moderate relief of spasm pain in her shoulder. Wonders if she can stop taking the naproxen states it causes nausea.  Assessment & Plan: Visit Diagnoses:  1. Acute pain of left shoulder   2. Strain of left trapezius muscle, subsequent encounter     Plan: Will provide one more prescription for muscle relaxer. Follow up in office as needed. May use heat. Return to activities as tolerated.  Follow-Up Instructions: Return if symptoms worsen or fail to improve.   Left Shoulder Exam   Tenderness  Left shoulder tenderness location: trapezius.  Range of Motion  Passive Abduction: normal   Tests  Impingement: negative  Other  Erythema: absent Pulse: present       Patient is alert, oriented, no adenopathy, well-dressed, normal affect, normal respiratory effort.   Imaging: No results found.  Labs: Lab Results  Component Value Date   ESRSEDRATE 14 12/20/2010   ESRSEDRATE 13 03/28/2009   REPTSTATUS 11/08/2013 FINAL 11/06/2013   CULT NO GROWTH Performed at [facility] 11/06/2013    Orders:  No orders of the defined types were placed in this  encounter.  Meds ordered this encounter  Medications  . cyclobenzaprine (FLEXERIL) 10 MG tablet    Sig: Take 1 tablet (10 mg total) by mouth 3 (three) times daily as needed for muscle spasms.    Dispense:  30 tablet    Refill:  0     Procedures: No procedures performed  Clinical Data: No additional findings.  ROS:  All other systems negative, except as noted in the HPI. Review of Systems  Constitutional: Negative for chills and fever.  Musculoskeletal: Positive for myalgias. Negative for arthralgias and joint swelling.    Objective: Vital Signs: Ht  (1.575 m)   Wt 127 lb (57.6 kg)   BMI 23.23 kg/m   Specialty Comments:  No specialty comments available.  PMFS History: Patient Active Problem List   Diagnosis Date Noted  . Hematochezia 08/11/2012  . Rectal pain, chronic 08/11/2012  . Unspecified constipation 08/11/2012  . Migraine headache 12/20/2010  . Paresthesia 12/20/2010  . CHEST WALL PAIN, ACUTE 04/16/2010  . KNEE PAIN 02/04/2010  . MENTAL CONFUSION 11/16/2009  . ANXIETY 11/16/2009  . FATIGUE 03/28/2009  . Nausea alone 03/28/2009   Past Medical History:  Diagnosis Date  . ANXIETY 11/16/2009  . CHEST WALL PAIN, ACUTE 04/16/2010  . Constipation   . DVT (deep venous thrombosis) (HCC)   . FATIGUE 03/28/2009  . Fibromyalgia   .  Hernia   . KNEE PAIN 02/04/2010  . MENTAL CONFUSION 11/16/2009  . MIGRAINE, COMMON W/INTRACTABLE MIGRAINE 03/28/2009  . Nausea alone 03/28/2009  . Phlebitis, complicating pregnancy or puerperium   . Vertigo     Family History  Problem Relation Age of Onset  . Diabetes Mother   . Heart disease Mother   . Kidney disease Mother   . Hypertension Mother   . Stroke Mother   . Diabetes Other   . Hypertension Other   . Kidney disease Other     Past Surgical History:  Procedure Laterality Date  . CESAREAN SECTION    . ENDOMETRIAL ABLATION    . HERNIA REPAIR     Social History   Occupational History  . Not on file.    Social History Main Topics  . Smoking status: Never Smoker  . Smokeless tobacco: Never Used  . Alcohol use No  . Drug use: No  . Sexual activity: Yes

## 2016-10-14 ENCOUNTER — Emergency Department (HOSPITAL_COMMUNITY)
Admission: EM | Admit: 2016-10-14 | Discharge: 2016-10-14 | Disposition: A | Discharge status: Home / Self Care | Payer: Medicaid Other | Attending: Emergency Medicine | Admitting: Emergency Medicine

## 2016-10-14 ENCOUNTER — Encounter (HOSPITAL_COMMUNITY): Payer: Self-pay | Admitting: *Deleted

## 2016-10-14 DIAGNOSIS — E860 Dehydration: Secondary | ICD-10-CM | POA: Diagnosis not present

## 2016-10-14 DIAGNOSIS — R531 Weakness: Secondary | ICD-10-CM | POA: Diagnosis present

## 2016-10-14 DIAGNOSIS — T675XXA Heat exhaustion, unspecified, initial encounter: Secondary | ICD-10-CM

## 2016-10-14 DIAGNOSIS — Z79899 Other long term (current) drug therapy: Secondary | ICD-10-CM | POA: Diagnosis not present

## 2016-10-14 DIAGNOSIS — T673XXA Heat exhaustion, anhydrotic, initial encounter: Secondary | ICD-10-CM | POA: Insufficient documentation

## 2016-10-14 LAB — BASIC METABOLIC PANEL
Anion gap: 8 (ref 5–15)
BUN: 12 mg/dL (ref 6–20)
CO2: 22 mmol/L (ref 22–32)
Calcium: 9.8 mg/dL (ref 8.9–10.3)
Chloride: 109 mmol/L (ref 101–111)
Creatinine, Ser: 0.67 mg/dL (ref 0.44–1.00)
GFR calc Af Amer: >60 mL/min (ref >60)
GFR calc non Af Amer: >60 mL/min (ref >60)
GLUCOSE: 86 mg/dL (ref 65–99)
POTASSIUM: 3.8 mmol/L (ref 3.5–5.1)
Sodium: 139 mmol/L (ref 135–145)

## 2016-10-14 LAB — URINALYSIS, ROUTINE W REFLEX MICROSCOPIC
BILIRUBIN URINE: NEGATIVE
Glucose, UA: NEGATIVE mg/dL
Ketones, ur: NEGATIVE mg/dL
Leukocytes, UA: NEGATIVE
Nitrite: NEGATIVE
Protein, ur: NEGATIVE mg/dL
SPECIFIC GRAVITY, URINE: 1.017 (ref 1.005–1.030)
pH: 6 (ref 5.0–8.0)

## 2016-10-14 LAB — CBC
HEMATOCRIT: 38.9 % (ref 36.0–46.0)
HEMOGLOBIN: 12.7 g/dL (ref 12.0–15.0)
MCH: 28.3 pg (ref 26.0–34.0)
MCHC: 32.6 g/dL (ref 30.0–36.0)
MCV: 86.8 fL (ref 78.0–100.0)
Platelets: 291 10*3/uL (ref 150–400)
RBC: 4.48 MIL/uL (ref 3.87–5.11)
RDW: 13.2 % (ref 11.5–15.5)
WBC: 4.7 10*3/uL (ref 4.0–10.5)

## 2016-10-14 NOTE — Discharge Instructions (Signed)
Please read attached information. If you experience any new or worsening signs or symptoms please return to the emergency room for evaluation. Please follow-up with your primary care provider or specialist as discussed.  °

## 2016-10-14 NOTE — ED Provider Notes (Signed)
[facility]-EMERGENCY DEPT Provider Note   CSN: [id number] Arrival date & time: 10/14/16  1124   By signing my name below, I, Soijett Blue, attest that this documentation has been prepared under the direction and in the presence of Burna FortsJeff Faysal Fenoglio, PA-C Electronically Signed: Soijett Blue, ED Scribe. 10/14/16. 2:40 PM.  History   Chief Complaint Chief Complaint  Patient presents with  . Weakness    HPI  Bailey Patterson is a 56 y.o. female who presents to the Emergency Department complaining of generalized weakness onset PTA. Pt reports associated resolved dizziness, lightheadedness, and resolved palpitations. She notes that she had similar symptoms 5 days ago while at work. Pt has not tried any medications for the relief of her symptoms. She notes that she works in a [employer] completing daily physical activity. Pt reports that she doesn't consume as much water as she should daily. She denies fever, chills, and any other symptoms.   The history is provided by the patient. No language interpreter was used.    Past Medical History:  Diagnosis Date  . ANXIETY 11/16/2009  . CHEST WALL PAIN, ACUTE 04/16/2010  . Constipation   . DVT (deep venous thrombosis) (HCC)   . FATIGUE 03/28/2009  . Fibromyalgia   . Hernia   . KNEE PAIN 02/04/2010  . MENTAL CONFUSION 11/16/2009  . MIGRAINE, COMMON W/INTRACTABLE MIGRAINE 03/28/2009  . Nausea alone 03/28/2009  . Phlebitis, complicating pregnancy or puerperium   . Vertigo     Patient Active Problem List   Diagnosis Date Noted  . Hematochezia 08/11/2012  . Rectal pain, chronic 08/11/2012  . Unspecified constipation 08/11/2012  . Migraine headache 12/20/2010  . Paresthesia 12/20/2010  . CHEST WALL PAIN, ACUTE 04/16/2010  . KNEE PAIN 02/04/2010  . MENTAL CONFUSION 11/16/2009  . ANXIETY 11/16/2009  . FATIGUE 03/28/2009  . Nausea alone 03/28/2009    Past Surgical History:  Procedure Laterality Date  . CESAREAN SECTION    . ENDOMETRIAL  ABLATION    . HERNIA REPAIR      OB History    No data available       Home Medications    Prior to Admission medications   Medication Sig Start Date End Date Taking? Authorizing Provider  Butalbital-APAP-Caffeine 50-300-40 MG CAPS Take 1 capsule by mouth every 6 (six) hours as needed (migraine headache).  06/27/16   [provider]  cyclobenzaprine (FLEXERIL) 10 MG tablet Take 1 tablet (10 mg total) by mouth 3 (three) times daily as needed for muscle spasms. 08/27/16   Adonis HugueninZamora, Erin R, NP  naproxen (NAPROSYN) 500 MG tablet Take 1 tablet (500 mg total) by mouth 2 (two) times daily as needed for mild pain, moderate pain or headache (TAKE WITH MEALS.). 08/06/16   Adonis HugueninZamora, Erin R, NP  tiZANidine (ZANAFLEX) 4 MG tablet Take 1 tablet (4 mg total) by mouth every 8 (eight) hours as needed for muscle spasms. 08/06/16   Adonis HugueninZamora, Erin R, NP    Family History Family History  Problem Relation Age of Onset  . Diabetes Mother   . Heart disease Mother   . Kidney disease Mother   . Hypertension Mother   . Stroke Mother   . Diabetes Other   . Hypertension Other   . Kidney disease Other     Social History Social History  Substance Use Topics  . Smoking status: Never Smoker  . Smokeless tobacco: Never Used  . Alcohol use No     Allergies   Blueberry [vaccinium angustifolium]; Grapeseed  extract [nutritional supplements]; Lyrica [pregabalin]; Oxycodone; Topiramate; and Hydrocodone-acetaminophen   Review of Systems Review of Systems  Constitutional: Negative for chills and fever.       +increased thirst  Cardiovascular: Positive for palpitations (resolved).  Neurological: Positive for dizziness (resolved), weakness (generalized) and light-headedness.  All other systems reviewed and are negative.    Physical Exam Updated Vital Signs BP 139/85   Pulse 75   Temp 98.3 F (36.8 C) (Oral)   Resp 18   SpO2 100%   Physical Exam  Constitutional: She is oriented to person, place,  and time. She appears well-developed and well-nourished. No distress.  HENT:  Head: Normocephalic and atraumatic.  Eyes: EOM are normal.  Neck: Neck supple.  Cardiovascular: Normal rate, regular rhythm and normal heart sounds.  Exam reveals no gallop and no friction rub.   No murmur heard. Pulmonary/Chest: Effort normal and breath sounds normal. No respiratory distress. She has no wheezes. She has no rales.  Abdominal: She exhibits no distension.  Musculoskeletal: Normal range of motion.  Neurological: She is alert and oriented to person, place, and time. She has normal strength. No sensory deficit. Coordination normal.  Skin: Skin is warm and dry.  Psychiatric: She has a normal mood and affect. Her behavior is normal.  Nursing note and vitals reviewed.    ED Treatments / Results  DIAGNOSTIC STUDIES: Oxygen Saturation is 100% on RA, nl by my interpretation.    COORDINATION OF CARE: 2:38 PM Discussed treatment plan with pt at bedside which includes UA, labs, EKG, and pt agreed to plan.   Labs (all labs ordered are listed, but only abnormal results are displayed) Labs Reviewed  URINALYSIS, ROUTINE W REFLEX MICROSCOPIC - Abnormal; Notable for the following:       Result Value   Hgb urine dipstick SMALL (*)    Bacteria, UA RARE (*)    Squamous Epithelial / LPF 0-5 (*)    All other components within normal limits  BASIC METABOLIC PANEL  CBC    EKG  EKG Interpretation None       Radiology No results found.  Procedures Procedures (including critical care time)  Medications Ordered in ED Medications - No data to display   Initial Impression / Assessment and Plan / ED Course  I have reviewed the triage vital signs and the nursing notes.  Pertinent labs & imaging results that were available during my care of the patient were reviewed by me and considered in my medical decision making (see chart for details).     Labs: BMP, CBC,  urinalysis  Imaging:  Consults:  Therapeutics:  Discharge Meds:   Assessment/Plan: 56 year old female presents today with generalized weakness. She is very well-appearing, no vital sign instabilities, reassuring laboratory analysis. Patient works outside, has not had any water today. This is likely heat exhaustion secondary dehydration. Patient instructed to drink plenty of water, avoid long episodes of exposure, follow-up immediately if any new or worsening signs or symptoms present. Patient verbalized understanding and agreement to today's plan had no further questions or concerns. Discharge   Final Clinical Impressions(s) / ED Diagnoses   Final diagnoses:  Generalized weakness  Heat exhaustion, initial encounter    New Prescriptions Discharge Medication List as of 10/14/2016  3:18 PM     I personally performed the services described in this documentation, which was scribed in my presence. The recorded information has been reviewed and is accurate.     Eyvonne Mechanic, PA-C 10/14/16 1610  Margarita Grizzle, MD 10/19/16 [phone]

## 2016-10-14 NOTE — ED Notes (Signed)
Pt is in stable condition upon d/c and ambulates from ED. 

## 2016-10-14 NOTE — ED Triage Notes (Signed)
To ED for eval after feeling weak and very thirsty while at work. Denies cp or sob. Appears in nad

## 2016-12-16 ENCOUNTER — Other Ambulatory Visit: Payer: Self-pay | Admitting: Internal Medicine

## 2016-12-16 DIAGNOSIS — E2839 Other primary ovarian failure: Secondary | ICD-10-CM

## 2017-07-16 ENCOUNTER — Encounter: Payer: Self-pay | Admitting: Neurology

## 2017-07-16 ENCOUNTER — Encounter: Payer: Self-pay | Admitting: *Deleted

## 2017-07-16 ENCOUNTER — Ambulatory Visit: Payer: Self-pay | Admitting: Neurology

## 2017-07-16 VITALS — BP 109/71 | HR 99 | Ht 62.5 in | Wt 122.0 lb

## 2017-07-16 DIAGNOSIS — R51 Headache with orthostatic component, not elsewhere classified: Secondary | ICD-10-CM

## 2017-07-16 DIAGNOSIS — H539 Unspecified visual disturbance: Secondary | ICD-10-CM

## 2017-07-16 DIAGNOSIS — G43711 Chronic migraine without aura, intractable, with status migrainosus: Secondary | ICD-10-CM

## 2017-07-16 DIAGNOSIS — G8929 Other chronic pain: Secondary | ICD-10-CM

## 2017-07-16 DIAGNOSIS — R519 Headache, unspecified: Secondary | ICD-10-CM

## 2017-07-16 NOTE — Progress Notes (Addendum)
[id number]    Provider:  Dr Jaynee Eagles Referring Provider: Nolene Ebbs, MD Primary Care Physician:  Nolene Ebbs, MD  CC:  [email or web address]  HPI:  Bailey Patterson is a 57 y.o. female here as a referral from Dr. Jeanie Cooks for migraine.  Past medical history fibromyalgia and polyarthralgias, Deep vein thrombosis during pregnancy, migraine headaches, chronic pain syndrome, fibromyalgia, hyperlipidemia, depression. She started having migraines as a child, no FHx of migraines. Been going on "forever". In 1997 she had acupuncture, she [facility], she has tried multiple medications. She has daily headaches, she does not take tylenol or ibuprofen,  Her headaches can get severe. Wakes up with the headache. Her migraibes include dizziness, light and sound sensitivity, starts on the left but can spread, pounding, throbbing, nausea, no vomiting. Can last all day and be severe. Triggers are stress, laying in a dark room helps. She has at least 10 migraine days a month. She has vision changes and positional headaches. No medication overuse. No other focal neurologic deficits, associated symptoms, inciting events or modifiable factors.  Medications tried: Flexeril, diclofenac twice daily, Fioricet,tizaniidine,reglan,zomig, zonegran, imitrex, topiramata, lyrica, gabapentin, propranolol, amitriptyline, nortriptyline.   Reviewed notes, labs and imaging from outside physicians, which showed:  Reviewed referring physician notes.  Patient is being seen at [facility] for fibromyalgia and polyarthralgias.  Patient reported multiple pain symptoms, left shoulder, right shoulder, low back, hands and feet, morning stiffness, "a lot of inflammation", arthralgias improved with use worsened by laying down, thighs feel very hot, she notices swelling in the joints, history of DVT was in the setting of pregnancy without miscarriage or subsequent clots and some dry mouth, some fingertip pallor  but this is not triggered by: Does not sound consistent with Raynaud's.  She follows with her PCP for medication management for fibromyalgia and gets benefit from water exercise.  Per notes, she had extensive workup including TSH, RF, CCP, ESR, CRP and SSA SSB.  CT head 2014 showed No acute intracranial abnormalities including mass lesion or mass effect, hydrocephalus, extra-axial fluid collection, midline shift, hemorrhage, or acute infarction, large ischemic events (personally reviewed images)  CBC, BMP normal 10/2016  March 2018 B12 625, very low vitamin D 19, LDL 144, otherwise unremarkable.  Review of Systems: Patient complains of symptoms per HPI as well as the following symptoms: headache. Pertinent negatives and positives per HPI. All others negative.   Social History   Socioeconomic History  . Marital status: Divorced    Spouse name: Not on file  . Number of children: 5  . Years of education: Not on file  . Highest education level: Some college, no degree  Social Needs  . Financial resource strain: Not on file  . Food insecurity - worry: Not on file  . Food insecurity - inability: Not on file  . Transportation needs - medical: Not on file  . Transportation needs - non-medical: Not on file  Occupational History  . Not on file  Tobacco Use  . Smoking status: Never Smoker  . Smokeless tobacco: Never Used  Substance and Sexual Activity  . Alcohol use: No  . Drug use: No  . Sexual activity: Yes  Other Topics Concern  . Not on file  Social History Narrative   Lives at home alone   Right handed   Drinks a cup of caffeine once a week    Family History  Problem Relation Age of Onset  . Diabetes Mother   . Heart disease  Mother   . Kidney disease Mother   . Hypertension Mother   . Stroke Mother   . Cervical cancer Mother   . Diabetes Other   . Hypertension Other   . Kidney disease Other   . Cancer Father     Past Medical History:  Diagnosis Date  . ANXIETY  11/16/2009  . CHEST WALL PAIN, ACUTE 04/16/2010  . Constipation   . DVT (deep venous thrombosis) ([place])   . FATIGUE 03/28/2009  . Fibromyalgia   . Hernia   . KNEE PAIN 02/04/2010  . MENTAL CONFUSION 11/16/2009  . MIGRAINE, COMMON W/INTRACTABLE MIGRAINE 03/28/2009  . Nausea alone 03/28/2009  . Phlebitis, complicating pregnancy or puerperium   . Presence of IVC filter   . Vertigo     Past Surgical History:  Procedure Laterality Date  . CESAREAN SECTION    . ENDOMETRIAL ABLATION    . HERNIA REPAIR      Current Outpatient Medications  Medication Sig Dispense Refill  . Albuterol Sulfate 108 (90 Base) MCG/ACT AEPB Inhale 2 puffs into the lungs 4 (four) times daily as needed.    . Butalbital-APAP-Caffeine 50-300-40 MG CAPS Take 1 capsule by mouth every 6 (six) hours as needed (migraine headache).   2  . cyclobenzaprine (FLEXERIL) 10 MG tablet Take 1 tablet (10 mg total) by mouth 3 (three) times daily as needed for muscle spasms. 30 tablet 0  . tiZANidine (ZANAFLEX) 4 MG tablet Take 1 tablet (4 mg total) by mouth every 8 (eight) hours as needed for muscle spasms. 30 tablet 0  . Vitamin D, Ergocalciferol, (DRISDOL) 50000 units CAPS capsule Take 1 capsule by mouth once a week.  5   No current facility-administered medications for this visit.     Allergies as of 07/16/2017 - Review Complete 07/16/2017  Allergen Reaction Noted  . Blueberry [vaccinium angustifolium] Anaphylaxis 07/13/2013  . Grapeseed extract [nutritional supplements] Anaphylaxis 07/13/2013  . Lyrica [pregabalin] Swelling 04/09/2016  . Oxycodone Itching and Nausea And Vomiting 01/04/2014  . Topiramate  11/16/2009  . Hydrocodone-acetaminophen Rash and Hives 05/29/2011    Vitals: BP 109/71 (BP Location: Left Arm, Patient Position: Sitting)   Pulse 99   Ht 5' 2.5" (1.588 m)   Wt 122 lb (55.3 kg)   BMI 21.96 kg/m  Last Weight:  Wt Readings from Last 1 Encounters:  07/16/17 122 lb (55.3 kg)   Last Height:   Ht Readings  from Last 1 Encounters:  07/16/17 5' 2.5" (1.588 m)     Physical exam: Exam: Gen: NAD, conversant, well nourised,  well groomed                     CV: RRR, no MRG. No Carotid Bruits. No peripheral edema, warm, nontender Eyes: Conjunctivae clear without exudates or hemorrhage  Neuro: Detailed Neurologic Exam  Speech:    Speech is normal; fluent and spontaneous with normal comprehension.  Cognition:    The patient is oriented to person, place, and time;     recent and remote memory intact;     language fluent;     normal attention, concentration,     fund of knowledge Cranial Nerves:    The pupils are equal, round, and reactive to light. The fundi are normal and spontaneous venous pulsations are present. Visual fields are full to finger confrontation. Extraocular movements are intact. Trigeminal sensation is intact and the muscles of mastication are normal. The face is symmetric. The palate elevates in the midline. Hearing  intact. Voice is normal. Shoulder shrug is normal. The tongue has normal motion without fasciculations.   Coordination:    Normal finger to nose and heel to shin. Normal rapid alternating movements.   Gait:    Heel-toe and tandem gait are normal.   Motor Observation:    No asymmetry, no atrophy, and no involuntary movements noted. Tone:    Normal muscle tone.    Posture:    Posture is normal. normal erect    Strength:    Strength is V/V in the upper and lower limbs.      Sensation: intact to LT     Reflex Exam:  DTR's:    Deep tendon reflexes in the upper and lower extremities are normal bilaterally.   Toes:    The toes are downgoing bilaterally.   Clonus:    Clonus is absent.     Assessment/Plan:  57 year old with chronic intractable daily headache.. Chronic migraine. She has failed multiple classes of medications and had an adequate trial of each one (>3-6 months each).  She had side effects to Topiramate and Amitriptyline. No medication  overuse. Dialy headaches, >10 migraine days a month, lasts up to 24 hours, moderate to severe in pain and she has to miss work or go to the ED.   Medications tried: Flexeril, diclofenac twice daily, Fioricet,tizaniidine,reglan,zomig, zonegran, imitrex, topiramata, lyrica, gabapentin, propranolol, amitriptyline, nortriptyline.   MRI brain w/wo contrast due to positional headaches and vision changes to evaluate for space-occupying masses, pseudotumor cerebri, chiari or other intracranial etiologies She would be an excellent candidate for Botox for migraine, will request Discussed CGRP medications in the future.  Discused: To prevent or relieve headaches, try the following: Cool Compress. Lie down and place a cool compress on your head.  Avoid headache triggers. If certain foods or odors seem to have triggered your migraines in the past, avoid them. A headache diary might help you identify triggers.  Include physical activity in your daily routine. Try a daily walk or other moderate aerobic exercise.  Manage stress. Find healthy ways to cope with the stressors, such as delegating tasks on your to-do list.  Practice relaxation techniques. Try deep breathing, yoga, massage and visualization.  Eat regularly. Eating regularly scheduled meals and maintaining a healthy diet might help prevent headaches. Also, drink plenty of fluids.  Follow a regular sleep schedule. Sleep deprivation might contribute to headaches Consider biofeedback. With this mind-body technique, you learn to control certain bodily functions - such as muscle tension, heart rate and blood pressure - to prevent headaches or reduce headache pain.    Proceed to emergency room if you experience new or worsening symptoms or symptoms do not resolve, if you have new neurologic symptoms or if headache is severe, or for any concerning symptom.   Provided education and documentation from American headache Society toolbox including articles on:  chronic migraine medication overuse headache, chronic migraines, prevention of migraines, behavioral and other nonpharmacologic treatments for headache.  Orders Placed This Encounter  Procedures  . MR BRAIN W WO CONTRAST  . Basic Metabolic Panel   Cc: Avbuere  Sarina Ill, MD  [facility] [address] [place], [place] [address]  Phone [phone] Fax [phone]

## 2017-07-16 NOTE — Patient Instructions (Addendum)
MRI brain w/wo contrast Start Botox Please return to the office to start Ajovy  Brand Names: [place]  Ajovy  Pharmacologic Category  Calcitonin Gene-Related Peptide (CGRP) Receptor Antagonist;  Monoclonal Antibody, Calcitonin Gene-Related Peptide (CGRP) Receptor Antagonist  Dosing: Adult Migraine prophylaxis: SubQ: 225 mg monthly or 675 mg every 3 months. When switching dosage options, administer the first dose of the new regimen on the next scheduled date of administration. Dosing: Renal Impairment: Adult There are no dosage adjustments provided in the manufacturer's labeling. Renal impairment is not expected to affect the pharmacokinetics of fremanezumab-vfrm. Dosing: Hepatic Impairment: Adult There are no dosage adjustments provided in the manufacturer's labeling. Hepatic impairment is not expected to affect the pharmacokinetics of fremanezumab-vfrm. Dosing: Geriatric Refer to adult dosing. Dosage Forms Excipient information presented when available (limited, particularly for generics); consult specific product labeling. Solution Prefilled Syringe, Subcutaneous [preservative free]:  Ajovy: 225 mg/1.5 mL (1.5 mL) [contains edetate disodium dihydrate, polysorbate 80] Generic Equivalent Available ([place]) No Administration SubQ: For subcutaneous use only. Keep out of direct sunlight and allow prefilled syringe to come to room temperature for 30 minutes before administration. Do not warm by using a heat source (eg, hot water, microwave). Do not shake. Administer in the abdomen, thigh, or upper arm, avoiding areas that are tender, bruised, red, or indurated. The 675 mg dose should be administered as 3 consecutive 225 mg injections. For multiple injections, use the same body site, but not the exact location of the previous injection. Use Migraine prophylaxis: Preventive treatment of migraine in adults Adverse Reactions >10%: Local: Injection site reaction (43% to 45%) 1% to 10%: Immunologic:  Antibody development (?2%; neutralizing <1%) Frequency not defined: Hypersensitivity: Hypersensitivity reaction Contraindications Serious hypersensitivity to fremanezumab-vfrm or any component of the formulation Warnings/Precautions Concerns related to adverse effects: Marland Kitchen Hypersensitivity: Hypersensitivity reactions, including rash, pruritus, drug hypersensitivity, and urticaria, have been reported. Most reactions were mild to moderate and were reported from within hours to 1 month after administration. If a hypersensitivity reaction occurs, consider discontinuing treatment and institute appropriate therapy. Disease-related concerns: . Cardiovascular disease: Patients with a history of significant cardiovascular disease, vascular ischemia, or thrombotic events, such as cerebrovascular accident, transient ischemic attacks, deep vein thrombosis, or pulmonary embolism were excluded from clinical trials; use with caution in these patients. Dosage form specific issues:  . Polysorbate 80: Some dosage forms may contain polysorbate 80 (also known as Tweens). Hypersensitivity reactions, usually a delayed reaction, have been reported following exposure to pharmaceutical products containing polysorbate 80 in certain individuals Maureen Ralphs 2002; Lucente 2000; Vance Gather 1995). Thrombocytopenia, ascites, pulmonary deterioration, and renal and hepatic failure have been reported in premature neonates after receiving parenteral products containing polysorbate 80 (Alade 1986; CDC 1984). See manufacturer's labeling. Other warnings/precautions:  . Immunogenicity: Anti-fremanezumab-vfrm antibodies and neutralizing antibodies may develop. Metabolism/Transport Effects None known. Drug Interactions   (For additional information: Launch drug interactions program)   There are no known significant interactions. Pregnancy Implications Adverse events were not observed in animal reproduction studies. Fremanezumab-vfrm is a  monoclonal antibody; consider the long half-life prior to use in females who are or may become pregnant until information related to pregnancy is available (Tepper 2018). Breast-Feeding Considerations It is not known if fremanezumab-vfrm is present in breast milk. According to the manufacturer, the decision to breastfeed during therapy should consider the risk of infant exposure, the benefits of breastfeeding to the infant, and benefits of treatment to the mother. Monitoring Parameters Number of monthly migraine days. Mechanism of Action Fremanezumab-vfrm is a human  monoclonal antibody that antagonizes calcitonin gene-related peptide (CGRP) receptor function. Pharmacodynamics/Kinetics Distribution: Vd: ~6 L Metabolism: Degraded by enzymatic proteolysis into small peptides and amino acids. Half-life elimination: ~31 days Time to peak: 5 to 7 days Excretion: Clearance: 0.141 L/day Pricing: US Solution Prefilled Syringe (Ajovy Subcutaneous) 225 mg/1.5 mL (per mL): $460.00 Disclaimer: A representative AWP (Average Wholesale Price) price or price range is provided as reference price only. A range is provided when more than one manufacturer's AWP price is available and uses the low and high price reported by the manufacturers to determine the range. The pricing data should be used for benchmarking purposes only, and as such should not be used alone to set or adjudicate any prices for reimbursement or purchasing functions or considered to be an exact price for a single product and/or manufacturer. [employer] expressly disclaims all warranties of any kind or nature, whether express or implied, and assumes no liability with respect to accuracy of price or price range data published in its solutions. In no event shall [employer] be liable for special, indirect, incidental, or consequential damages arising from use of price or price range data. Pricing data is updated monthly.

## 2017-07-17 LAB — BASIC METABOLIC PANEL
BUN / CREAT RATIO: 24 — AB (ref 9–23)
BUN: 16 mg/dL (ref 6–24)
CHLORIDE: 106 mmol/L (ref 96–106)
CO2: 24 mmol/L (ref 20–29)
Calcium: 10 mg/dL (ref 8.7–10.2)
Creatinine, Ser: 0.66 mg/dL (ref 0.57–1.00)
GFR, EST AFRICAN AMERICAN: 115 mL/min/{1.73_m2} (ref >59)
GFR, EST NON AFRICAN AMERICAN: 100 mL/min/{1.73_m2} (ref >59)
Glucose: 102 mg/dL — ABNORMAL HIGH (ref 65–99)
POTASSIUM: 5 mmol/L (ref 3.5–5.2)
SODIUM: 144 mmol/L (ref 134–144)

## 2017-07-20 ENCOUNTER — Telehealth: Payer: Self-pay | Admitting: Neurology

## 2017-07-20 NOTE — Telephone Encounter (Signed)
[facility]  Ask name: Bailey PantherCynthia Groot Member I.D. [id number]  Rx Vin # "pt unaware on number" stating she wasn't giving a rx vin. Group # [id number] Stating that we can call to verify the rest at [phone].  Pt also requesting a call back once everything is in order to discuss amovig

## 2017-07-20 NOTE — Telephone Encounter (Signed)
Called patient. Advised labs unremarkable per AA,MD note. Showed she was dehydrated. Recommended she increase daily fluid intake.   I tried to review insurance information given earlier. Patient has insurance through [employer]. She states [facility] having a hard time running insurance through for prescriptions. She is going to call HR department/insurance to see what her pharmacy benefit plan is. She will call back to let us know this information. Advised we need her pharmacy benefit information for rx aimovig. We will have to do a PA through her insurance.   She also wanted to let Dr. Lucia GaskinsAhern know she went to Dr. Lacretia NicksW at [facility] and diagnosed her with cataracts, bilaterally. They are going to monitor this for now.  She would like her insurance information forwarded to Irving Burtonmily (Engineer, building servicesMRI coordinator) to work on Quarry managerauth for MRI.

## 2017-07-23 NOTE — Telephone Encounter (Signed)
I called her insurance company and spoke to Montverde[place] to check to see the authorization is require. It was not the Ref # is [id number]. Order sent to GI they will contact the patient to schedule.

## 2017-07-30 ENCOUNTER — Ambulatory Visit
Admission: RE | Admit: 2017-07-30 | Discharge: 2017-07-30 | Disposition: A | Discharge status: Home / Self Care | Payer: Self-pay | Source: Ambulatory Visit | Attending: Neurology | Admitting: Neurology

## 2017-07-30 DIAGNOSIS — H539 Unspecified visual disturbance: Secondary | ICD-10-CM

## 2017-07-30 DIAGNOSIS — R51 Headache with orthostatic component, not elsewhere classified: Secondary | ICD-10-CM

## 2017-07-30 DIAGNOSIS — R519 Headache, unspecified: Secondary | ICD-10-CM

## 2017-07-30 MED ORDER — GADOBENATE DIMEGLUMINE 529 MG/ML IV SOLN
10.0000 mL | Freq: Once | INTRAVENOUS | Status: AC | PRN
  Administered 2017-07-30: 10 mL via INTRAVENOUS

## 2017-07-31 ENCOUNTER — Telehealth: Payer: Self-pay | Admitting: Neurology

## 2017-07-31 NOTE — Telephone Encounter (Signed)
-----   Message from Anson FretAntonia B Ahern, MD sent at 07/31/2017  9:57 AM EDT ----- MRI brain unremarkable

## 2017-07-31 NOTE — Telephone Encounter (Signed)
Called pt to review the MRI results. No answer. LVM with the normal results and instructed the pt call if she has questions.

## 2017-08-04 NOTE — Telephone Encounter (Signed)
I called to make the patient aware but she did not answer so I left a VM asking her to call me back.

## 2017-08-04 NOTE — Telephone Encounter (Signed)
I called the patients insurance to check benefits and coverage for her botox injections. She has a limited medical plan that offers no coverage on botox injections. NWG#[id number]#[id number].

## 2017-08-05 NOTE — Telephone Encounter (Signed)
I called the patient to make her aware but she did not answer so I left a VM asking her to call me back.

## 2017-08-06 NOTE — Telephone Encounter (Signed)
Called patient and LVM asking for call back.   Per previous documentation, looks like Dr. Lucia GaskinsAhern has suggested Ajovy. Insurance card with pharmacy benefit info now on file in computer.

## 2017-08-06 NOTE — Telephone Encounter (Signed)
I spoke with the patient and informed her that she was not covered, we canceled her apt and she would like to know what she can do in the mean time. Please call and advise.

## 2017-08-11 NOTE — Telephone Encounter (Signed)
Called pt again and LVM asking for call back. Left office number in message.  

## 2017-08-11 NOTE — Telephone Encounter (Signed)
Patient returned call. She stated that she remembers discussing Aimovig and not Ajovy. Ajovy information was included in pt's AVS. She is aware that both medications are monthly injectable anti-CGRP medications. She will research and will call her insurance and find out which one is preferred. Then the patient will call office back with her decision.

## 2017-08-13 ENCOUNTER — Ambulatory Visit: Payer: Self-pay | Admitting: Neurology

## 2017-08-30 ENCOUNTER — Encounter (HOSPITAL_COMMUNITY): Payer: Self-pay | Admitting: Emergency Medicine

## 2017-08-30 ENCOUNTER — Emergency Department (HOSPITAL_COMMUNITY): Payer: Medicaid Other

## 2017-08-30 ENCOUNTER — Emergency Department (HOSPITAL_COMMUNITY)
Admission: EM | Admit: 2017-08-30 | Discharge: 2017-08-30 | Disposition: A | Discharge status: Home / Self Care | Payer: Medicaid Other | Attending: Emergency Medicine | Admitting: Emergency Medicine

## 2017-08-30 DIAGNOSIS — S022XXA Fracture of nasal bones, initial encounter for closed fracture: Secondary | ICD-10-CM

## 2017-08-30 DIAGNOSIS — Y999 Unspecified external cause status: Secondary | ICD-10-CM | POA: Insufficient documentation

## 2017-08-30 DIAGNOSIS — Y929 Unspecified place or not applicable: Secondary | ICD-10-CM | POA: Insufficient documentation

## 2017-08-30 DIAGNOSIS — W500XXA Accidental hit or strike by another person, initial encounter: Secondary | ICD-10-CM | POA: Insufficient documentation

## 2017-08-30 DIAGNOSIS — Y939 Activity, unspecified: Secondary | ICD-10-CM | POA: Insufficient documentation
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
## 2017-08-30 NOTE — ED Notes (Signed)
Pt verbalized understanding discharge instructions and denies any further needs or questions at this time. VS stable, ambulatory and steady gait.   
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
## 2017-09-25 ENCOUNTER — Emergency Department (HOSPITAL_COMMUNITY): Payer: Self-pay

## 2017-09-25 ENCOUNTER — Emergency Department (HOSPITAL_COMMUNITY)
Admission: EM | Admit: 2017-09-25 | Discharge: 2017-09-25 | Disposition: A | Discharge status: Home / Self Care | Payer: Self-pay | Attending: Emergency Medicine | Admitting: Emergency Medicine

## 2017-09-25 ENCOUNTER — Ambulatory Visit (HOSPITAL_COMMUNITY): Payer: Self-pay

## 2017-09-25 ENCOUNTER — Encounter (HOSPITAL_COMMUNITY): Payer: Self-pay | Admitting: Emergency Medicine

## 2017-09-25 DIAGNOSIS — N12 Tubulo-interstitial nephritis, not specified as acute or chronic: Secondary | ICD-10-CM | POA: Insufficient documentation

## 2017-09-25 DIAGNOSIS — R7989 Other specified abnormal findings of blood chemistry: Secondary | ICD-10-CM | POA: Insufficient documentation

## 2017-09-25 DIAGNOSIS — Z79899 Other long term (current) drug therapy: Secondary | ICD-10-CM | POA: Insufficient documentation

## 2017-09-25 DIAGNOSIS — R945 Abnormal results of liver function studies: Secondary | ICD-10-CM

## 2017-09-25 DIAGNOSIS — R0602 Shortness of breath: Secondary | ICD-10-CM | POA: Insufficient documentation

## 2017-09-25 LAB — I-STAT TROPONIN, ED: TROPONIN I, POC: 0.02 ng/mL (ref 0.00–0.08)

## 2017-09-25 LAB — I-STAT BETA HCG BLOOD, ED (MC, WL, AP ONLY): I-stat hCG, quantitative: <5 m[IU]/mL (ref <5)

## 2017-09-25 LAB — BASIC METABOLIC PANEL
ANION GAP: 11 (ref 5–15)
BUN: 12 mg/dL (ref 6–20)
CHLORIDE: 105 mmol/L (ref 101–111)
CO2: 25 mmol/L (ref 22–32)
Calcium: 9.9 mg/dL (ref 8.9–10.3)
Creatinine, Ser: 0.76 mg/dL (ref 0.44–1.00)
GFR calc non Af Amer: >60 mL/min (ref >60)
GLUCOSE: 108 mg/dL — AB (ref 65–99)
Potassium: 3.8 mmol/L (ref 3.5–5.1)
Sodium: 141 mmol/L (ref 135–145)

## 2017-09-25 LAB — URINALYSIS, ROUTINE W REFLEX MICROSCOPIC
Bilirubin Urine: NEGATIVE
Glucose, UA: NEGATIVE mg/dL
Ketones, ur: NEGATIVE mg/dL
Nitrite: POSITIVE — AB
Protein, ur: 30 mg/dL — AB
SPECIFIC GRAVITY, URINE: 1.019 (ref 1.005–1.030)
WBC, UA: >50 WBC/hpf — ABNORMAL HIGH (ref 0–5)
pH: 5 (ref 5.0–8.0)

## 2017-09-25 LAB — CBC
HEMATOCRIT: 40 % (ref 36.0–46.0)
HEMOGLOBIN: 12.8 g/dL (ref 12.0–15.0)
MCH: 28.3 pg (ref 26.0–34.0)
MCHC: 32 g/dL (ref 30.0–36.0)
MCV: 88.5 fL (ref 78.0–100.0)
Platelets: 268 10*3/uL (ref 150–400)
RBC: 4.52 MIL/uL (ref 3.87–5.11)
RDW: 13.1 % (ref 11.5–15.5)
WBC: 10.2 10*3/uL (ref 4.0–10.5)

## 2017-09-25 LAB — HEPATIC FUNCTION PANEL
ALBUMIN: 4.3 g/dL (ref 3.5–5.0)
ALT: 275 U/L — AB (ref 14–54)
AST: 279 U/L — AB (ref 15–41)
Alkaline Phosphatase: 103 U/L (ref 38–126)
BILIRUBIN INDIRECT: 1 mg/dL — AB (ref 0.3–0.9)
Bilirubin, Direct: 0.3 mg/dL (ref 0.1–0.5)
TOTAL PROTEIN: 8.1 g/dL (ref 6.5–8.1)
Total Bilirubin: 1.3 mg/dL — ABNORMAL HIGH (ref 0.3–1.2)

## 2017-09-25 LAB — LIPASE, BLOOD: LIPASE: 38 U/L (ref 11–51)

## 2017-09-25 MED ORDER — PROCHLORPERAZINE EDISYLATE 10 MG/2ML IJ SOLN
10.0000 mg | Freq: Once | INTRAMUSCULAR | Status: AC
  Administered 2017-09-25: 10 mg via INTRAVENOUS
  Filled 2017-09-25: qty 2

## 2017-09-25 MED ORDER — OXYCODONE-ACETAMINOPHEN 5-325 MG PO TABS: 1.0000 | ORAL_TABLET | ORAL | 0 refills | Status: DC | PRN

## 2017-09-25 MED ORDER — SODIUM CHLORIDE 0.9 % IV SOLN
1.0000 g | Freq: Once | INTRAVENOUS | Status: AC
  Administered 2017-09-25: 1 g via INTRAVENOUS
  Filled 2017-09-25: qty 10

## 2017-09-25 MED ORDER — ONDANSETRON 8 MG PO TBDP: 8.0000 mg | ORAL_TABLET | Freq: Three times a day (TID) | ORAL | 0 refills | Status: DC | PRN

## 2017-09-25 MED ORDER — MORPHINE SULFATE (PF) 4 MG/ML IV SOLN
4.0000 mg | Freq: Once | INTRAVENOUS | Status: AC
Start: 2017-09-25 — End: 2017-09-25
  Administered 2017-09-25: 4 mg via INTRAVENOUS
  Filled 2017-09-25: qty 1

## 2017-09-25 MED ORDER — MORPHINE SULFATE (PF) 4 MG/ML IV SOLN
4.0000 mg | Freq: Once | INTRAVENOUS | Status: AC
  Administered 2017-09-25: 4 mg via INTRAVENOUS
  Filled 2017-09-25: qty 1

## 2017-09-25 MED ORDER — CEPHALEXIN 500 MG PO CAPS: 500.0000 mg | ORAL_CAPSULE | Freq: Three times a day (TID) | ORAL | 0 refills | Status: DC

## 2017-09-25 NOTE — ED Notes (Signed)
Pt discharged from ED; instructions provided and scripts given; Pt encouraged to return to ED if symptoms worsen and to f/u with PCP; Pt verbalized understanding of all instructions 

## 2017-09-25 NOTE — ED Provider Notes (Signed)
[facility]   CSN: [id number] Arrival date & time: 09/25/17  4098     History   Chief Complaint Chief Complaint  Patient presents with  . Chest Pain    HPI Bailey Patterson is a 57 y.o. female.  HPI Patient is a 57 year old female presents emergency department with atypical chest tightness with radiation towards her left side.  She also reports some mild shortness of breath today without productive clot.  She has a history of PE before in the past and has an IVC filter in place.  At time of arrival to the emergency department however her other complaints and seems to be more prominent it is complaints of pain in her upper abdomen and left side.  No diarrhea.  She reports some urinary frequency and dysuria over the past several days.  She took Azo with some improvement in her symptoms.  She has not been on antibiotics.  No vaginal complaints.  No fevers.  Denies nausea vomiting.   Past Medical History:  Diagnosis Date  . ANXIETY 11/16/2009  . CHEST WALL PAIN, ACUTE 04/16/2010  . Constipation   . DVT (deep venous thrombosis) (HCC)   . FATIGUE 03/28/2009  . Fibromyalgia   . Hernia   . KNEE PAIN 02/04/2010  . MENTAL CONFUSION 11/16/2009  . MIGRAINE, COMMON W/INTRACTABLE MIGRAINE 03/28/2009  . Nausea alone 03/28/2009  . Phlebitis, complicating pregnancy or puerperium   . Presence of IVC filter   . Vertigo     Patient Active Problem List   Diagnosis Date Noted  . Chronic migraine without aura, with intractable migraine, so stated, with status migrainosus 07/16/2017  . Hematochezia 08/11/2012  . Rectal pain, chronic 08/11/2012  . Unspecified constipation 08/11/2012  . Migraine headache 12/20/2010  . Paresthesia 12/20/2010  . CHEST WALL PAIN, ACUTE 04/16/2010  . KNEE PAIN 02/04/2010  . MENTAL CONFUSION 11/16/2009  . ANXIETY 11/16/2009  . FATIGUE 03/28/2009  . Nausea alone 03/28/2009    Past Surgical History:  Procedure  Laterality Date  . CESAREAN SECTION    . ENDOMETRIAL ABLATION    . HERNIA REPAIR       OB History   None      Home Medications    Prior to Admission medications   Medication Sig Start Date End Date Taking? Authorizing Provider  Albuterol Sulfate 108 (90 Base) MCG/ACT AEPB Inhale 2 puffs into the lungs 4 (four) times daily as needed.   Yes [provider]  aspirin-acetaminophen-caffeine (EXCEDRIN MIGRAINE) 7325498911 MG tablet Take 2 tablets by mouth every 6 (six) hours as needed for headache.   Yes [provider]  Butalbital-APAP-Caffeine 50-300-40 MG CAPS Take 1 capsule by mouth every 6 (six) hours as needed (migraine headache).  06/27/16  Yes [provider]  phenazopyridine (PYRIDIUM) 95 MG tablet Take 95 mg by mouth 3 (three) times daily as needed for pain.   Yes [provider]  Vitamin D, Ergocalciferol, (DRISDOL) 50000 units CAPS capsule Take 1 capsule by mouth once a week. 06/18/17  Yes [provider]  cephALEXin (KEFLEX) 500 MG capsule Take 1 capsule (500 mg total) by mouth 3 (three) times daily. 09/25/17   Azalia Bilis, MD  cyclobenzaprine (FLEXERIL) 10 MG tablet Take 1 tablet (10 mg total) by mouth 3 (three) times daily as needed for muscle spasms. Patient not taking: Reported on 09/25/2017 08/27/16   Adonis Huguenin, NP  ondansetron (ZOFRAN ODT) 8 MG disintegrating tablet Take 1 tablet (8  mg total) by mouth every 8 (eight) hours as needed for nausea or vomiting. 09/25/17   Azalia Bilis, MD  oxyCODONE-acetaminophen (PERCOCET/ROXICET) 5-325 MG tablet Take 1 tablet by mouth every 4 (four) hours as needed for severe pain. 09/25/17   Azalia Bilis, MD  tiZANidine (ZANAFLEX) 4 MG tablet Take 1 tablet (4 mg total) by mouth every 8 (eight) hours as needed for muscle spasms. Patient not taking: Reported on 09/25/2017 08/06/16   Adonis Huguenin, NP    Family History Family History  Problem Relation Age of Onset  . Diabetes Mother   . Heart  disease Mother   . Kidney disease Mother   . Hypertension Mother   . Stroke Mother   . Cervical cancer Mother   . Diabetes Other   . Hypertension Other   . Kidney disease Other   . Cancer Father     Social History Social History   Tobacco Use  . Smoking status: Never Smoker  . Smokeless tobacco: Never Used  Substance Use Topics  . Alcohol use: No  . Drug use: No     Allergies   Blueberry [vaccinium angustifolium]; Grapeseed extract [nutritional supplements]; Lyrica [pregabalin]; Oxycodone; Topiramate; and Hydrocodone-acetaminophen   Review of Systems Review of Systems  All other systems reviewed and are negative.    Physical Exam Updated Vital Signs BP 124/67   Pulse 92   Temp 99.4 F (37.4 C) (Oral)   Resp 18   SpO2 98%   Physical Exam  Constitutional: She is oriented to person, place, and time. She appears well-developed and well-nourished. No distress.  HENT:  Head: Normocephalic and atraumatic.  Eyes: EOM are normal.  Neck: Normal range of motion.  Cardiovascular: Normal rate, regular rhythm and normal heart sounds.  Pulmonary/Chest: Effort normal and breath sounds normal.  Abdominal: Soft. She exhibits no distension. There is no tenderness.  Musculoskeletal: Normal range of motion.  Neurological: She is alert and oriented to person, place, and time.  Skin: Skin is warm and dry.  Psychiatric: She has a normal mood and affect. Judgment normal.  Nursing note and vitals reviewed.    ED Treatments / Results  Labs (all labs ordered are listed, but only abnormal results are displayed) Labs Reviewed  BASIC METABOLIC PANEL - Abnormal; Notable for the following components:      Result Value   Glucose, Bld 108 (*)    All other components within normal limits  HEPATIC FUNCTION PANEL - Abnormal; Notable for the following components:   AST 279 (*)    ALT 275 (*)    Total Bilirubin 1.3 (*)    Indirect Bilirubin 1.0 (*)    All other components within  normal limits  URINALYSIS, ROUTINE W REFLEX MICROSCOPIC - Abnormal; Notable for the following components:   APPearance HAZY (*)    Hgb urine dipstick MODERATE (*)    Protein, ur 30 (*)    Nitrite POSITIVE (*)    Leukocytes, UA LARGE (*)    WBC, UA >50 (*)    Bacteria, UA FEW (*)    Non Squamous Epithelial 0-5 (*)    All other components within normal limits  CBC  LIPASE, BLOOD  HEPATITIS PANEL, ACUTE  I-STAT TROPONIN, ED  I-STAT BETA HCG BLOOD, ED ([facility], [facility], [facility] ONLY)    EKG EKG Interpretation  Date/Time:  Friday Sep 25 2017 08:47:48 EDT Ventricular Rate:  106 PR Interval:  144 QRS Duration: 68 QT Interval:  332 QTC Calculation: 441 R Axis:  86 Text Interpretation:  Sinus tachycardia Possible Left atrial enlargement Left ventricular hypertrophy Abnormal ECG No significant change was found Confirmed by Azalia Bilis ([id number]) on 09/25/2017 5:17:23 PM   Radiology Dg Chest 2 View  Result Date: 09/25/2017 CLINICAL DATA:  Chest pain EXAM: CHEST - 2 VIEW COMPARISON:  April 09, 2016 FINDINGS: There is bibasilar scarring with patchy bibasilar fibrosis. Lungs elsewhere are clear. The heart size and pulmonary vascularity are normal. No adenopathy. No bone lesions. No pneumothorax. There is a filter in the inferior vena cava. IMPRESSION: Patchy fibrosis and scarring in the bases. Lungs elsewhere clear. Stable cardiac silhouette. Electronically Signed   By: Bretta Bang III M.D.   On: 09/25/2017 09:24   US Abdomen Complete  Result Date: 09/25/2017 CLINICAL DATA:  Generalized abdominal pain over the last week. Renal infection. Elevated liver function tests. EXAM: ABDOMEN ULTRASOUND COMPLETE COMPARISON:  CT 11/06/2013 FINDINGS: Gallbladder: No gallstones or wall thickening visualized. No sonographic Murphy sign noted by sonographer. Common bile duct: Diameter: 5 mm, normal Liver: No focal lesion identified. Within normal limits in parenchymal echogenicity. Portal vein is patent on color  Doppler imaging with normal direction of blood flow towards the liver. IVC: No abnormality visualized in the peri hepatic segment. Poorly seen below that. IVC filter present on previous CT. Pancreas: Visualized portion unremarkable. Spleen: Size and appearance within normal limits. Right Kidney: Length: 10 cm. Echogenicity within normal limits. No mass or hydronephrosis visualized. Left Kidney: Length: 10.8 cm. Echogenicity within normal limits. No mass or hydronephrosis visualized. Abdominal aorta: No aneurysm visualized. Other findings: None. IMPRESSION: Normal abdominal ultrasound. No abnormality seen to explain the presentation. Electronically Signed   By: Paulina Fusi M.D.   On: 09/25/2017 14:43    Procedures Procedures (including critical care time)  Medications Ordered in ED Medications  cefTRIAXone (ROCEPHIN) 1 g in sodium chloride 0.9 % 100 mL IVPB (1 g Intravenous New Bag/Given 09/25/17 1700)  morphine 4 MG/ML injection 4 mg (4 mg Intravenous Given 09/25/17 1115)  morphine 4 MG/ML injection 4 mg (4 mg Intravenous Given 09/25/17 1543)  prochlorperazine (COMPAZINE) injection 10 mg (10 mg Intravenous Given 09/25/17 1543)     Initial Impression / Assessment and Plan / ED Course  I have reviewed the triage vital signs and the nursing notes.  Pertinent labs & imaging results that were available during my care of the patient were reviewed by me and considered in my medical decision making (see chart for details).     Atypical chest pain.  EKG without ischemic changes.  Troponin negative.  Elevation of LFTs noted.  Bilirubin is normal.  No focal right upper quadrant tenderness.  Abdominal ultrasound performed demonstrating no signs of gallstones or signs of cholecystitis.  Common bile duct is normal in size.  Acute hepatitis panel will be sent.  Outpatient primary care follow-up.  She will need repeat LFTs in 1 week.  I suspect much of her symptoms are more related to pyelonephritis.  Urine  culture sent.  IV Rocephin in the emergency department.  Home with Keflex.  Feels much better.  Overall well-appearing and stable for discharge from the ER.  Patient understands return the emergency department for new or worsening symptoms  Final Clinical Impressions(s) / ED Diagnoses   Final diagnoses:  Pyelonephritis  Elevated liver function tests    ED Discharge Orders        Ordered    cephALEXin (KEFLEX) 500 MG capsule  3 times daily     09/25/17  1646    oxyCODONE-acetaminophen (PERCOCET/ROXICET) 5-325 MG tablet  Every 4 hours PRN     09/25/17 1648    ondansetron (ZOFRAN ODT) 8 MG disintegrating tablet  Every 8 hours PRN     09/25/17 1648       Azalia Bilis, MD 09/25/17 1718

## 2017-09-25 NOTE — Discharge Instructions (Addendum)
You will need repeat liver function tests in 1 week

## 2017-09-25 NOTE — ED Triage Notes (Signed)
Pt reports generalized chest tightness with pain radiating down her left side. Reports some sob. Hx of blood clots, has ivc filter. resp e/u, nad.

## 2017-09-26 LAB — HEPATITIS PANEL, ACUTE
Hep A IgM: NEGATIVE
Hep B C IgM: NEGATIVE
Hepatitis B Surface Ag: NEGATIVE

## 2017-10-20 ENCOUNTER — Telehealth: Payer: Self-pay | Admitting: Neurology

## 2017-10-20 NOTE — Telephone Encounter (Signed)
Pt states her insurance will not cover her for Aimovig.  Pt would like a call back to discuss a type piercing in the center of her ear that is supposed to help with migraines.  Please call

## 2017-10-21 NOTE — Telephone Encounter (Signed)
Spoke with patient. Discussed that there is no evidence that daith piercing works. Encouraged pt to go to www.[employer]migrainefoundation.org and search "daith". There is an article there that discusses the piercing. Then pt can make her own decision about it. The patient was very appreciative and said she would get back with our office about her insurance.

## 2017-10-21 NOTE — Telephone Encounter (Signed)
Here is a site about this: https://americanmigrainefoundation.org/resource-library/daith-piercings-101/ ask if can email it through [email or web address]. There is no evidence that the Daith piercing work.

## 2017-10-21 NOTE — Telephone Encounter (Signed)
Called pt. She stated that just left her current job and is starting a new one next week. She is unsure if she still has active insurance, thinks it might have expired, and will look into this. She will call us back with the information. Pt was hesitant to have Aimovig ordered at this time due to being unsure about insurance. She is asking about Dr. Trevor MaceAhern's thoughts on a daith piercing. RN stated she would send message to Dr. Lucia GaskinsAhern for her comments. Pt appreciative.

## 2018-06-10 ENCOUNTER — Telehealth: Payer: Self-pay | Admitting: Neurology

## 2018-06-10 NOTE — Telephone Encounter (Signed)
Noted, thanks!

## 2018-06-10 NOTE — Telephone Encounter (Signed)
Patient would like a call back regarding setting up Botox treatments. She no longer has insurance and will be self-pay. She wants estimates, etc. To see if this is even possible. Best call back is [phone]

## 2018-06-10 NOTE — Telephone Encounter (Signed)
I checked with Bailey Patterson and she stated that patient currently has 543 dollar debt in our office. Before we could schedule her for Botox she would have to pay that. I called to make the patient aware and quoted her OOP cost for Botox. She said she would call back.

## 2019-04-21 ENCOUNTER — Other Ambulatory Visit: Payer: Self-pay

## 2019-04-21 ENCOUNTER — Encounter (HOSPITAL_COMMUNITY): Payer: Self-pay

## 2019-04-21 ENCOUNTER — Ambulatory Visit (HOSPITAL_COMMUNITY)
Admission: EM | Admit: 2019-04-21 | Discharge: 2019-04-21 | Disposition: A | Discharge status: Home / Self Care | Payer: BLUE CROSS/BLUE SHIELD | Attending: Family Medicine | Admitting: Family Medicine

## 2019-04-21 DIAGNOSIS — G43011 Migraine without aura, intractable, with status migrainosus: Secondary | ICD-10-CM

## 2019-04-21 MED ORDER — KETOROLAC TROMETHAMINE 60 MG/2ML IM SOLN
INTRAMUSCULAR | Status: AC
  Filled 2019-04-21: qty 2

## 2019-04-21 MED ORDER — METOCLOPRAMIDE HCL 5 MG/ML IJ SOLN
5.0000 mg | Freq: Once | INTRAMUSCULAR | Status: AC
  Administered 2019-04-21: 5 mg via INTRAMUSCULAR

## 2019-04-21 MED ORDER — DEXAMETHASONE SODIUM PHOSPHATE 10 MG/ML IJ SOLN
10.0000 mg | Freq: Once | INTRAMUSCULAR | Status: AC
  Administered 2019-04-21: 10 mg via INTRAMUSCULAR

## 2019-04-21 MED ORDER — KETOROLAC TROMETHAMINE 60 MG/2ML IM SOLN
60.0000 mg | Freq: Once | INTRAMUSCULAR | Status: AC
  Administered 2019-04-21: 60 mg via INTRAMUSCULAR

## 2019-04-21 MED ORDER — DEXAMETHASONE SODIUM PHOSPHATE 10 MG/ML IJ SOLN
INTRAMUSCULAR | Status: AC
  Filled 2019-04-21: qty 1

## 2019-04-21 MED ORDER — METOCLOPRAMIDE HCL 5 MG/ML IJ SOLN
INTRAMUSCULAR | Status: AC
  Filled 2019-04-21: qty 2

## 2019-04-21 NOTE — ED Provider Notes (Signed)
[facility]    CSN: [id number] Arrival date & time: 04/21/19  4081      History   Chief Complaint Chief Complaint  Patient presents with  . Appointment    830  . Migraine    HPI Bailey Patterson is a 58 y.o. female.   Patient is a 58 year old female past medical history of anxiety, constipation, DVT, fatigue, fibromyalgia, migraine, vertigo.  She presents today with approximately 1 week of headache.  The headache is worsened over the last 4 days.  She took 2 [employer] powders last night which helped somewhat.  The migraine recurred this morning.  Reporting some pulsating pain in the base of her skull radiating around to the frontal area.  Some associated nausea, blurred vision and light sensitivity.  Denies any head injuries.  Similar symptoms with previous migraines.  She has taken many different treatments in the past without much relief of her migraines.  She is scheduled to see neurology in April.  No dizziness, weakness, numbness, tingling, slurred speech.   ROS per HPI      Past Medical History:  Diagnosis Date  . ANXIETY 11/16/2009  . CHEST WALL PAIN, ACUTE 04/16/2010  . Constipation   . DVT (deep venous thrombosis) (HCC)   . FATIGUE 03/28/2009  . Fibromyalgia   . Hernia   . KNEE PAIN 02/04/2010  . MENTAL CONFUSION 11/16/2009  . MIGRAINE, COMMON W/INTRACTABLE MIGRAINE 03/28/2009  . Nausea alone 03/28/2009  . Phlebitis, complicating pregnancy or puerperium   . Presence of IVC filter   . Vertigo     Patient Active Problem List   Diagnosis Date Noted  . Chronic migraine without aura, with intractable migraine, so stated, with status migrainosus 07/16/2017  . Hematochezia 08/11/2012  . Rectal pain, chronic 08/11/2012  . Unspecified constipation 08/11/2012  . Migraine headache 12/20/2010  . Paresthesia 12/20/2010  . CHEST WALL PAIN, ACUTE 04/16/2010  . KNEE PAIN 02/04/2010  . MENTAL CONFUSION 11/16/2009  . ANXIETY 11/16/2009  . FATIGUE 03/28/2009  .  Nausea alone 03/28/2009    Past Surgical History:  Procedure Laterality Date  . CESAREAN SECTION    . ENDOMETRIAL ABLATION    . HERNIA REPAIR      OB History   No obstetric history on file.      Home Medications    Prior to Admission medications   Medication Sig Start Date End Date Taking? Authorizing Provider  Albuterol Sulfate 108 (90 Base) MCG/ACT AEPB Inhale 2 puffs into the lungs 4 (four) times daily as needed.    [provider]  aspirin-acetaminophen-caffeine (EXCEDRIN MIGRAINE) 5874907416 MG tablet Take 2 tablets by mouth every 6 (six) hours as needed for headache.    [provider]  Butalbital-APAP-Caffeine 50-300-40 MG CAPS Take 1 capsule by mouth every 6 (six) hours as needed (migraine headache).  06/27/16   [provider]  phenazopyridine (PYRIDIUM) 95 MG tablet Take 95 mg by mouth 3 (three) times daily as needed for pain.    [provider]  Vitamin D, Ergocalciferol, (DRISDOL) 50000 units CAPS capsule Take 1 capsule by mouth once a week. 06/18/17   [provider]    Family History Family History  Problem Relation Age of Onset  . Diabetes Mother   . Heart disease Mother   . Kidney disease Mother   . Hypertension Mother   . Stroke Mother   . Cervical cancer Mother   . Diabetes Other   . Hypertension Other   . Kidney  disease Other   . Cancer Father     Social History Social History   Tobacco Use  . Smoking status: Never Smoker  . Smokeless tobacco: Never Used  Substance Use Topics  . Alcohol use: No  . Drug use: No     Allergies   Blueberry [vaccinium angustifolium], Grapeseed extract [nutritional supplements], Lyrica [pregabalin], Oxycodone, Topiramate, and Hydrocodone-acetaminophen   Review of Systems Review of Systems   Physical Exam Triage Vital Signs ED Triage Vitals  Enc Vitals Group     BP 04/21/19 0846 134/82     Pulse Rate 04/21/19 0846 87     Resp 04/21/19 0846 16     Temp 04/21/19  0846 98.7 F (37.1 C)     Temp Source 04/21/19 0846 Oral     SpO2 04/21/19 0846 98 %     Weight 04/21/19 0847 140 lb (63.5 kg)     Height --      Head Circumference --      Peak Flow --      Pain Score 04/21/19 0846 8     Pain Loc --      Pain Edu? --      Excl. in [place]? --    No data found.  Updated Vital Signs BP 134/82 (BP Location: Right Arm)   Pulse 87   Temp 98.7 F (37.1 C) (Oral)   Resp 16   Wt 140 lb (63.5 kg)   SpO2 98%   BMI 24.80 kg/m   Visual Acuity Right Eye Distance:   Left Eye Distance:   Bilateral Distance:    Right Eye Near:   Left Eye Near:    Bilateral Near:     Physical Exam Vitals and nursing note reviewed.  Constitutional:      General: She is not in acute distress.    Appearance: She is well-developed.     Comments: Eye squinting. Pt in dark room.   HENT:     Head: Normocephalic and atraumatic.     Nose: Nose normal.  Eyes:     General:        Right eye: No discharge.        Left eye: No discharge.     Extraocular Movements: Extraocular movements intact.     Conjunctiva/sclera: Conjunctivae normal.     Pupils: Pupils are equal, round, and reactive to light.  Pulmonary:     Effort: Pulmonary effort is normal.  Abdominal:     Palpations: Abdomen is soft.     Tenderness: There is no abdominal tenderness.  Musculoskeletal:        General: Normal range of motion.     Cervical back: Normal range of motion and neck supple. No tenderness.  Skin:    General: Skin is warm and dry.  Neurological:     General: No focal deficit present.     Mental Status: She is alert.     Cranial Nerves: No cranial nerve deficit.     Motor: No weakness.     Gait: Gait normal.  Psychiatric:        Mood and Affect: Mood normal.      UC Treatments / Results  Labs (all labs ordered are listed, but only abnormal results are displayed) Labs Reviewed - No data to display  EKG   Radiology No results found.  Procedures Procedures (including  critical care time)  Medications Ordered in UC Medications  ketorolac (TORADOL) injection 60 mg (60 mg Intramuscular Given 04/21/19 0949)  metoCLOPramide (REGLAN) injection 5 mg (5 mg Intramuscular Given 04/21/19 0953)  dexamethasone (DECADRON) injection 10 mg (10 mg Intramuscular Given 04/21/19 0950)    Initial Impression / Assessment and Plan / UC Course  I have reviewed the triage vital signs and the nursing notes.  Pertinent labs & imaging results that were available during my care of the patient were reviewed by me and considered in my medical decision making (see chart for details).     Intractable migraine without aura, status migrainous-patient neurologically intact. No red flags on exam Treated with migraine cocktail here in clinic.  Patient had some relief with this.  We will have her go home and rest and follow-up with neurology Recommended if symptoms continue or worsen she will need to go to the ER.  Patient understanding and agree. Final Clinical Impressions(s) / UC Diagnoses   Final diagnoses:  Intractable migraine without aura and with status migrainosus     Discharge Instructions     Treated you for migraine here with decadron, Toradol and Reglan.  Go home and rest.  Drink plenty of fluid and avoid triggers. You need to try to get in with your neurologist.  You can do Excedrin migraine OTC or combine tylenol extra strength and ibuprofen  For worsening symptoms go to the ER    ED Prescriptions    None     PDMP not reviewed this encounter.   Dahlia ByesBast, Jaylyn Booher A, NP 04/21/19 1124

## 2019-04-21 NOTE — Discharge Instructions (Addendum)
Treated you for migraine here with decadron, Toradol and Reglan.  Go home and rest.  Drink plenty of fluid and avoid triggers. You need to try to get in with your neurologist.  You can do Excedrin migraine OTC or combine tylenol extra strength and ibuprofen  For worsening symptoms go to the ER

## 2019-04-21 NOTE — ED Triage Notes (Signed)
Pt states she has a migraine x 4 days. Pt states she has been used goodie power for pain.

## 2020-01-20 ENCOUNTER — Other Ambulatory Visit: Payer: Self-pay | Admitting: Critical Care Medicine

## 2020-01-20 ENCOUNTER — Other Ambulatory Visit: Payer: Medicaid Other

## 2020-01-20 DIAGNOSIS — Z20822 Contact with and (suspected) exposure to covid-19: Secondary | ICD-10-CM

## 2020-01-23 LAB — NOVEL CORONAVIRUS, NAA: SARS-CoV-2, NAA: DETECTED — AB

## 2020-01-24 ENCOUNTER — Encounter: Payer: Self-pay | Admitting: Nurse Practitioner

## 2020-01-24 ENCOUNTER — Other Ambulatory Visit (HOSPITAL_COMMUNITY): Payer: Self-pay | Admitting: Nurse Practitioner

## 2020-01-24 DIAGNOSIS — U071 COVID-19: Secondary | ICD-10-CM

## 2020-01-24 NOTE — Progress Notes (Signed)
I connected by phone with Bailey Patterson on 01/24/2020 at 10:28 AM to discuss the potential use of an new treatment for mild to moderate COVID-19 viral infection in non-hospitalized patients.  This patient is a 59 y.o. female that meets the FDA criteria for Emergency Use Authorization of casirivimab\imdevimab.  Has a (+) direct SARS-CoV-2 viral test result  Has mild or moderate COVID-19   Is ? 57 years of age and weighs ? 40 kg  Is NOT hospitalized due to COVID-19  Is NOT requiring oxygen therapy or requiring an increase in baseline oxygen flow rate due to COVID-19  Is within 10 days of symptom onset  Has at least one of the high risk factor(s) for progression to severe COVID-19 and/or hospitalization as defined in EUA.  Specific high risk criteria : Other high risk medical condition per CDC:  social vulnerability   Sx onset 9/9.    I have spoken and communicated the following to the patient or parent/caregiver:  1. FDA has authorized the emergency use of casirivimab\imdevimab for the treatment of mild to moderate COVID-19 in adults and pediatric patients with positive results of direct SARS-CoV-2 viral testing who are 85 years of age and older weighing at least 40 kg, and who are at high risk for progressing to severe COVID-19 and/or hospitalization.  2. The significant known and potential risks and benefits of casirivimab\imdevimab, and the extent to which such potential risks and benefits are unknown.  3. Information on available alternative treatments and the risks and benefits of those alternatives, including clinical trials.  4. Patients treated with casirivimab\imdevimab should continue to self-isolate and use infection control measures (e.g., wear mask, isolate, social distance, avoid sharing personal items, clean and disinfect "high touch" surfaces, and frequent handwashing) according to CDC guidelines.   5. The patient or parent/caregiver has the option to accept or refuse  casirivimab\imdevimab .  After reviewing this information with the patient, The patient agreed to proceed with receiving casirivimab\imdevimab infusion and will be provided a copy of the Fact sheet prior to receiving the infusion.[name], DNP, AGNP-C [phone] (Infusion Center Hotline)

## 2020-01-25 ENCOUNTER — Ambulatory Visit (HOSPITAL_COMMUNITY)
Admission: RE | Admit: 2020-01-25 | Discharge: 2020-01-25 | Disposition: A | Discharge status: Home / Self Care | Payer: Medicaid Other | Source: Ambulatory Visit | Attending: Pulmonary Disease | Admitting: Pulmonary Disease

## 2020-01-25 DIAGNOSIS — U071 COVID-19: Secondary | ICD-10-CM | POA: Insufficient documentation

## 2020-01-25 MED ORDER — FAMOTIDINE IN NACL 20-0.9 MG/50ML-% IV SOLN: 20.0000 mg | Freq: Once | INTRAVENOUS | Status: DC | PRN

## 2020-01-25 MED ORDER — METHYLPREDNISOLONE SODIUM SUCC 125 MG IJ SOLR: 125.0000 mg | Freq: Once | INTRAMUSCULAR | Status: DC | PRN

## 2020-01-25 MED ORDER — EPINEPHRINE 0.3 MG/0.3ML IJ SOAJ: 0.3000 mg | Freq: Once | INTRAMUSCULAR | Status: DC | PRN

## 2020-01-25 MED ORDER — DIPHENHYDRAMINE HCL 50 MG/ML IJ SOLN: 50.0000 mg | Freq: Once | INTRAMUSCULAR | Status: DC | PRN

## 2020-01-25 MED ORDER — SODIUM CHLORIDE 0.9 % IV SOLN
1200.0000 mg | Freq: Once | INTRAVENOUS | Status: AC
  Administered 2020-01-25: 1200 mg via INTRAVENOUS
  Filled 2020-01-25: qty 10

## 2020-01-25 MED ORDER — ALBUTEROL SULFATE HFA 108 (90 BASE) MCG/ACT IN AERS: 2.0000 | INHALATION_SPRAY | Freq: Once | RESPIRATORY_TRACT | Status: DC | PRN

## 2020-01-25 MED ORDER — SODIUM CHLORIDE 0.9 % IV SOLN: INTRAVENOUS | Status: DC | PRN

## 2020-01-25 NOTE — Discharge Instructions (Signed)

## 2020-01-25 NOTE — Progress Notes (Signed)
  Diagnosis: COVID-19  Physician: Dr. Patrick Wright  Procedure: Covid Infusion Clinic Med: casirivimab\imdevimab infusion - Provided patient with casirivimab\imdevimab fact sheet for patients, parents and caregivers prior to infusion.  Complications: No immediate complications noted.  Discharge: Discharged home   Carly Sabo Dawn 01/25/2020   

## 2020-02-02 ENCOUNTER — Other Ambulatory Visit: Payer: Self-pay | Admitting: Nurse Practitioner

## 2020-02-02 ENCOUNTER — Other Ambulatory Visit: Payer: Self-pay

## 2020-02-02 ENCOUNTER — Telehealth: Payer: Self-pay | Admitting: *Deleted

## 2020-02-02 ENCOUNTER — Ambulatory Visit (INDEPENDENT_AMBULATORY_CARE_PROVIDER_SITE_OTHER): Payer: Self-pay | Admitting: Nurse Practitioner

## 2020-02-02 ENCOUNTER — Ambulatory Visit
Admission: RE | Admit: 2020-02-02 | Discharge: 2020-02-02 | Disposition: A | Discharge status: Home / Self Care | Payer: Medicaid Other | Source: Ambulatory Visit | Attending: Nurse Practitioner | Admitting: Nurse Practitioner

## 2020-02-02 VITALS — BP 128/86 | HR 81 | Temp 97.3°F | Ht 62.5 in | Wt 130.0 lb

## 2020-02-02 DIAGNOSIS — M545 Low back pain, unspecified: Secondary | ICD-10-CM | POA: Insufficient documentation

## 2020-02-02 DIAGNOSIS — R05 Cough: Secondary | ICD-10-CM

## 2020-02-02 DIAGNOSIS — Z8616 Personal history of COVID-19: Secondary | ICD-10-CM

## 2020-02-02 DIAGNOSIS — R059 Cough, unspecified: Secondary | ICD-10-CM | POA: Insufficient documentation

## 2020-02-02 HISTORY — DX: Low back pain, unspecified: M54.50

## 2020-02-02 HISTORY — DX: Cough, unspecified: R05.9

## 2020-02-02 LAB — POCT URINALYSIS DIPSTICK
Bilirubin, UA: NEGATIVE
Blood, UA: NEGATIVE
Glucose, UA: NEGATIVE
Ketones, UA: NEGATIVE
Leukocytes, UA: NEGATIVE
Nitrite, UA: NEGATIVE
Protein, UA: NEGATIVE
Spec Grav, UA: >=1.03 — AB (ref 1.010–1.025)
Urobilinogen, UA: 1 E.U./dL
pH, UA: 5.5 (ref 5.0–8.0)

## 2020-02-02 MED ORDER — PREDNISONE 10 MG PO TABS: ORAL_TABLET | ORAL | 0 refills | Status: DC

## 2020-02-02 MED ORDER — AZITHROMYCIN 250 MG PO TABS: ORAL_TABLET | ORAL | 0 refills | Status: DC

## 2020-02-02 NOTE — Patient Instructions (Addendum)
Covid 19 Cough:   Stay well hydrated  Stay active  Deep breathing exercises  May start vitamin C 2,000 mg daily, vitamin D3 2,000 IU daily, Zinc 220 mg daily, and Quercetin 500 mg twice daily  May take tylenol or fever or pain  May take mucinex DM twice daily  May start zyrtec daily  Will order chest x ray  UA checked in office was negative   Follow up:  Follow up as needed

## 2020-02-02 NOTE — Progress Notes (Signed)
@Patient  ID: , female    DOB: 06-08-1960, 59 y.o.   MRN: [id number]  Chief Complaint  Patient presents with  . Covid Positive    Pos: 9/10 Infusion 9/15 Sx: Fatigue, cough up phlegm, sneezing    Referring provider: 10/15, MD   59 year old female with history of migraine, DVT, anxiety, fatigue. Diagnosed with covid on 9/10.    HPI  Patient presents today for post COVID care clinic.  She was diagnosed with Covid on 01/20/2020.  She had monoclonal antibody infusion on 01/25/2020.  She states that overall she has been doing well until yesterday afternoon when she developed a cough productive of thick sputum.  She complains this morning of fatigue and some low back pain.  Vital signs are stable. Denies f/c/s, n/v/d, hemoptysis, PND, chest pain or edema.      Allergies  Allergen Reactions  . Blueberry [Vaccinium Angustifolium] Anaphylaxis  . Grapeseed Extract [Nutritional Supplements] Anaphylaxis    Reaction to muscadines   . Lyrica [Pregabalin] Swelling    Body and throat swell  . Oxycodone Itching and Nausea And Vomiting  . Topiramate     REACTION: confusion-pt thinks 100mg  made her confused  . Hydrocodone-Acetaminophen Rash and Hives    Immunization History  Administered Date(s) Administered  . Influenza,inj,Quad PF,6+ Mos 01/31/2013    Past Medical History:  Diagnosis Date  . ANXIETY 11/16/2009  . CHEST WALL PAIN, ACUTE 04/16/2010  . Constipation   . DVT (deep venous thrombosis) (HCC)   . FATIGUE 03/28/2009  . Fibromyalgia   . Hernia   . KNEE PAIN 02/04/2010  . MENTAL CONFUSION 11/16/2009  . MIGRAINE, COMMON W/INTRACTABLE MIGRAINE 03/28/2009  . Nausea alone 03/28/2009  . Phlebitis, complicating pregnancy or puerperium   . Presence of IVC filter   . Vertigo     Tobacco History: Social History   Tobacco Use  Smoking Status Never Smoker  Smokeless Tobacco Never Used   Counseling given: Yes   Outpatient Encounter Medications as of  02/02/2020  Medication Sig  . aspirin-acetaminophen-caffeine (EXCEDRIN MIGRAINE) 250-250-65 MG tablet Take 2 tablets by mouth every 6 (six) hours as needed for headache.  . Albuterol Sulfate 108 (90 Base) MCG/ACT AEPB Inhale 2 puffs into the lungs 4 (four) times daily as needed. (Patient not taking: Reported on 02/02/2020)  . phenazopyridine (PYRIDIUM) 95 MG tablet Take 95 mg by mouth 3 (three) times daily as needed for pain. (Patient not taking: Reported on 02/02/2020)  . Vitamin D, Ergocalciferol, (DRISDOL) 50000 units CAPS capsule Take 1 capsule by mouth once a week. (Patient not taking: Reported on 02/02/2020)  . [DISCONTINUED] Butalbital-APAP-Caffeine 50-300-40 MG CAPS Take 1 capsule by mouth every 6 (six) hours as needed (migraine headache).    No facility-administered encounter medications on file as of 02/02/2020.     Review of Systems  Review of Systems  Constitutional: Negative.  Negative for fever.  HENT: Negative.   Respiratory: Positive for cough. Negative for shortness of breath.   Cardiovascular: Negative.  Negative for chest pain, palpitations and leg swelling.  Gastrointestinal: Negative.   Musculoskeletal:       Low back pain  Allergic/Immunologic: Negative.   Neurological: Negative.   Psychiatric/Behavioral: Negative for decreased concentration.       Physical Exam  BP 128/86   Pulse 81   Temp (!) 97.3 F (36.3 C)   Ht 5' 2.5" (1.588 m)   Wt 130 lb (59 kg)   SpO2 99%   BMI  23.40 kg/m   Wt Readings from Last 5 Encounters:  02/02/20 130 lb (59 kg)  04/21/19 140 lb (63.5 kg)  08/30/17 118 lb (53.5 kg)  07/16/17 122 lb (55.3 kg)  08/27/16 127 lb (57.6 kg)     Physical Exam Vitals and nursing note reviewed.  Constitutional:      General: She is not in acute distress.    Appearance: She is well-developed.  Cardiovascular:     Rate and Rhythm: Normal rate and regular rhythm.  Pulmonary:     Effort: Pulmonary effort is normal.     Breath sounds: Normal  breath sounds.  Musculoskeletal:     Right lower leg: No edema.     Left lower leg: No edema.  Neurological:     Mental Status: She is alert and oriented to person, place, and time.  Psychiatric:        Mood and Affect: Mood normal.        Behavior: Behavior normal.       Assessment & Plan:   History of COVID-19  Covid 19 Cough:   Stay well hydrated  Stay active  Deep breathing exercises  May start vitamin C 2,000 mg daily, vitamin D3 2,000 IU daily, Zinc 220 mg daily, and Quercetin 500 mg twice daily  May take tylenol or fever or pain  May take mucinex DM twice daily  May start zyrtec daily  Will order chest x ray  UA checked in office was negative   Follow up:  Follow up as needed      Ivonne Andrew, NP 02/02/2020

## 2020-02-02 NOTE — Telephone Encounter (Signed)
Bailey Patterson with [facility] calling to reports critical CXR. "Covid pneumonia."  Results are in Epic for viewing.  Attempted to reach center, rings over to [facility].  Message left with Bailey Patterson regarding results.

## 2020-02-02 NOTE — Assessment & Plan Note (Signed)
Covid 19 Cough:   Stay well hydrated  Stay active  Deep breathing exercises  May start vitamin C 2,000 mg daily, vitamin D3 2,000 IU daily, Zinc 220 mg daily, and Quercetin 500 mg twice daily  May take tylenol or fever or pain  May take mucinex DM twice daily  May start zyrtec daily  Will order chest x ray  UA checked in office was negative   Follow up:  Follow up as needed  

## 2020-02-03 ENCOUNTER — Other Ambulatory Visit: Payer: Self-pay | Admitting: Nurse Practitioner

## 2020-02-03 DIAGNOSIS — U071 COVID-19: Secondary | ICD-10-CM

## 2020-02-08 ENCOUNTER — Ambulatory Visit: Payer: Self-pay

## 2020-02-08 NOTE — Telephone Encounter (Signed)
°  Pt. Reports she she is 17 days post COVID 19 and still having mild shortness of breath. States she does not have a PCP. Given [facility] number.Verbalizes understanding. Instructed to go to ED for worsening of symptoms. Answer Assessment - Initial Assessment Questions 1. COVID-19 DIAGNOSIS: "Who made your Coronavirus (COVID-19) diagnosis?" "Was it confirmed by a positive lab test?" If not diagnosed by a HCP, ask "Are there lots of cases (community spread) where you live?" (See public health department website, if unsure)     Yes 2. COVID-19 EXPOSURE: "Was there any known exposure to COVID before the symptoms began?" CDC Definition of close contact: within 6 feet (2 meters) for a total of 15 minutes or more over a 24-hour period.      NO 3. ONSET: "When did the COVID-19 symptoms start?"      17 days ago 4. WORST SYMPTOM: "What is your worst symptom?" (e.g., cough, fever, shortness of breath, muscle aches)     Mild shortness of breath 5. COUGH: "Do you have a cough?" If Yes, ask: "How bad is the cough?"       Mild 6. FEVER: "Do you have a fever?" If Yes, ask: "What is your temperature, how was it measured, and when did it start?"     No 7. RESPIRATORY STATUS: "Describe your breathing?" (e.g., shortness of breath, wheezing, unable to speak)      Mild shortness of breath 8. BETTER-SAME-WORSE: "Are you getting better, staying the same or getting worse compared to yesterday?"  If getting worse, ask, "In what way?"     Same 9. HIGH RISK DISEASE: "Do you have any chronic medical problems?" (e.g., asthma, heart or lung disease, weak immune system, obesity, etc.)     Yes 10. PREGNANCY: "Is there any chance you are pregnant?" "When was your last menstrual period?"       No 11. OTHER SYMPTOMS: "Do you have any other symptoms?"  (e.g., chills, fatigue, headache, loss of smell or taste, muscle pain, sore throat; new loss of smell or taste especially support the diagnosis of COVID-19)        No  Protocols used: CORONAVIRUS (COVID-19) DIAGNOSED OR SUSPECTED-A-AH

## 2020-02-09 ENCOUNTER — Telehealth: Payer: Self-pay | Admitting: Family Medicine

## 2020-02-09 NOTE — Telephone Encounter (Signed)
Pt called concerning prescription. Message sent to provider in basket

## 2020-02-14 ENCOUNTER — Encounter: Payer: Self-pay | Admitting: Pulmonary Disease

## 2020-02-14 ENCOUNTER — Ambulatory Visit: Payer: Self-pay | Admitting: Pulmonary Disease

## 2020-02-14 ENCOUNTER — Other Ambulatory Visit: Payer: Self-pay

## 2020-02-14 VITALS — BP 114/76 | HR 96 | Temp 98.2°F | Ht 62.0 in | Wt 134.0 lb

## 2020-02-14 DIAGNOSIS — U071 COVID-19: Secondary | ICD-10-CM

## 2020-02-14 DIAGNOSIS — J1282 Pneumonia due to coronavirus disease 2019: Secondary | ICD-10-CM

## 2020-02-14 DIAGNOSIS — R0982 Postnasal drip: Secondary | ICD-10-CM

## 2020-02-14 MED ORDER — FLUTICASONE PROPIONATE 50 MCG/ACT NA SUSP: 1.0000 | Freq: Every day | NASAL | 2 refills | Status: DC

## 2020-02-14 MED ORDER — ALBUTEROL SULFATE 108 (90 BASE) MCG/ACT IN AEPB
2.0000 | INHALATION_SPRAY | Freq: Four times a day (QID) | RESPIRATORY_TRACT | 3 refills | Status: DC | PRN
Start: 2020-02-14 — End: 2021-12-26

## 2020-02-14 MED ORDER — FLOVENT HFA 220 MCG/ACT IN AERO: 2.0000 | INHALATION_SPRAY | Freq: Two times a day (BID) | RESPIRATORY_TRACT | 12 refills | Status: DC

## 2020-02-14 NOTE — Patient Instructions (Signed)
Start flovent inhaler, 2 puffs twice daily (rinse mouth out with water after use) Use albuterol inhaler 1-2 puffs as needed every 4-6 hours Continue Zyrtec daily for allergies Start Flonase nasal spray, 1 spray per nostril daily for sinus congestion and drainage Follow up as needed

## 2020-02-14 NOTE — Progress Notes (Signed)
Synopsis: Referred by Angus Seller, NP for Covid 19 Pneumonia  Subjective:   PATIENT ID: Bailey Patterson GENDER: female DOB: [birth year], MRN: [id number]   HPI  Chief Complaint  Patient presents with   Consult    SOB a liittle. Feels like she is panting. Coughs up sputum at night.    Jae Bruck is a 59 year old woman, never smoker who is referred to pulmonary clinic for evaluation of shortness of breath after Covid 19 Pneumonia.  She tested positive on 01/20/20 for Covid 19 and on 01/25/20 received Regeneron monoclonal antibody infusion. She continued to have shortness of breath and a chest radiograph was performed on 9/23 which showed bilateral patchy opacities with a peripheral predominance. She was started on azithromycin and prednisone taper at that time. She did benefit from the steroids and azithromycin but continues to have exertional dyspnea. She also complains of cough with sputum production. She has sinus congestion and drainage. She notices the drainage at night when she lays down. She complains of scratchy throat. She denies heartburn or reflux. She has seasonal allergies that are worse in the Fall and Spring which she will take zyrtec as needed.   Past Medical History:  Diagnosis Date   ANXIETY 11/16/2009   CHEST WALL PAIN, ACUTE 04/16/2010   Constipation    DVT (deep venous thrombosis) (HCC)    FATIGUE 03/28/2009   Fibromyalgia    Hernia    KNEE PAIN 02/04/2010   MENTAL CONFUSION 11/16/2009   MIGRAINE, COMMON W/INTRACTABLE MIGRAINE 03/28/2009   Nausea alone 03/28/2009   Phlebitis, complicating pregnancy or puerperium    Presence of IVC filter    Vertigo      Family History  Problem Relation Age of Onset   Diabetes Mother    Heart disease Mother    Kidney disease Mother    Hypertension Mother    Stroke Mother    Cervical cancer Mother    Diabetes Other    Hypertension Other    Kidney disease Other    Cancer Father      Social  History   Socioeconomic History   Marital status: Divorced    Spouse name: Not on file   Number of children: 5   Years of education: Not on file   Highest education level: Some college, no degree  Occupational History   Not on file  Tobacco Use   Smoking status: Never Smoker   Smokeless tobacco: Never Used  Vaping Use   Vaping Use: Never used  Substance and Sexual Activity   Alcohol use: No   Drug use: No   Sexual activity: Yes  Other Topics Concern   Not on file  Social History Narrative   Lives at home alone   Right handed   Drinks a cup of caffeine once a week   Social Determinants of Health   Financial Resource Strain:    Difficulty of Paying Living Expenses: Not on file  Food Insecurity:    Worried About Programme researcher, broadcasting/film/video in the Last Year: Not on file   [employer] of Food in the Last Year: Not on file  Transportation Needs:    Lack of Transportation (Medical): Not on file   Lack of Transportation (Non-Medical): Not on file  Physical Activity:    Days of Exercise per Week: Not on file   Minutes of Exercise per Session: Not on file  Stress:    Feeling of Stress : Not on file  Social Connections:  Frequency of Communication with Friends and Family: Not on file   Frequency of Social Gatherings with Friends and Family: Not on file   Attends Religious Services: Not on file   Active Member of Clubs or Organizations: Not on file   Attends Banker Meetings: Not on file   Marital Status: Not on file  Intimate Partner Violence:    Fear of Current or Ex-Partner: Not on file   Emotionally Abused: Not on file   Physically Abused: Not on file   Sexually Abused: Not on file     Allergies  Allergen Reactions   Blueberry [Vaccinium Angustifolium] Anaphylaxis   Grapeseed Extract [Nutritional Supplements] Anaphylaxis    Reaction to muscadines    Lyrica [Pregabalin] Swelling    Body and throat swell   Oxycodone Itching and  Nausea And Vomiting   Topiramate     REACTION: confusion-pt thinks 100mg  made her confused   Hydrocodone-Acetaminophen Rash and Hives     Outpatient Medications Prior to Visit  Medication Sig Dispense Refill   aspirin-acetaminophen-caffeine (EXCEDRIN MIGRAINE) 250-250-65 MG tablet Take 2 tablets by mouth every 6 (six) hours as needed for headache.     Vitamin D, Ergocalciferol, (DRISDOL) 50000 units CAPS capsule Take 1 capsule by mouth once a week.   5   Albuterol Sulfate 108 (90 Base) MCG/ACT AEPB Inhale 2 puffs into the lungs 4 (four) times daily as needed.      azithromycin (ZITHROMAX) 250 MG tablet Take 2 tablets (500 mg) on day 1, then take 1 tablet (250 mg) on days 2-5 (Patient not taking: Reported on 02/14/2020) 6 tablet 0   phenazopyridine (PYRIDIUM) 95 MG tablet Take 95 mg by mouth 3 (three) times daily as needed for pain. (Patient not taking: Reported on 02/02/2020)     predniSONE (DELTASONE) 10 MG tablet Take 4 tabs for 2 days, then 3 tabs for 2 days, then 2 tabs for 2 days, then 1 tab for 2 days, then stop (Patient not taking: Reported on 02/14/2020) 20 tablet 0   No facility-administered medications prior to visit.    Review of Systems  Constitutional: Negative for chills, diaphoresis, fever, malaise/fatigue and weight loss.  HENT: Positive for congestion. Negative for nosebleeds, sinus pain and sore throat.        Scratchy throat  Respiratory: Positive for cough, sputum production and shortness of breath. Negative for hemoptysis and wheezing.   Cardiovascular: Negative for chest pain, palpitations, orthopnea, claudication, leg swelling and PND.  Gastrointestinal: Negative for abdominal pain, blood in stool, diarrhea, heartburn, nausea and vomiting.  Genitourinary: Negative for dysuria and hematuria.  Musculoskeletal: Negative for joint pain and myalgias.  Skin: Negative for itching and rash.  Neurological: Positive for headaches. Negative for weakness.    Endo/Heme/Allergies: Does not bruise/bleed easily.  Psychiatric/Behavioral: Negative.    Objective:   Vitals:   02/14/20 1504  BP: 114/76  Pulse: 96  Temp: 98.2 F (36.8 C)  TempSrc: Oral  SpO2: 99%  Weight: 134 lb (60.8 kg)  Height: 5\' 2"  (1.575 m)     Physical Exam Constitutional:      General: She is not in acute distress.    Appearance: Normal appearance. She is normal weight. She is not ill-appearing.  HENT:     Head: Normocephalic and atraumatic.     Nose: Congestion present.     Mouth/Throat:     Mouth: Mucous membranes are moist.     Pharynx: Posterior oropharyngeal erythema present.  Eyes:  General: No scleral icterus.    Extraocular Movements: Extraocular movements intact.     Conjunctiva/sclera: Conjunctivae normal.     Pupils: Pupils are equal, round, and reactive to light.  Cardiovascular:     Rate and Rhythm: Normal rate and regular rhythm.     Pulses: Normal pulses.     Heart sounds: Normal heart sounds. No murmur heard.   Pulmonary:     Effort: Pulmonary effort is normal.     Breath sounds: Normal breath sounds. No wheezing or rales.  Abdominal:     General: Bowel sounds are normal. There is no distension.     Palpations: Abdomen is soft.     Tenderness: There is no abdominal tenderness.  Musculoskeletal:     Cervical back: Neck supple.     Right lower leg: No edema.     Left lower leg: No edema.  Lymphadenopathy:     Cervical: No cervical adenopathy.  Skin:    General: Skin is warm and dry.     Capillary Refill: Capillary refill takes less than 2 seconds.  Neurological:     General: No focal deficit present.     Mental Status: She is alert and oriented to person, place, and time. Mental status is at baseline.  Psychiatric:        Mood and Affect: Mood normal.        Behavior: Behavior normal.        Thought Content: Thought content normal.        Judgment: Judgment normal.     CBC    Component Value Date/Time   WBC 10.2  09/25/2017 0905   RBC 4.52 09/25/2017 0905   HGB 12.8 09/25/2017 0905   HCT 40.0 09/25/2017 0905   PLT 268 09/25/2017 0905   MCV 88.5 09/25/2017 0905   MCH 28.3 09/25/2017 0905   MCHC 32.0 09/25/2017 0905   RDW 13.1 09/25/2017 0905   LYMPHSABS 2.1 03/25/2015 1235   MONOABS 0.5 03/25/2015 1235   EOSABS 0.1 03/25/2015 1235   BASOSABS 0.0 03/25/2015 1235   BMP Latest Ref Rng & Units 09/25/2017 07/16/2017 10/14/2016  Glucose 65 - 99 mg/dL 409(W108(H) 119(J102(H) 86  BUN 6 - 20 mg/dL 12 16 12   Creatinine 0.44 - 1.00 mg/dL 4.780.76 2.950.66 6.210.67  BUN/Creat Ratio 9 - 23 - 24(H) -  Sodium 135 - 145 mmol/L 141 144 139  Potassium 3.5 - 5.1 mmol/L 3.8 5.0 3.8  Chloride 101 - 111 mmol/L 105 106 109  CO2 22 - 32 mmol/L 25 24 22   Calcium 8.9 - 10.3 mg/dL 9.9 30.810.0 9.8    Chest imaging: CXR 02/02/20 Vague peripheral nodular opacities in the left mid and lower lung region. Coarse markings at the lung bases appear to be chronic. Heart size is within normal limits and stable. Patient has IVC filter in the abdomen. No large pleural effusions. Trachea is midline.    Assessment & Plan:   Pneumonia due to COVID-19 virus  Post-nasal drip  Discussion: Bailey PantherCynthia Riepe is a 59 year old woman, never smoker who is referred to pulmonary clinic for evaluation of shortness of breath after Covid 19 Pneumonia.  She continues to experience dyspnea mainly with exertion secondary to the covid 19 pneumonia. She is to start flovent 220mcg 2 puffs twice daily for the next month and albuterol inhaler as needed. We have provided her with a spacer to use. She can resume normal physical activity as tolerated.   In regards to her sinus congestion and  post-nasal drip, she is to start flonase daily and continue her Zyrtec daily for seasonal allergies.   Follow up as needed.  Melody Comas, MD  Pulmonary & Critical Care Office: [phone]    Current Outpatient Medications:    Albuterol Sulfate (PROAIR RESPICLICK) 108 (90  Base) MCG/ACT AEPB, Inhale 2 puffs into the lungs 4 (four) times daily as needed., Disp: 1 each, Rfl: 3   aspirin-acetaminophen-caffeine (EXCEDRIN MIGRAINE) 250-250-65 MG tablet, Take 2 tablets by mouth every 6 (six) hours as needed for headache., Disp: , Rfl:    Vitamin D, Ergocalciferol, (DRISDOL) 50000 units CAPS capsule, Take 1 capsule by mouth once a week. , Disp: , Rfl: 5   fluticasone (FLONASE) 50 MCG/ACT nasal spray, Place 1 spray into both nostrils daily., Disp: 16 g, Rfl: 2   fluticasone (FLOVENT HFA) 220 MCG/ACT inhaler, Inhale 2 puffs into the lungs in the morning and at bedtime., Disp: 1 each, Rfl: 12

## 2020-03-06 ENCOUNTER — Ambulatory Visit (INDEPENDENT_AMBULATORY_CARE_PROVIDER_SITE_OTHER): Payer: Self-pay | Admitting: Nurse Practitioner

## 2020-03-06 ENCOUNTER — Other Ambulatory Visit: Payer: Self-pay | Admitting: Nurse Practitioner

## 2020-03-06 VITALS — BP 118/82 | HR 77 | Temp 97.5°F | Wt 133.0 lb

## 2020-03-06 DIAGNOSIS — R059 Cough, unspecified: Secondary | ICD-10-CM

## 2020-03-06 DIAGNOSIS — Z8616 Personal history of COVID-19: Secondary | ICD-10-CM

## 2020-03-06 DIAGNOSIS — Z1152 Encounter for screening for COVID-19: Secondary | ICD-10-CM | POA: Insufficient documentation

## 2020-03-06 DIAGNOSIS — R439 Unspecified disturbances of smell and taste: Secondary | ICD-10-CM | POA: Insufficient documentation

## 2020-03-06 HISTORY — DX: Unspecified disturbances of smell and taste: R43.9

## 2020-03-06 MED ORDER — AZITHROMYCIN 250 MG PO TABS: ORAL_TABLET | ORAL | 0 refills | Status: DC

## 2020-03-06 MED ORDER — PREDNISONE 10 MG PO TABS: ORAL_TABLET | ORAL | 0 refills | Status: DC

## 2020-03-06 MED FILL — predniSONE 10 MG TABS: 10 | 6 days supply | Qty: 20 | Fill #0

## 2020-03-06 MED FILL — AZITHROMYCIN 250 MG TABLET: 250 | 5 days supply | Qty: 6 | Fill #0

## 2020-03-06 NOTE — Patient Instructions (Addendum)
History of COVID-19 Cough Altered taste and smell:   Stay well hydrated  Stay active  Deep breathing exercises  May start vitamin C 2,000 mg daily, vitamin D3 2,000 IU daily, Zinc 220 mg daily, and Quercetin 500 mg twice daily  May take tylenol or fever or pain  May take mucinex DM twice daily  May start zyrtec daily  Please keep follow up with Pulmonary  [facility]  May try on-line olfactory re-training using essential oils.     Follow up:  Follow up as needed

## 2020-03-06 NOTE — Progress Notes (Signed)
@Patient  ID: , female    DOB: 1960-10-09, 59 y.o.   MRN: [id number]  Chief Complaint  Patient presents with  . Follow-up    Smell is not all the way back, feeling better overall has seen Pulmonary    Referring provider: [id number], MD   59 year old female with history of migraine, DVT, anxiety, fatigue. Diagnosed with covid on 9/10.   HPI  Patient presents today for post COVID care clinic.  She was diagnosed with Covid on 01/20/2020.  She had monoclonal antibody infusion on 01/25/2020.  Patient was last seen with office on 02/02/2020.  Chest x-ray revealed worsening pneumonia.  Patient was ordered antibiotic and prednisone.  She was unable to start this medication due to cost.  She was seen by pulmonary on 02/14/2020 and was ordered another round of antibiotics and prednisone but patient states that she did not take the medication again due to cost.  I will try to send in another prescription of azithromycin and prednisone [facility] so that hopefully patient can afford this medication.  Patient states that she has been stable and does feel like she is improving.  She is still having issues with altered taste and smell.  We discussed possibly doing online olfactory training using essential oils. Denies f/c/s, n/v/d, hemoptysis, PND, chest pain or edema.      Allergies  Allergen Reactions  . Blueberry [Vaccinium Angustifolium] Anaphylaxis  . Grapeseed Extract [Nutritional Supplements] Anaphylaxis    Reaction to muscadines   . Lyrica [Pregabalin] Swelling    Body and throat swell  . Oxycodone Itching and Nausea And Vomiting  . Topiramate     REACTION: confusion-pt thinks 100mg  made her confused  . Hydrocodone-Acetaminophen Rash and Hives    Immunization History  Administered Date(s) Administered  . Influenza,inj,Quad PF,6+ Mos 01/31/2013    Past Medical History:  Diagnosis Date  . ANXIETY 11/16/2009  . CHEST WALL PAIN, ACUTE  04/16/2010  . Constipation   . DVT (deep venous thrombosis) (HCC)   . FATIGUE 03/28/2009  . Fibromyalgia   . Hernia   . KNEE PAIN 02/04/2010  . MENTAL CONFUSION 11/16/2009  . MIGRAINE, COMMON W/INTRACTABLE MIGRAINE 03/28/2009  . Nausea alone 03/28/2009  . Phlebitis, complicating pregnancy or puerperium   . Presence of IVC filter   . Vertigo     Tobacco History: Social History   Tobacco Use  Smoking Status Never Smoker  Smokeless Tobacco Never Used   Counseling given: Not Answered   Outpatient Encounter Medications as of 03/06/2020  Medication Sig  . Albuterol Sulfate (PROAIR RESPICLICK) 108 (90 Base) MCG/ACT AEPB Inhale 2 puffs into the lungs 4 (four) times daily as needed.  03/30/2009 aspirin-acetaminophen-caffeine (EXCEDRIN MIGRAINE) 250-250-65 MG tablet Take 2 tablets by mouth every 6 (six) hours as needed for headache.  . fluticasone (FLONASE) 50 MCG/ACT nasal spray Place 1 spray into both nostrils daily.  . fluticasone (FLOVENT HFA) 220 MCG/ACT inhaler Inhale 2 puffs into the lungs in the morning and at bedtime.  . Vitamin D, Ergocalciferol, (DRISDOL) 50000 units CAPS capsule Take 1 capsule by mouth once a week.   03/08/2020 azithromycin (ZITHROMAX) 250 MG tablet Take 2 tablets (500 mg) on day 1, then take 1 tablet (250 mg) on days 2-5  . predniSONE (DELTASONE) 10 MG tablet Take 4 tabs for 2 days, then 3 tabs for 2 days, then 2 tabs for 2 days, then 1 tab for 2 days, then stop  No facility-administered encounter medications on file as of 03/06/2020.     Review of Systems  Review of Systems  Constitutional: Negative.   HENT: Negative.   Respiratory: Positive for cough. Negative for shortness of breath.   Cardiovascular: Negative.  Negative for chest pain and leg swelling.  Gastrointestinal: Negative.   Allergic/Immunologic: Negative.   Neurological: Negative.        Altered taste and smell.  Psychiatric/Behavioral: Negative.        Physical Exam  BP 118/82   Pulse 77   Temp  (!) 97.5 F (36.4 C)   Wt 133 lb 0.1 oz (60.3 kg)   SpO2 98%   BMI 24.33 kg/m   Wt Readings from Last 5 Encounters:  03/06/20 133 lb 0.1 oz (60.3 kg)  02/14/20 134 lb (60.8 kg)  02/02/20 130 lb (59 kg)  04/21/19 140 lb (63.5 kg)  08/30/17 118 lb (53.5 kg)     Physical Exam Vitals and nursing note reviewed.  Constitutional:      General: She is not in acute distress.    Appearance: She is well-developed.  Cardiovascular:     Rate and Rhythm: Normal rate and regular rhythm.  Pulmonary:     Effort: Pulmonary effort is normal.     Breath sounds: Normal breath sounds.  Musculoskeletal:     Right lower leg: No edema.     Left lower leg: No edema.  Neurological:     Mental Status: She is alert and oriented to person, place, and time.  Psychiatric:        Mood and Affect: Mood normal.        Behavior: Behavior normal.       Assessment & Plan:   History of COVID-19 Cough Altered taste and smell:   Stay well hydrated  Stay active  Deep breathing exercises  May start vitamin C 2,000 mg daily, vitamin D3 2,000 IU daily, Zinc 220 mg daily, and Quercetin 500 mg twice daily  May take tylenol or fever or pain  May take mucinex DM twice daily  May start zyrtec daily  Please keep follow up with Pulmonary  Will [facility] azithromycin [facility]one [facility]  May try on-line olfactory re-training using essential oils.     Follow up:  Follow up as needed     Ivonne Andrew, NP 03/06/2020

## 2020-03-06 NOTE — Assessment & Plan Note (Signed)
Cough Altered taste and smell:   Stay well hydrated  Stay active  Deep breathing exercises  May start vitamin C 2,000 mg daily, vitamin D3 2,000 IU daily, Zinc 220 mg daily, and Quercetin 500 mg twice daily  May take tylenol or fever or pain  May take mucinex DM twice daily  May start zyrtec daily  Please keep follow up with Pulmonary  Will send azithromycin and prednisone [facility]  May try on-line olfactory re-training using essential oils.     Follow up:  Follow up as needed

## 2020-03-07 LAB — NOVEL CORONAVIRUS, NAA: SARS-CoV-2, NAA: NOT DETECTED

## 2020-03-07 LAB — SARS-COV-2, NAA 2 DAY TAT

## 2020-09-22 ENCOUNTER — Ambulatory Visit
Admission: RE | Admit: 2020-09-22 | Discharge: 2020-09-22 | Disposition: A | Discharge status: Home / Self Care | Payer: 59 | Source: Ambulatory Visit | Attending: Emergency Medicine | Admitting: Emergency Medicine

## 2020-09-22 ENCOUNTER — Ambulatory Visit (INDEPENDENT_AMBULATORY_CARE_PROVIDER_SITE_OTHER): Payer: 59

## 2020-09-22 ENCOUNTER — Other Ambulatory Visit: Payer: Self-pay

## 2020-09-22 VITALS — BP 106/76 | HR 87 | Temp 97.9°F | Resp 16

## 2020-09-22 DIAGNOSIS — M79644 Pain in right finger(s): Secondary | ICD-10-CM

## 2020-09-22 DIAGNOSIS — M25541 Pain in joints of right hand: Secondary | ICD-10-CM

## 2020-09-22 DIAGNOSIS — G43909 Migraine, unspecified, not intractable, without status migrainosus: Secondary | ICD-10-CM

## 2020-09-22 MED ORDER — ONDANSETRON 4 MG PO TBDP
4.0000 mg | ORAL_TABLET | Freq: Once | ORAL | Status: AC
  Administered 2020-09-22: 4 mg via ORAL

## 2020-09-22 MED ORDER — NAPROXEN 500 MG PO TABS: 500.0000 mg | ORAL_TABLET | Freq: Two times a day (BID) | ORAL | 0 refills | Status: DC | PRN

## 2020-09-22 MED ORDER — KETOROLAC TROMETHAMINE 30 MG/ML IJ SOLN
30.0000 mg | Freq: Once | INTRAMUSCULAR | Status: AC
  Administered 2020-09-22: 30 mg via INTRAMUSCULAR

## 2020-09-22 MED ORDER — ERYTHROMYCIN 5 MG/GM OP OINT: TOPICAL_OINTMENT | OPHTHALMIC | 0 refills | Status: DC

## 2020-09-22 MED ORDER — DEXAMETHASONE SODIUM PHOSPHATE 10 MG/ML IJ SOLN
10.0000 mg | Freq: Once | INTRAMUSCULAR | Status: AC
  Administered 2020-09-22: 10 mg via INTRAMUSCULAR

## 2020-09-22 NOTE — ED Triage Notes (Signed)
Pt said she has hx of migraines so x 1 week having migraine and then her right eye pain with blood vessels in her eye is red and itchy and mucus in her right eye. also swelling in her knuckles in the right hand

## 2020-09-22 NOTE — Discharge Instructions (Signed)
X-ray without signs of fracture, more suggestive of underlying arthritis We gave you Toradol and Decadron with Zofran for your headache/nausea Continue with Naprosyn twice daily with food for joint pain, headaches as needed Please follow-up with primary care for further evaluation of underlying arthritis and migraines Return if any symptoms not improving or worsening

## 2020-09-22 NOTE — ED Provider Notes (Signed)
[name]    CSN: [id number] Arrival date & time: 09/22/20  0914      History   Chief Complaint Chief Complaint  Patient presents with  . Eye Pain  . Headache  . Hand Pain    HPI Bailey Patterson is a 60 y.o. female history of migraines, fibromyalgia, presenting today for evaluation of migraines as well as right eye itching and drainage along with swelling to right middle finger.  Reports that she has had a migraine over the past week located in frontal area bilaterally, but slightly worse on right side.  Reports some mild associated nausea.  History of similar with migraines in the past.  Does report some mild photophobia.  Denies vision changes.  Denies any weakness or numbness or tingling.  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  Has had difficulty bending at DIP on the same finger.  Reports of conjunctival hemorrhage to right eye recently which has been improving, but has had some pustular discharge along with some mild discomfort in this eye.  Concerned about pinkeye due to grandkids.  HPI  Past Medical History:  Diagnosis Date  . ANXIETY 11/16/2009  . CHEST WALL PAIN, ACUTE 04/16/2010  . Constipation   . DVT (deep venous thrombosis) (HCC)   . FATIGUE 03/28/2009  . Fibromyalgia   . Hernia   . KNEE PAIN 02/04/2010  . MENTAL CONFUSION 11/16/2009  . MIGRAINE, COMMON W/INTRACTABLE MIGRAINE 03/28/2009  . Nausea alone 03/28/2009  . Phlebitis, complicating pregnancy or puerperium   . Presence of IVC filter   . Vertigo     Patient Active Problem List   Diagnosis Date Noted  . Encounter for screening for COVID-19 03/06/2020  . 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 History of COVID-19 02/02/2020  . Cough 02/02/2020  . Acute bilateral low back pain without sciatica 02/02/2020  . Chronic migraine without aura, with intractable migraine, so stated, with  status migrainosus 07/16/2017  . Hematochezia 08/11/2012  . Rectal pain, chronic 08/11/2012  . Unspecified constipation 08/11/2012  . Migraine headache 12/20/2010  . Paresthesia 12/20/2010  . CHEST WALL PAIN, ACUTE 04/16/2010  . KNEE PAIN 02/04/2010  . MENTAL CONFUSION 11/16/2009  . ANXIETY 11/16/2009  . FATIGUE 03/28/2009  . Nausea alone 03/28/2009    Past Surgical History:  Procedure Laterality Date  . CESAREAN SECTION    . ENDOMETRIAL ABLATION    . HERNIA REPAIR      OB History   No obstetric history on file.      Home Medications    Prior to Admission medications   Medication Sig Start Date End Date Taking? Authorizing Provider  erythromycin ophthalmic ointment Place a 1/2 inch ribbon of ointment into the lower eyelid 4 times daily x 1 week 09/22/20  Yes Tamitha Norell C, PA-C  naproxen (NAPROSYN) 500 MG tablet Take 1 tablet (500 mg total) by mouth 2 (two) times daily as needed for headache. 09/22/20  Yes Laiza Veenstra C, PA-C  Albuterol Sulfate (PROAIR RESPICLICK) 108 (90 Base) MCG/ACT AEPB Inhale 2 puffs into the lungs 4 (four) times daily as needed. 02/14/20   Martina Sinner, MD  aspirin-acetaminophen-caffeine (EXCEDRIN MIGRAINE) [phone] MG tablet Take 2 tablets by mouth every 6 (six) hours as needed for headache.    [provider]  azithromycin (ZITHROMAX) 250 MG tablet TAKE 2 TABLETS (500 MG) ON DAY 1, THEN  TAKE 1 TABLET (250 MG) ON DAYS 2-5 03/06/20 03/06/21  Ivonne AndrewNichols, Tonya S, NP  fluticasone (FLONASE) 50 MCG/ACT nasal spray Place 1 spray into both nostrils daily. 02/14/20   Martina Sinnerewald, Jonathan B, MD  fluticasone (FLOVENT HFA) 220 MCG/ACT inhaler Inhale 2 puffs into the lungs in the morning and at bedtime. 02/14/20   Martina Sinnerewald, Jonathan B, MD  predniSONE (DELTASONE) 10 MG tablet TAKE 4 TABS FOR 2 DAYS, THEN 3 TABS FOR 2 DAYS, THEN 2 TABS FOR 2 DAYS, THEN 1 TAB FOR 2 DAYS, THEN STOP 03/06/20 03/06/21  Ivonne AndrewNichols, Tonya S, NP  Vitamin D, Ergocalciferol, (DRISDOL)  50000 units CAPS capsule Take 1 capsule by mouth once a week.  06/18/17   [provider]    Family History Family History  Problem Relation Age of Onset  . Diabetes Mother   . Heart disease Mother   . Kidney disease Mother   . Hypertension Mother   . Stroke Mother   . Cervical cancer Mother   . Diabetes Other   . Hypertension Other   . Kidney disease Other   . Cancer Father     Social History Social History   Tobacco Use  . Smoking status: Never Smoker  . Smokeless tobacco: Never Used  Vaping Use  . Vaping Use: Never used  Substance Use Topics  . Alcohol use: No  . Drug use: No     Allergies   Blueberry [vaccinium angustifolium], Grapeseed extract [nutritional supplements], Lyrica [pregabalin], Oxycodone, Topiramate, and Hydrocodone-acetaminophen   Review of Systems Review of Systems  Constitutional: Negative for fatigue and fever.  HENT: Negative for congestion, sinus pressure and sore throat.   Eyes: Positive for redness and itching. Negative for photophobia, pain and visual disturbance.  Respiratory: Negative for cough and shortness of breath.   Cardiovascular: Negative for chest pain.  Gastrointestinal: Negative for abdominal pain, nausea and vomiting.  Genitourinary: Negative for decreased urine volume and hematuria.  Musculoskeletal: Positive for arthralgias and joint swelling. Negative for myalgias, neck pain and neck stiffness.  Neurological: Positive for headaches. Negative for dizziness, syncope, facial asymmetry, speech difficulty, weakness, light-headedness and numbness.     Physical Exam Triage Vital Signs ED Triage Vitals  Enc Vitals Group     BP 09/22/20 0935 106/76     Pulse Rate 09/22/20 0935 87     Resp 09/22/20 0935 16     Temp 09/22/20 0935 97.9 F (36.6 C)     Temp Source 09/22/20 0935 Oral     SpO2 09/22/20 0935 98 %     Weight --      Height --      Head Circumference --      Peak Flow --      Pain Score 09/22/20 0933 7      Pain Loc --      Pain Edu? --      Excl. in GC? --    No data found.  Updated Vital Signs BP 106/76 (BP Location: Left Arm)   Pulse 87   Temp 97.9 F (36.6 C) (Oral)   Resp 16   SpO2 98%   Visual Acuity Right Eye Distance:   Left Eye Distance:   Bilateral Distance:    Right Eye Near:   Left Eye Near:    Bilateral Near:     Physical Exam Vitals and nursing note reviewed.  Constitutional:      Appearance: She is well-developed.     Comments: No acute distress  HENT:  Head: Normocephalic and atraumatic.     Nose: Nose normal.     Mouth/Throat:     Comments: Oral mucosa pink and moist, no tonsillar enlargement or exudate. Posterior pharynx patent and nonerythematous, no uvula deviation or swelling. Normal phonation.  Eyes:     Extraocular Movements: Extraocular movements intact.     Pupils: Pupils are equal, round, and reactive to light.     Comments: Right eye with mild subconjunctival hemorrhage noted to medial aspect, otherwise no significant conjunctival erythema, no obvious discharge noted  Cardiovascular:     Rate and Rhythm: Normal rate.  Pulmonary:     Effort: Pulmonary effort is normal. No respiratory distress.     Comments: Breathing comfortably at rest, CTABL, no wheezing, rales or other adventitious sounds auscultated Abdominal:     General: There is no distension.  Musculoskeletal:        General: Normal range of motion.     Cervical back: Neck supple.     Comments: Right middle finger with moderate swelling around PIP, DIP in hyperextension of same finger without full active range of motion at DIP  Skin:    General: Skin is warm and dry.  Neurological:     General: No focal deficit present.     Mental Status: She is alert and oriented to person, place, and time. Mental status is at baseline.     Cranial Nerves: No cranial nerve deficit.     Motor: No weakness.     Gait: Gait normal.      UC Treatments / Results  Labs (all labs ordered  are listed, but only abnormal results are displayed) Labs Reviewed - No data to display  EKG   Radiology DG Finger Middle Right  Result Date: 09/22/2020 CLINICAL DATA:  Right long finger pain x 1 month. EXAM: RIGHT MIDDLE FINGER 2+V COMPARISON:  None. FINDINGS: No evidence of acute fracture or dislocation. There are a a few tiny osteophytes at the lateral aspect of the PIP joint which could be degenerative or represent sequela of remote trauma. No aggressive osseous lesion. There is soft tissue swelling about the PIP joint. IMPRESSION: 1. No acute osseous abnormality. A few small osteophytes at the lateral aspect of the PIP joint could represent degenerative change or sequela of remote prior trauma. 2. Soft tissue swelling about the PIP joint. Electronically Signed   By: Emmaline Kluver M.D.   On: 09/22/2020 10:17    Procedures Procedures (including critical care time)  Medications Ordered in UC Medications  ketorolac (TORADOL) 30 MG/ML injection 30 mg (30 mg Intramuscular Given 09/22/20 1036)  dexamethasone (DECADRON) injection 10 mg (10 mg Intramuscular Given 09/22/20 1036)  ondansetron (ZOFRAN-ODT) disintegrating tablet 4 mg (4 mg Oral Given 09/22/20 1036)    Initial Impression / Assessment and Plan / UC Course  I have reviewed the triage vital signs and the nursing notes.  Pertinent labs & imaging results that were available during my care of the patient were reviewed by me and considered in my medical decision making (see chart for details).     X-ray negative for fracture, more suggestive of arthritis, encouraged follow-up with sports medicine/hand for further evaluation of this, recommending NSAIDs in the interim.  Headache treated with Toradol and Decadron prior to discharge, continue with Naprosyn as needed for headaches, no red flags, similar to prior migraines.  Covering for conjunctivitis with erythromycin ointment given exposure to grandkids, but recommended to continue  warm compresses and subconjunctival hemorrhage should resolve spontaneously.  Discussed strict return precautions. Patient verbalized understanding and is agreeable with plan.  Final Clinical Impressions(s) / UC Diagnoses   Final diagnoses:  Migraine without status migrainosus, not intractable, unspecified migraine type  Arthralgia of right hand     Discharge Instructions     X-ray without signs of fracture, more suggestive of underlying arthritis We gave you Toradol and Decadron with Zofran for your headache/nausea Continue with Naprosyn twice daily with food for joint pain, headaches as needed Please follow-up with primary care for further evaluation of underlying arthritis and migraines Return if any symptoms not improving or worsening    ED Prescriptions    Medication Sig Dispense Auth. Provider   naproxen (NAPROSYN) 500 MG tablet Take 1 tablet (500 mg total) by mouth 2 (two) times daily as needed for headache. 30 tablet Deshia Vanderhoof C, PA-C   erythromycin ophthalmic ointment Place a 1/2 inch ribbon of ointment into the lower eyelid 4 times daily x 1 week 3.5 g Asante Blanda, Trinity C, PA-C     PDMP not reviewed this encounter.   Lew Dawes, PA-C 09/22/20 1211

## 2020-09-26 ENCOUNTER — Encounter (HOSPITAL_COMMUNITY): Payer: Self-pay

## 2020-09-26 ENCOUNTER — Other Ambulatory Visit: Payer: Self-pay

## 2020-09-26 ENCOUNTER — Emergency Department (HOSPITAL_COMMUNITY)
Admission: EM | Admit: 2020-09-26 | Discharge: 2020-09-26 | Disposition: A | Discharge status: Home / Self Care | Payer: 59 | Attending: Emergency Medicine | Admitting: Emergency Medicine

## 2020-09-26 DIAGNOSIS — Z8616 Personal history of COVID-19: Secondary | ICD-10-CM | POA: Insufficient documentation

## 2020-09-26 DIAGNOSIS — R1013 Epigastric pain: Secondary | ICD-10-CM | POA: Diagnosis present

## 2020-09-26 DIAGNOSIS — K2900 Acute gastritis without bleeding: Secondary | ICD-10-CM | POA: Diagnosis not present

## 2020-09-26 DIAGNOSIS — Z7982 Long term (current) use of aspirin: Secondary | ICD-10-CM | POA: Insufficient documentation

## 2020-09-26 LAB — URINALYSIS, ROUTINE W REFLEX MICROSCOPIC
Bilirubin Urine: NEGATIVE
Glucose, UA: NEGATIVE mg/dL
Hgb urine dipstick: NEGATIVE
Ketones, ur: 5 mg/dL — AB
Leukocytes,Ua: NEGATIVE
Nitrite: NEGATIVE
Protein, ur: NEGATIVE mg/dL
Specific Gravity, Urine: 1.027 (ref 1.005–1.030)
pH: 5 (ref 5.0–8.0)

## 2020-09-26 LAB — COMPREHENSIVE METABOLIC PANEL
ALT: 19 U/L (ref 0–44)
AST: 19 U/L (ref 15–41)
Albumin: 4.7 g/dL (ref 3.5–5.0)
Alkaline Phosphatase: 75 U/L (ref 38–126)
Anion gap: 8 (ref 5–15)
BUN: 15 mg/dL (ref 6–20)
CO2: 27 mmol/L (ref 22–32)
Calcium: 9.6 mg/dL (ref 8.9–10.3)
Chloride: 106 mmol/L (ref 98–111)
Creatinine, Ser: 0.74 mg/dL (ref 0.44–1.00)
GFR, Estimated: >60 mL/min (ref >60)
Glucose, Bld: 92 mg/dL (ref 70–99)
Potassium: 4 mmol/L (ref 3.5–5.1)
Sodium: 141 mmol/L (ref 135–145)
Total Bilirubin: 0.4 mg/dL (ref 0.3–1.2)
Total Protein: 8.1 g/dL (ref 6.5–8.1)

## 2020-09-26 LAB — CBC WITH DIFFERENTIAL/PLATELET
Abs Immature Granulocytes: 0.02 10*3/uL (ref 0.00–0.07)
Basophils Absolute: 0 10*3/uL (ref 0.0–0.1)
Basophils Relative: 1 %
Eosinophils Absolute: 0.1 10*3/uL (ref 0.0–0.5)
Eosinophils Relative: 1 %
HCT: 43.8 % (ref 36.0–46.0)
Hemoglobin: 14 g/dL (ref 12.0–15.0)
Immature Granulocytes: 0 %
Lymphocytes Relative: 37 %
Lymphs Abs: 2.4 10*3/uL (ref 0.7–4.0)
MCH: 28.6 pg (ref 26.0–34.0)
MCHC: 32 g/dL (ref 30.0–36.0)
MCV: 89.6 fL (ref 80.0–100.0)
Monocytes Absolute: 0.5 10*3/uL (ref 0.1–1.0)
Monocytes Relative: 7 %
Neutro Abs: 3.5 10*3/uL (ref 1.7–7.7)
Neutrophils Relative %: 54 %
Platelets: 384 10*3/uL (ref 150–400)
RBC: 4.89 MIL/uL (ref 3.87–5.11)
RDW: 13.2 % (ref 11.5–15.5)
WBC: 6.5 10*3/uL (ref 4.0–10.5)
nRBC: 0 % (ref 0.0–0.2)

## 2020-09-26 LAB — I-STAT BETA HCG BLOOD, ED (MC, WL, AP ONLY): I-stat hCG, quantitative: 6.6 m[IU]/mL — ABNORMAL HIGH (ref <5)

## 2020-09-26 LAB — LIPASE, BLOOD: Lipase: 51 U/L (ref 11–51)

## 2020-09-26 MED ORDER — ONDANSETRON 4 MG PO TBDP: 4.0000 mg | ORAL_TABLET | Freq: Three times a day (TID) | ORAL | 0 refills | Status: DC | PRN

## 2020-09-26 MED ORDER — ONDANSETRON 4 MG PO TBDP
4.0000 mg | ORAL_TABLET | Freq: Once | ORAL | Status: AC
  Administered 2020-09-26: 4 mg via ORAL
  Filled 2020-09-26: qty 1

## 2020-09-26 MED ORDER — SUCRALFATE 1 G PO TABS: 1.0000 g | ORAL_TABLET | Freq: Three times a day (TID) | ORAL | 0 refills | Status: DC

## 2020-09-26 MED ORDER — OMEPRAZOLE 20 MG PO CPDR: 20.0000 mg | DELAYED_RELEASE_CAPSULE | Freq: Two times a day (BID) | ORAL | 0 refills | Status: DC

## 2020-09-26 MED ORDER — ALUM & MAG HYDROXIDE-SIMETH 200-200-20 MG/5ML PO SUSP
30.0000 mL | Freq: Once | ORAL | Status: AC
  Administered 2020-09-26: 30 mL via ORAL
  Filled 2020-09-26: qty 30

## 2020-09-26 MED ORDER — PANTOPRAZOLE SODIUM 40 MG PO TBEC
40.0000 mg | DELAYED_RELEASE_TABLET | Freq: Every day | ORAL | Status: DC
  Administered 2020-09-26: 40 mg via ORAL
  Filled 2020-09-26: qty 1

## 2020-09-26 NOTE — ED Provider Notes (Signed)
Emergency Medicine Provider Triage Evaluation Note  Bailey Patterson , a 60 y.o. female  was evaluated in triage.  Pt complains of abdominal pain.  Pain present primarily in the epigastric region as well as some lower abdominal pain.  Pain has been present and worsening since Sunday.  Reports associated nausea and poor appetite but no vomiting.  No blood in the stool.  No fevers.  No chest pain or shortness of breath.  Does report recently using a lot of Goody powder and Excedrin for headache.  Review of Systems  Positive: Abdominal pain, nausea Negative: Fever, chest pain, shortness of breath, blood in stool  Physical Exam  BP 140/69   Pulse 80   Temp 98.1 F (36.7 C) (Oral)   Resp 18   SpO2 99%  Gen:   Awake, no distress   Resp:  Normal effort  MSK:   Moves extremities without difficulty  Other:  Abdomen with generalized tenderness that does not localize to 1 area, no peritoneal signs  Medical Decision Making  Medically screening exam initiated at 4:23 PM.  Appropriate orders placed.  Bailey Patterson was informed that the remainder of the evaluation will be completed by another provider, this initial triage assessment does not replace that evaluation, and the importance of remaining in the ED until their evaluation is complete.     Dartha Lodge, PA-C 09/26/20 1625    Little, Ambrose Finland, MD 09/27/20 1515

## 2020-09-26 NOTE — Discharge Instructions (Signed)
Please read the attachment on gastritis.  I would like for you to take the Prilosec 30 minutes before meals, as directed.  I would also like you to take Maalox over-the-counter as needed for pain symptoms.  Please do not continue to take NSAIDs as this can worsen your symptoms.  I have also prescribed you Zofran which can take as needed for nausea symptoms, however I suspect that it will improve with proton pump inhibitors (omeprazole) and Carafate.  I cannot emphasize enough the importance of outpatient follow-up with a primary care provider.  Please call the [facility] to get established with a PCP.  Return to the ER or seek immediate medical attention should you experience any new or worsening symptoms.

## 2020-09-26 NOTE — ED Provider Notes (Signed)
[facility]-EMERGENCY DEPT Provider Note   CSN: [id number] Arrival date & time: 09/26/20  1553     History Chief Complaint  Patient presents with  . Abdominal Pain  . Nausea    Bailey Patterson is a 60 y.o. female with PMH of anxiety and fibromyalgia who presents to the ED with a 4-day history of epigastric abdominal pain.  I reviewed her medical record and she was seen on 09/22/2020 for migraine headache and also has been complaining of a finger injury.  She was treated with steroids and Toradol, discharged home with continued naproxen.  On my examination, patient reports that she has been taking a lot of Excedrin for her typical migraine headaches.  She states that she has been having coffee for the past year and sometimes she needs caffeine in order to treat her headaches.  She describes more of a vascular headache.  She denies any meningismus, focal deficits, numbness or weakness, gait disturbance, or any other symptoms.  She had seen a neurologist in the past and has been prescribed Imitrex, but she states that stopped working.  Over the course of the past few days she has been experiencing progressively worsening epigastric abdominal pain described as "sharp".  She states that it will occasionally radiate to her back.  She describes it as constant, no obvious aggravating or alleviating factors.  Patient describes associated nausea symptoms  She does not drink alcohol, smoke tobacco, or participate in any other illicit drug use.  Specifically, no IVDA.  She denies any recent traumas or injury, fevers or chills, chest pain or shortness of breath, cough, emesis, dysuria, increased urinary frequency, vaginal bleeding, unusual vaginal discharge, recent intercourse, melena, hematochezia, or other changes in her bowel habits.  Most recent bowel movement this morning, soft brown.  HPI     Past Medical History:  Diagnosis Date  . ANXIETY 11/16/2009  . CHEST WALL PAIN,  ACUTE 04/16/2010  . Constipation   . DVT (deep venous thrombosis) (HCC)   . FATIGUE 03/28/2009  . Fibromyalgia   . Hernia   . KNEE PAIN 02/04/2010  . MENTAL CONFUSION 11/16/2009  . MIGRAINE, COMMON W/INTRACTABLE MIGRAINE 03/28/2009  . Nausea alone 03/28/2009  . Phlebitis, complicating pregnancy or puerperium   . Presence of IVC filter   . Vertigo     Patient Active Problem List   Diagnosis Date Noted  . Encounter for screening for COVID-19 03/06/2020  . 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 History of COVID-19 02/02/2020  . Cough 02/02/2020  . Acute bilateral low back pain without sciatica 02/02/2020  . Chronic migraine without aura, with intractable migraine, so stated, with status migrainosus 07/16/2017  . Hematochezia 08/11/2012  . Rectal pain, chronic 08/11/2012  . Unspecified constipation 08/11/2012  . Migraine headache 12/20/2010  . Paresthesia 12/20/2010  . CHEST WALL PAIN, ACUTE 04/16/2010  . KNEE PAIN 02/04/2010  . MENTAL CONFUSION 11/16/2009  . ANXIETY 11/16/2009  . FATIGUE 03/28/2009  . Nausea alone 03/28/2009    Past Surgical History:  Procedure Laterality Date  . CESAREAN SECTION    . ENDOMETRIAL ABLATION    . HERNIA REPAIR       OB History   No obstetric history on file.     Family History  Problem Relation Age of Onset  . Diabetes Mother   . Heart disease Mother   . Kidney disease Mother   . Hypertension Mother   . Stroke Mother   . Cervical cancer Mother   . Diabetes  Other   . Hypertension Other   . Kidney disease Other   . Cancer Father     Social History   Tobacco Use  . Smoking status: Never Smoker  . Smokeless tobacco: Never Used  Vaping Use  . Vaping Use: Never used  Substance Use Topics  . Alcohol use: No  . Drug use: No    Home Medications Prior to Admission medications   Medication Sig Start Date End Date Taking? Authorizing Provider  omeprazole (PRILOSEC) 20 MG capsule Take 1 capsule (20 mg total) by mouth 2 (two) times  daily before a meal. 09/26/20 10/26/20 Yes Ayris Carano, Sharion SettlerGarrett L, PA-C  ondansetron (ZOFRAN ODT) 4 MG disintegrating tablet Take 1 tablet (4 mg total) by mouth every 8 (eight) hours as needed for nausea or vomiting. 09/26/20  Yes Lorelee NewGreen, Milano Rosevear L, PA-C  sucralfate (CARAFATE) 1 g tablet Take 1 tablet (1 g total) by mouth 3 (three) times daily with meals for 10 days. 09/26/20 10/06/20 Yes Lorelee NewGreen, Fahmida Jurich L, PA-C  Albuterol Sulfate (PROAIR RESPICLICK) 108 (90 Base) MCG/ACT AEPB Inhale 2 puffs into the lungs 4 (four) times daily as needed. 02/14/20   Martina Sinnerewald, Jonathan B, MD  aspirin-acetaminophen-caffeine (EXCEDRIN MIGRAINE) 5176782194250-250-65 MG tablet Take 2 tablets by mouth every 6 (six) hours as needed for headache.    [provider]  azithromycin (ZITHROMAX) 250 MG tablet TAKE 2 TABLETS (500 MG) ON DAY 1, THEN TAKE 1 TABLET (250 MG) ON DAYS 2-5 03/06/20 03/06/21  Ivonne AndrewNichols, Tonya S, NP  erythromycin ophthalmic ointment Place a 1/2 inch ribbon of ointment into the lower eyelid 4 times daily x 1 week 09/22/20   Wieters, Hallie C, PA-C  fluticasone (FLONASE) 50 MCG/ACT nasal spray Place 1 spray into both nostrils daily. 02/14/20   Martina Sinnerewald, Jonathan B, MD  fluticasone (FLOVENT HFA) 220 MCG/ACT inhaler Inhale 2 puffs into the lungs in the morning and at bedtime. 02/14/20   Martina Sinnerewald, Jonathan B, MD  naproxen (NAPROSYN) 500 MG tablet Take 1 tablet (500 mg total) by mouth 2 (two) times daily as needed for headache. 09/22/20   Wieters, Hallie C, PA-C  predniSONE (DELTASONE) 10 MG tablet TAKE 4 TABS FOR 2 DAYS, THEN 3 TABS FOR 2 DAYS, THEN 2 TABS FOR 2 DAYS, THEN 1 TAB FOR 2 DAYS, THEN STOP 03/06/20 03/06/21  Ivonne AndrewNichols, Tonya S, NP  Vitamin D, Ergocalciferol, (DRISDOL) 50000 units CAPS capsule Take 1 capsule by mouth once a week.  06/18/17   [provider]    Allergies    Blueberry [vaccinium angustifolium], Grapeseed extract [nutritional supplements], Lyrica [pregabalin], Oxycodone, Topiramate, and  Hydrocodone-acetaminophen  Review of Systems   Review of Systems  All other systems reviewed and are negative.   Physical Exam Updated Vital Signs BP 128/78   Pulse 74   Temp 98.1 F (36.7 C) (Oral)   Resp 16   SpO2 100%   Physical Exam Vitals and nursing note reviewed. Exam conducted with a chaperone present.  Constitutional:      General: She is not in acute distress.    Appearance: Normal appearance. She is not ill-appearing.  HENT:     Head: Normocephalic and atraumatic.  Eyes:     General: No scleral icterus.    Conjunctiva/sclera: Conjunctivae normal.  Cardiovascular:     Rate and Rhythm: Normal rate.     Pulses: Normal pulses.  Pulmonary:     Effort: Pulmonary effort is normal. No respiratory distress.     Comments: CTA bilaterally.  No increased  work of breathing. Abdominal:     General: Abdomen is flat. There is no distension.     Palpations: Abdomen is soft. There is no mass.     Tenderness: There is no abdominal tenderness. There is no right CVA tenderness, left CVA tenderness or guarding.     Comments: Soft, nondistended.  No abdominal tenderness.  No guarding.  No peritoneal signs.  Musculoskeletal:        General: Normal range of motion.     Cervical back: Normal range of motion. No rigidity.     Comments: No midline spinal tenderness to palpation.  No overlying skin changes. Moves all extremities with good strength intact against resistance.  Ambulatory.  Skin:    General: Skin is dry.  Neurological:     General: No focal deficit present.     Mental Status: She is alert and oriented to person, place, and time.     GCS: GCS eye subscore is 4. GCS verbal subscore is 5. GCS motor subscore is 6.  Psychiatric:        Mood and Affect: Mood normal.        Behavior: Behavior normal.        Thought Content: Thought content normal.     ED Results / Procedures / Treatments   Labs (all labs ordered are listed, but only abnormal results are displayed) Labs  Reviewed  URINALYSIS, ROUTINE W REFLEX MICROSCOPIC - Abnormal; Notable for the following components:      Result Value   APPearance HAZY (*)    Ketones, ur 5 (*)    All other components within normal limits  I-STAT BETA HCG BLOOD, ED ([facility], [facility], [facility] ONLY) - Abnormal; Notable for the following components:   I-stat hCG, quantitative 6.6 (*)    All other components within normal limits  COMPREHENSIVE METABOLIC PANEL  LIPASE, BLOOD  CBC WITH DIFFERENTIAL/PLATELET    EKG None  Radiology No results found.  Procedures Procedures   Medications Ordered in ED Medications  pantoprazole (PROTONIX) EC tablet 40 mg (has no administration in time range)  alum & mag hydroxide-simeth (MAALOX/MYLANTA) 200-200-20 MG/5ML suspension 30 mL (has no administration in time range)  ondansetron (ZOFRAN-ODT) disintegrating tablet 4 mg (has no administration in time range)    ED Course  I have reviewed the triage vital signs and the nursing notes.  Pertinent labs & imaging results that were available during my care of the patient were reviewed by me and considered in my medical decision making (see chart for details).    MDM Rules/Calculators/A&P                          Bailey Patterson was evaluated in Emergency Department on 09/26/2020 for the symptoms described in the history of present illness. She was evaluated in the context of the global COVID-19 pandemic, which necessitated consideration that the patient might be at risk for infection with the SARS-CoV-2 virus that causes COVID-19. Institutional protocols and algorithms that pertain to the evaluation of patients at risk for COVID-19 are in a state of rapid change based on information released by regulatory bodies including the CDC and federal and state organizations. These policies and algorithms were followed during the patient's care in the ED.  I personally reviewed patient's medical chart and all notes from triage and staff during today's  encounter. I have also ordered and reviewed all labs and imaging that I felt to be medically necessary in the  evaluation of this patient's complaints and with consideration of their physical exam. If needed, translation services were available and utilized.   Patient with a 4-day history of epigastric abdominal discomfort in context of significant recent NSAID use for treatment of her headache disorder.  Suspect vascular headaches rather than migraines given that they improved with caffeine, which is why she has preferred using Excedrin migraine.  She denies any melena and her hemoglobin is stable.  Low suspicion for bleeding gastric ulcer.  However, given nausea symptoms, do feel as though this is likely NSAID induced gastritis.  There are no peritoneal signs and her abdominal exam is entirely benign.  I reassessed abdomen on multiple occasions, no focal TTP or guarding.  I do not feel CT imaging is warranted.  She is resting comfortably no acute distress.  She has a history of fibromyalgia, but states that has been largely controlled.  She thinks that perhaps her low back discomfort is related to a pinched nerve subsequent to epidural performed for one of her C-sections.  She is neurovascular intact ambulatory.  No history of physical exam findings otherwise concerning for cord compression or cauda equina.  ER return precautions discussed.  Patient voices understanding and is agreeable to the plan.  Final Clinical Impression(s) / ED Diagnoses Final diagnoses:  Acute gastritis without hemorrhage, unspecified gastritis type    Rx / DC Orders ED Discharge Orders         Ordered    omeprazole (PRILOSEC) 20 MG capsule  2 times daily before meals        09/26/20 1818    sucralfate (CARAFATE) 1 g tablet  3 times daily with meals        09/26/20 1818    ondansetron (ZOFRAN ODT) 4 MG disintegrating tablet  Every 8 hours PRN        09/26/20 1818           Lorelee New, PA-C 09/26/20  1820    Bethann Berkshire, MD 09/28/20 1549

## 2020-09-26 NOTE — ED Triage Notes (Signed)
Pt c/o abdominal pain and nausea for last few days, worsening since last night. Pt states she has been taking Goodie powder and excedrin for her migraines.

## 2020-11-06 ENCOUNTER — Ambulatory Visit
Admission: EM | Admit: 2020-11-06 | Discharge: 2020-11-06 | Disposition: A | Discharge status: Home / Self Care | Payer: 59 | Attending: Emergency Medicine | Admitting: Emergency Medicine

## 2020-11-06 ENCOUNTER — Encounter: Payer: Self-pay | Admitting: Emergency Medicine

## 2020-11-06 ENCOUNTER — Other Ambulatory Visit: Payer: Self-pay

## 2020-11-06 DIAGNOSIS — M791 Myalgia, unspecified site: Secondary | ICD-10-CM | POA: Diagnosis not present

## 2020-11-06 MED ORDER — METHOCARBAMOL 500 MG PO TABS: 500.0000 mg | ORAL_TABLET | Freq: Two times a day (BID) | ORAL | 0 refills | Status: DC

## 2020-11-06 MED ORDER — PREDNISONE 10 MG (21) PO TBPK: ORAL_TABLET | Freq: Every day | ORAL | 0 refills | Status: DC

## 2020-11-06 NOTE — ED Triage Notes (Signed)
Pt sts woke this am with muscle spasm in neck and right shoulder; denies injury

## 2020-11-06 NOTE — ED Provider Notes (Signed)
[name]    CSN: [id number] Arrival date & time: 11/06/20  9622      History   Chief Complaint Chief Complaint  Patient presents with   Neck Pain   Shoulder Pain    HPI Bailey Patterson is a 60 y.o. female.   Pt has fibromyalgia and states that she took herself off medications 2 years ago. This am awaken with next muscle pain and it is not any better. Use to take muscle relaxer for this, but does not have anymore. Denies any chest pain, no sob, no injury. Has not taken anything pta.    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   History of COVID-19 02/02/2020   Cough 02/02/2020   Acute bilateral low back pain without sciatica 02/02/2020   Chronic migraine without aura, with intractable migraine, so stated, with status migrainosus 07/16/2017   Hematochezia 08/11/2012   Rectal pain, chronic 08/11/2012   Unspecified constipation 08/11/2012   Migraine headache 12/20/2010   Paresthesia 12/20/2010   CHEST WALL PAIN, ACUTE 04/16/2010   KNEE PAIN 02/04/2010   MENTAL CONFUSION 11/16/2009   ANXIETY 11/16/2009   FATIGUE 03/28/2009   Nausea alone 03/28/2009    Past Surgical History:  Procedure Laterality Date   CESAREAN SECTION     ENDOMETRIAL ABLATION     HERNIA REPAIR      OB History   No obstetric history on file.      Home Medications    Prior to Admission medications   Medication Sig Start Date End Date Taking? Authorizing Provider   methocarbamol (ROBAXIN) 500 MG tablet Take 1 tablet (500 mg total) by mouth 2 (two) times daily. 11/06/20  Yes Coralyn Mark, NP  predniSONE (STERAPRED UNI-PAK 21 TAB) 10 MG (21) TBPK tablet Take by mouth daily. Take 6 tabs by mouth daily  for 2 days, then 5 tabs for 2 days, then 4 tabs for 2 days, then 3 tabs for 2 days, 2 tabs for 2 days, then 1 tab by mouth daily for 2 days 11/06/20  Yes Coralyn Mark, NP  Albuterol Sulfate (PROAIR RESPICLICK) 108 (90 Base) MCG/ACT AEPB Inhale 2 puffs into the lungs 4 (four) times daily as needed. 02/14/20   Martina Sinner, MD  aspirin-acetaminophen-caffeine (EXCEDRIN MIGRAINE) 210-199-0578 MG tablet Take 2 tablets by mouth every 6 (six) hours as needed for headache.    [provider]  azithromycin (ZITHROMAX) 250 MG tablet TAKE 2 TABLETS (500 MG) ON DAY 1, THEN TAKE 1 TABLET (250 MG) ON DAYS 2-5 Patient not taking: Reported on 11/06/2020 03/06/20 03/06/21  Ivonne Andrew, NP  erythromycin ophthalmic ointment Place a 1/2 inch ribbon of ointment into the lower eyelid 4 times daily x 1 week Patient not taking: Reported on 11/06/2020 09/22/20   Wieters, Hallie C, PA-C  fluticasone (FLONASE) 50 MCG/ACT nasal spray Place 1 spray into both nostrils daily. 02/14/20  Martina Sinner, MD  fluticasone (FLOVENT HFA) 220 MCG/ACT inhaler Inhale 2 puffs into the lungs in the morning and at bedtime. 02/14/20   Martina Sinner, MD  naproxen (NAPROSYN) 500 MG tablet Take 1 tablet (500 mg total) by mouth 2 (two) times daily as needed for headache. 09/22/20   Wieters, Fran Lowes C, PA-C  omeprazole (PRILOSEC) 20 MG capsule Take 1 capsule (20 mg total) by mouth 2 (two) times daily before a meal. 09/26/20 10/26/20  Chilton Si, Sharion Settler, PA-C  ondansetron (ZOFRAN ODT) 4 MG disintegrating tablet Take 1 tablet (4 mg total) by mouth every 8 (eight) hours as needed for nausea or vomiting. 09/26/20   Lorelee New, PA-C  sucralfate (CARAFATE) 1 g tablet Take 1 tablet (1 g  total) by mouth 3 (three) times daily with meals for 10 days. Patient not taking: Reported on 11/06/2020 09/26/20 10/06/20  Lorelee New, PA-C  Vitamin D, Ergocalciferol, (DRISDOL) 50000 units CAPS capsule Take 1 capsule by mouth once a week.  06/18/17   [provider]    Family History Family History  Problem Relation Age of Onset   Diabetes Mother    Heart disease Mother    Kidney disease Mother    Hypertension Mother    Stroke Mother    Cervical cancer Mother    Diabetes Other    Hypertension Other    Kidney disease Other    Cancer Father     Social History Social History   Tobacco Use   Smoking status: Never   Smokeless tobacco: Never  Vaping Use   Vaping Use: Never used  Substance Use Topics   Alcohol use: No   Drug use: No     Allergies   Blueberry [vaccinium angustifolium], Grapeseed extract [nutritional supplements], Lyrica [pregabalin], Oxycodone, Topiramate, and Hydrocodone-acetaminophen   Review of Systems Review of Systems  Constitutional:  Negative for activity change and fever.  Eyes: Negative.   Respiratory: Negative.    Cardiovascular: Negative.   Gastrointestinal: Negative.   Musculoskeletal:  Positive for back pain and neck stiffness.  Skin: Negative.   Neurological: Negative.     Physical Exam Triage Vital Signs ED Triage Vitals [11/06/20 0829]  Enc Vitals Group     BP 110/61     Pulse Rate 98     Resp 18     Temp 98 F (36.7 C)     Temp Source Oral     SpO2 96 %     Weight      Height      Head Circumference      Peak Flow      Pain Score 8     Pain Loc      Pain Edu?      Excl. in GC?    No data found.  Updated Vital Signs BP 110/61 (BP Location: Left Arm)   Pulse 98   Temp 98 F (36.7 C) (Oral)   Resp 18   SpO2 96%   Visual Acuity     Physical Exam Constitutional:      Appearance: Normal appearance.  Cardiovascular:     Rate and Rhythm: Normal rate.  Pulmonary:     Effort: Pulmonary effort is  normal.  Abdominal:     General: Abdomen is flat.  Musculoskeletal:        General: Tenderness present. No swelling, deformity or signs of injury. Normal range of motion.     Cervical back: Normal range of motion. Tenderness present.  Comments: Denies midline pain , more upper shoulder muscle area. Full ROM   Skin:    General: Skin is warm.     Capillary Refill: Capillary refill takes less than 2 seconds.  Neurological:     Mental Status: She is alert.     UC Treatments / Results  Labs (all labs ordered are listed, but only abnormal results are displayed) Labs Reviewed - No data to display  EKG   Radiology No results found.  Procedures Procedures (including critical care time)  Medications Ordered in UC Medications - No data to display  Initial Impression / Assessment and Plan / UC Course  I have reviewed the triage vital signs and the nursing notes.  Pertinent labs & imaging results that were available during my care of the patient were reviewed by me and considered in my medical decision making (see chart for details).     Take meds as needed  Take nsaids prn  Will need to see ortho if pain persist or pcp  Final Clinical Impressions(s) / UC Diagnoses   Final diagnoses:  Muscle pain   Discharge Instructions   None    ED Prescriptions     Medication Sig Dispense Auth. Provider   predniSONE (STERAPRED UNI-PAK 21 TAB) 10 MG (21) TBPK tablet Take by mouth daily. Take 6 tabs by mouth daily  for 2 days, then 5 tabs for 2 days, then 4 tabs for 2 days, then 3 tabs for 2 days, 2 tabs for 2 days, then 1 tab by mouth daily for 2 days 42 tablet Maple Mirza L, NP   methocarbamol (ROBAXIN) 500 MG tablet Take 1 tablet (500 mg total) by mouth 2 (two) times daily. 20 tablet Coralyn Mark, NP      PDMP not reviewed this encounter.   Coralyn Mark, NP 11/06/20 [phone]

## 2020-11-07 ENCOUNTER — Emergency Department (HOSPITAL_COMMUNITY)
Admission: EM | Admit: 2020-11-07 | Discharge: 2020-11-07 | Disposition: A | Discharge status: Home / Self Care | Payer: 59 | Attending: Emergency Medicine | Admitting: Emergency Medicine

## 2020-11-07 ENCOUNTER — Encounter (HOSPITAL_COMMUNITY): Payer: Self-pay

## 2020-11-07 DIAGNOSIS — T50901A Poisoning by unspecified drugs, medicaments and biological substances, accidental (unintentional), initial encounter: Secondary | ICD-10-CM | POA: Diagnosis present

## 2020-11-07 DIAGNOSIS — Z8616 Personal history of COVID-19: Secondary | ICD-10-CM | POA: Insufficient documentation

## 2020-11-07 DIAGNOSIS — R110 Nausea: Secondary | ICD-10-CM | POA: Insufficient documentation

## 2020-11-07 MED ORDER — IBUPROFEN 800 MG PO TABS
800.0000 mg | ORAL_TABLET | Freq: Once | ORAL | Status: AC
  Administered 2020-11-07: 800 mg via ORAL
  Filled 2020-11-07: qty 1

## 2020-11-07 MED ORDER — ONDANSETRON 4 MG PO TBDP
4.0000 mg | ORAL_TABLET | Freq: Once | ORAL | Status: AC
  Administered 2020-11-07: 4 mg via ORAL
  Filled 2020-11-07: qty 1

## 2020-11-07 NOTE — Discharge Instructions (Addendum)
1. Medications: usual home medications -take as directed in read labels carefully. 2. Treatment: rest, drink plenty of fluids,  3. Follow Up: Please followup with your primary doctor in 1-2 days for discussion of your diagnoses and further evaluation after today's visit; if you do not have a primary care doctor use the resource guide provided to find one; Please return to the ER for excessive sleepiness, chest pain, shortness of breath, new or worsening symptoms.

## 2020-11-07 NOTE — ED Provider Notes (Signed)
Care issue moved from [facility] at shift change.  Please see her note for more detailed history.  Patient accidentally took 6 methocarbamol instead of 6 prednisone this morning.  This is not intentional OD, but she was concerned about ingestion.  She is not currently having any CNS symptoms.  Poison control advised to observe for 6 hours.   Physical Exam  BP 138/75   Pulse 75   Temp 97.7 F (36.5 C) (Oral)   Resp 15   SpO2 96%   Physical Exam Vitals and nursing note reviewed. Exam conducted with a chaperone present.  Constitutional:      General: She is not in acute distress.    Appearance: Normal appearance.  HENT:     Head: Normocephalic and atraumatic.  Eyes:     General: No scleral icterus.    Extraocular Movements: Extraocular movements intact.     Pupils: Pupils are equal, round, and reactive to light.  Skin:    Coloration: Skin is not jaundiced.  Neurological:     General: No focal deficit present.     Mental Status: She is alert and oriented to person, place, and time. Mental status is at baseline.     Cranial Nerves: No cranial nerve deficit.     Coordination: Coordination normal.     Comments: Cranial nerves III through XII grossly intact.  Grip strength equal bilaterally. Strength 5/5 UE and LE. Sensation to light touch grossly in tact.     ED Course/Procedures     Procedures  MDM  Reevaluation at 8 AM.  Patient denies any changes in cognition, fatigue, lightheadedness, numbness, tingling, ill feelings.  No focal deficits, EKG normal.  Vitals are stable, patient is nontoxic in appearance.  Patient is appropriate for discharge at this time.  Return precautions given.  Patient is agreeable to plan and voiced understanding.       Theron Arista, PA-C 11/07/20 2633    Alvira Monday, MD 11/08/20 [phone]

## 2020-11-07 NOTE — ED Triage Notes (Signed)
Patient arrived stating she took the wrong medication at 215am. States she meant to take her prednisone and took her 6 of her methocarbamol 500 mg tablets instead.

## 2020-11-07 NOTE — ED Provider Notes (Signed)
[facility]-EMERGENCY DEPT Provider Note   CSN: [id number] Arrival date & time: 11/07/20  0335     History Chief Complaint  Patient presents with   Ingestion    Bailey Patterson is a 60 y.o. female presents to the Emergency Department complaining of accidental overdose.  Patient reports that she was seen at urgent care yesterday for muscle spasm of her right trapezius.  She was prescribed methocarbamol 500 mg and prednisone 10 mg.  She awoke at 2:10 AM with muscle spasm.  She took what she thought was her prednisone but instead was her Robaxin.  Patient reports she took 6 tablets of methocarbamol and 2 tablets of prednisone thinking the prescriptions were reversed.  Reports she feels sleepy at this time but no other adverse events.  Reports she called poison control who sent her here to the emergency department.  Patient denies suicidal or homicidal ideation.  She is adamant that overdose was accidental.  She has associated nausea but no vomiting.  The history is provided by the patient and medical records. No language interpreter was used.      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   History of COVID-19 02/02/2020   Cough 02/02/2020   Acute bilateral low back pain without sciatica 02/02/2020   Chronic migraine without aura, with intractable migraine, so stated, with status migrainosus 07/16/2017   Hematochezia 08/11/2012   Rectal pain, chronic  08/11/2012   Unspecified constipation 08/11/2012   Migraine headache 12/20/2010   Paresthesia 12/20/2010   CHEST WALL PAIN, ACUTE 04/16/2010   KNEE PAIN 02/04/2010   MENTAL CONFUSION 11/16/2009   ANXIETY 11/16/2009   FATIGUE 03/28/2009   Nausea alone 03/28/2009    Past Surgical History:  Procedure Laterality Date   CESAREAN SECTION     ENDOMETRIAL ABLATION     HERNIA REPAIR       OB History   No obstetric history on file.     Family History  Problem Relation Age of Onset   Diabetes Mother    Heart disease Mother    Kidney disease Mother    Hypertension Mother    Stroke Mother    Cervical cancer Mother    Diabetes Other    Hypertension Other    Kidney disease Other    Cancer Father     Social History   Tobacco Use   Smoking status: Never   Smokeless tobacco: Never  Vaping Use   Vaping Use: Never used  Substance Use Topics   Alcohol use: No   Drug use: No    Home Medications Prior to Admission medications   Medication Sig Start Date End Date Taking? Authorizing Provider  Albuterol Sulfate (PROAIR RESPICLICK) 108 (90 Base) MCG/ACT AEPB Inhale 2 puffs into the lungs 4 (four) times daily as needed. 02/14/20   Martina Sinner, MD  aspirin-acetaminophen-caffeine (EXCEDRIN MIGRAINE) [phone] MG tablet Take 2 tablets by mouth  every 6 (six) hours as needed for headache.    [provider]  azithromycin (ZITHROMAX) 250 MG tablet TAKE 2 TABLETS (500 MG) ON DAY 1, THEN TAKE 1 TABLET (250 MG) ON DAYS 2-5 Patient not taking: Reported on 11/06/2020 03/06/20 03/06/21  Ivonne Andrew, NP  erythromycin ophthalmic ointment Place a 1/2 inch ribbon of ointment into the lower eyelid 4 times daily x 1 week Patient not taking: Reported on 11/06/2020 09/22/20   Wieters, Hallie C, PA-C  fluticasone (FLONASE) 50 MCG/ACT nasal spray Place 1 spray into both nostrils daily. 02/14/20   Martina Sinner, MD  fluticasone (FLOVENT HFA) 220 MCG/ACT inhaler Inhale 2 puffs into the  lungs in the morning and at bedtime. 02/14/20   Martina Sinner, MD  methocarbamol (ROBAXIN) 500 MG tablet Take 1 tablet (500 mg total) by mouth 2 (two) times daily. 11/06/20   Coralyn Mark, NP  naproxen (NAPROSYN) 500 MG tablet Take 1 tablet (500 mg total) by mouth 2 (two) times daily as needed for headache. 09/22/20   Wieters, Fran Lowes C, PA-C  omeprazole (PRILOSEC) 20 MG capsule Take 1 capsule (20 mg total) by mouth 2 (two) times daily before a meal. 09/26/20 10/26/20  Chilton Si, Sharion Settler, PA-C  ondansetron (ZOFRAN ODT) 4 MG disintegrating tablet Take 1 tablet (4 mg total) by mouth every 8 (eight) hours as needed for nausea or vomiting. 09/26/20   Lorelee New, PA-C  predniSONE (STERAPRED UNI-PAK 21 TAB) 10 MG (21) TBPK tablet Take by mouth daily. Take 6 tabs by mouth daily  for 2 days, then 5 tabs for 2 days, then 4 tabs for 2 days, then 3 tabs for 2 days, 2 tabs for 2 days, then 1 tab by mouth daily for 2 days 11/06/20   Coralyn Mark, NP  sucralfate (CARAFATE) 1 g tablet Take 1 tablet (1 g total) by mouth 3 (three) times daily with meals for 10 days. Patient not taking: Reported on 11/06/2020 09/26/20 10/06/20  Lorelee New, PA-C  Vitamin D, Ergocalciferol, (DRISDOL) 50000 units CAPS capsule Take 1 capsule by mouth once a week.  06/18/17   [provider]    Allergies    Blueberry [vaccinium angustifolium], Grapeseed extract [nutritional supplements], Lyrica [pregabalin], Oxycodone, Topiramate, and Hydrocodone-acetaminophen  Review of Systems   Review of Systems  Constitutional:  Positive for fatigue. Negative for appetite change, diaphoresis, fever and unexpected weight change.  HENT:  Negative for mouth sores.   Eyes:  Negative for visual disturbance.  Respiratory:  Negative for cough, chest tightness, shortness of breath and wheezing.   Cardiovascular:  Negative for chest pain.  Gastrointestinal:  Negative for abdominal pain, constipation, diarrhea, nausea and vomiting.   Endocrine: Negative for polydipsia, polyphagia and polyuria.  Genitourinary:  Negative for dysuria, frequency, hematuria and urgency.  Musculoskeletal:  Positive for myalgias. Negative for back pain and neck stiffness.  Skin:  Negative for rash.  Allergic/Immunologic: Negative for immunocompromised state.  Neurological:  Negative for syncope, light-headedness and headaches.  Hematological:  Does not bruise/bleed easily.  Psychiatric/Behavioral:  Negative for sleep disturbance. The patient is not nervous/anxious.    Physical Exam Updated Vital Signs BP (!) 169/90 (BP Location: Left Arm)   Pulse 95   Temp 97.7 F (36.5 C) (Oral)   Resp 13   SpO2 100%   Physical Exam Vitals and nursing note reviewed.  Constitutional:      General: She is not in acute distress.    Appearance: She is  not diaphoretic.  HENT:     Head: Normocephalic.  Eyes:     General: No scleral icterus.    Conjunctiva/sclera: Conjunctivae normal.  Cardiovascular:     Rate and Rhythm: Normal rate and regular rhythm.     Pulses: Normal pulses.          Radial pulses are 2+ on the right side and 2+ on the left side.  Pulmonary:     Effort: No tachypnea, accessory muscle usage, prolonged expiration, respiratory distress or retractions.     Breath sounds: No stridor.     Comments: Equal chest rise. No increased work of breathing. Abdominal:     General: There is no distension.     Palpations: Abdomen is soft.     Tenderness: There is no abdominal tenderness. There is no guarding or rebound.  Musculoskeletal:     Cervical back: Normal range of motion.     Comments: Moves all extremities equally and without difficulty. Mild TTP along the right trapezius  Skin:    General: Skin is warm and dry.     Capillary Refill: Capillary refill takes less than 2 seconds.  Neurological:     Mental Status: She is alert.     GCS: GCS eye subscore is 4. GCS verbal subscore is 5. GCS motor subscore is 6.     Comments: Speech  is clear and goal oriented.  Psychiatric:        Mood and Affect: Mood normal.    ED Results / Procedures / Treatments   Labs (all labs ordered are listed, but only abnormal results are displayed) Labs Reviewed - No data to display  EKG EKG Interpretation  Date/Time:  Wednesday November 07 2020 03:52:45 EDT Ventricular Rate:  85 PR Interval:  146 QRS Duration: 76 QT Interval:  362 QTC Calculation: 430 R Axis:   65 Text Interpretation: Normal sinus rhythm Normal ECG Confirmed by Kennis Carina [phone]) on 11/07/2020 5:26:12 AM        Procedures Procedures   Medications Ordered in ED Medications  ondansetron (ZOFRAN-ODT) disintegrating tablet 4 mg (4 mg Oral Given 11/07/20 0451)  ibuprofen (ADVIL) tablet 800 mg (800 mg Oral Given 11/07/20 0503)    ED Course  I have reviewed the triage vital signs and the nursing notes.  Pertinent labs & imaging results that were available during my care of the patient were reviewed by me and considered in my medical decision making (see chart for details).    MDM Rules/Calculators/A&P                          Patient presents with accidental overdose.  Poison control recommends 6-hour observation on cardiac monitor.  Will obtain EKG and monitor.  Zofran given for nausea.  6:00 AM Patient continues to be well-appearing.  She will need to be monitored until at least 8 AM.  She will be signed out to the oncoming staff who will continue to monitor and discharge around 8 AM if patient remains stable.   Final Clinical Impression(s) / ED Diagnoses Final diagnoses:  Accidental drug ingestion, initial encounter    Rx / DC Orders ED Discharge Orders     None        Aubery Date, Boyd Kerbs 11/07/20 0602    Sabas Sous, MD 11/07/20 [phone]

## 2020-11-07 NOTE — ED Notes (Signed)
Pt provided with crackers and water.

## 2020-12-05 ENCOUNTER — Ambulatory Visit
Admission: RE | Admit: 2020-12-05 | Discharge: 2020-12-05 | Disposition: A | Discharge status: Home / Self Care | Payer: 59 | Source: Ambulatory Visit | Attending: Nurse Practitioner | Admitting: Nurse Practitioner

## 2020-12-05 VITALS — BP 136/87 | HR 86 | Temp 97.3°F | Resp 16

## 2020-12-05 DIAGNOSIS — J309 Allergic rhinitis, unspecified: Secondary | ICD-10-CM | POA: Diagnosis not present

## 2020-12-05 MED ORDER — PREDNISONE 10 MG (21) PO TBPK: ORAL_TABLET | Freq: Every day | ORAL | 0 refills | Status: DC

## 2020-12-05 MED ORDER — CETIRIZINE HCL 10 MG PO TABS: 10.0000 mg | ORAL_TABLET | Freq: Every day | ORAL | 0 refills | Status: DC

## 2020-12-05 MED ORDER — PSEUDOEPHEDRINE HCL ER 120 MG PO TB12: 120.0000 mg | ORAL_TABLET | Freq: Two times a day (BID) | ORAL | 0 refills | Status: AC

## 2020-12-05 NOTE — Discharge Instructions (Addendum)
Take medications as prescribed  Also take your Flonase daily  Drink plenty of fluids to stay hydrated

## 2020-12-05 NOTE — ED Provider Notes (Signed)
[name]    CSN: [id number] Arrival date & time: 12/05/20  1345      History   Chief Complaint Chief Complaint  Patient presents with   Facial Pain   Sore Throat    HPI Bailey Patterson is a 60 y.o. female.   Subjective:   Bailey Patterson is a 60 y.o. female who presents for evaluation of possible sinus infection. Symptoms include congestion, nasal congestion, post nasal drip, sneezing, and sore throat with no fever, chills, night sweats, cough or weight loss. Onset of symptoms was 4 days ago and has been stable since that time. She reports a history of COVID sometime in 2021. No COVID vaccine. Had negative COVID test at [facility] on 12/03/20. She has not tried anything for her symptoms. She is drinking plenty of fluids.  Past history is significant for no history of pneumonia or bronchitis. Patient is a non-smoker.  The following portions of the patient's history were reviewed and updated as appropriate: allergies, current medications, past family history, past medical history, past social history, past surgical history, and problem list.       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   History of COVID-19 02/02/2020   Cough 02/02/2020   Acute bilateral low back pain without sciatica 02/02/2020   Chronic migraine without aura, with intractable migraine, so stated, with status migrainosus 07/16/2017   Hematochezia 08/11/2012   Rectal pain, chronic  08/11/2012   Unspecified constipation 08/11/2012   Migraine headache 12/20/2010   Paresthesia 12/20/2010   CHEST WALL PAIN, ACUTE 04/16/2010   KNEE PAIN 02/04/2010   MENTAL CONFUSION 11/16/2009   ANXIETY 11/16/2009   FATIGUE 03/28/2009   Nausea alone 03/28/2009    Past Surgical History:  Procedure Laterality Date   CESAREAN SECTION     ENDOMETRIAL ABLATION     HERNIA REPAIR      OB History   No obstetric history on file.      Home Medications    Prior to Admission medications   Medication Sig Start Date End Date Taking? Authorizing Provider  cetirizine (ZYRTEC) 10 MG tablet Take 1 tablet (10 mg total) by mouth daily. 12/05/20  Yes Milon Dethloff, Lelon Mast, FNP  predniSONE (STERAPRED UNI-PAK 21 TAB) 10 MG (21) TBPK tablet Take by mouth daily. Take 6 tabs by mouth daily  for 2 days, then 5 tabs for 2 days, then 4 tabs for 2 days, then 3 tabs for 2 days, 2 tabs for 2 days, then 1 tab by mouth daily for 2 days 12/05/20  Yes Lurline Idol, FNP  pseudoephedrine (SUDAFED) 120 MG 12 hr tablet Take 1 tablet (120 mg total) by mouth 2 (two) times daily for 7 days. 12/05/20 12/12/20 Yes Lurline Idol, FNP  Albuterol Sulfate (PROAIR RESPICLICK) 108 (90 Base) MCG/ACT AEPB Inhale 2 puffs into the lungs 4 (four) times daily as needed. 02/14/20   Martina Sinner, MD  aspirin-acetaminophen-caffeine (  EXCEDRIN MIGRAINE) 250-250-65 MG tablet Take 2 tablets by mouth every 6 (six) hours as needed for headache.    [provider]  fluticasone (FLONASE) 50 MCG/ACT nasal spray Place 1 spray into both nostrils daily. 02/14/20   Martina Sinner, MD  fluticasone (FLOVENT HFA) 220 MCG/ACT inhaler Inhale 2 puffs into the lungs in the morning and at bedtime. 02/14/20   Martina Sinner, MD  methocarbamol (ROBAXIN) 500 MG tablet Take 1 tablet (500 mg total) by mouth 2 (two) times daily. 11/06/20   Coralyn Mark, NP  naproxen (NAPROSYN) 500 MG tablet Take 1 tablet (500 mg total) by mouth 2 (two)  times daily as needed for headache. 09/22/20   Wieters, Fran Lowes C, PA-C  omeprazole (PRILOSEC) 20 MG capsule Take 1 capsule (20 mg total) by mouth 2 (two) times daily before a meal. 09/26/20 10/26/20  Chilton Si, Sharion Settler, PA-C  ondansetron (ZOFRAN ODT) 4 MG disintegrating tablet Take 1 tablet (4 mg total) by mouth every 8 (eight) hours as needed for nausea or vomiting. 09/26/20   Lorelee New, PA-C  sucralfate (CARAFATE) 1 g tablet Take 1 tablet (1 g total) by mouth 3 (three) times daily with meals for 10 days. Patient not taking: Reported on 11/06/2020 09/26/20 10/06/20  Lorelee New, PA-C  Vitamin D, Ergocalciferol, (DRISDOL) 50000 units CAPS capsule Take 1 capsule by mouth once a week.  06/18/17   [provider]    Family History Family History  Problem Relation Age of Onset   Diabetes Mother    Heart disease Mother    Kidney disease Mother    Hypertension Mother    Stroke Mother    Cervical cancer Mother    Diabetes Other    Hypertension Other    Kidney disease Other    Cancer Father     Social History Social History   Tobacco Use   Smoking status: Never   Smokeless tobacco: Never  Vaping Use   Vaping Use: Never used  Substance Use Topics   Alcohol use: No   Drug use: No     Allergies   Blueberry [vaccinium angustifolium], Grapeseed extract [nutritional supplements], Lyrica [pregabalin], Oxycodone, Topiramate, and Hydrocodone-acetaminophen   Review of Systems Review of Systems  Constitutional:  Negative for chills, fatigue and fever.  HENT:  Positive for congestion, postnasal drip, rhinorrhea, sneezing and sore throat.   Eyes: Negative.   Respiratory:  Negative for cough and shortness of breath.   Gastrointestinal:  Negative for diarrhea, nausea and vomiting.  Neurological:  Negative for headaches.  All other systems reviewed and are negative.   Physical Exam Triage Vital Signs ED Triage Vitals [12/05/20 1413]  Enc Vitals Group     BP 136/87     Pulse  Rate 86     Resp 16     Temp (!) 97.3 F (36.3 C)     Temp Source Oral     SpO2 95 %     Weight      Height      Head Circumference      Peak Flow      Pain Score 8     Pain Loc      Pain Edu?      Excl. in GC?    No data found.  Updated Vital Signs BP 136/87 (BP Location: Left Arm)   Pulse 86   Temp (!) 97.3 F (36.3 C) (Oral)   Resp 16   SpO2 95%  Visual Acuity Right Eye Distance:   Left Eye Distance:   Bilateral Distance:    Right Eye Near:   Left Eye Near:    Bilateral Near:     Physical Exam Vitals reviewed.  Constitutional:      General: She is not in acute distress.    Appearance: She is well-developed. She is not ill-appearing, toxic-appearing or diaphoretic.  HENT:     Head: Normocephalic.     Right Ear: Tympanic membrane and ear canal normal.     Left Ear: Tympanic membrane and ear canal normal.     Nose: No congestion.     Mouth/Throat:     Mouth: Mucous membranes are moist.     Pharynx: No pharyngeal swelling or posterior oropharyngeal erythema.  Eyes:     Conjunctiva/sclera: Conjunctivae normal.     Pupils: Pupils are equal, round, and reactive to light.  Cardiovascular:     Rate and Rhythm: Normal rate.     Heart sounds: Normal heart sounds.  Pulmonary:     Effort: Pulmonary effort is normal.     Breath sounds: Normal breath sounds.  Musculoskeletal:     Cervical back: Normal range of motion and neck supple.  Lymphadenopathy:     Cervical: No cervical adenopathy.  Skin:    General: Skin is warm and dry.  Neurological:     General: No focal deficit present.     Mental Status: She is alert and oriented to person, place, and time.  Psychiatric:        Mood and Affect: Mood normal.        Behavior: Behavior normal.     UC Treatments / Results  Labs (all labs ordered are listed, but only abnormal results are displayed) Labs Reviewed - No data to display  EKG   Radiology No results found.  Procedures Procedures (including  critical care time)  Medications Ordered in UC Medications - No data to display  Initial Impression / Assessment and Plan / UC Course  I have reviewed the triage vital signs and the nursing notes.  Pertinent labs & imaging results that were available during my care of the patient were reviewed by me and considered in my medical decision making (see chart for details).    60 yo female presenting with congestion, nasal congestion, post nasal drip, sneezing, and sore throat with no fever, chills, night sweats, cough or weight loss. Onset of symptoms was 4 days ago and has been stable since that time.  Patient is afebrile.  Nontoxic.  Vital signs stable.  COVID test done on 12/03/2020 is negative.  Patient requesting treatment and work note.  Today's evaluation has revealed no signs of a dangerous process. Discussed diagnosis with patient and/or guardian. Patient and/or guardian aware of their diagnosis, possible red flag symptoms to watch out for and need for close follow up. Patient and/or guardian understands verbal and written discharge instructions. Patient and/or guardian comfortable with plan and disposition.  Patient and/or guardian has a clear mental status at this time, good insight into illness (after discussion and teaching) and has clear judgment to make decisions regarding their care  This care was provided during an unprecedented National Emergency due to the Novel Coronavirus (COVID-19) pandemic. COVID-19 infections and transmission risks place heavy strains on healthcare resources.  As this pandemic evolves, our facility, providers, and staff strive to respond fluidly, to remain operational, and to provide care relative to available resources and information. Outcomes are unpredictable and treatments are  without well-defined guidelines. Further, the impact of COVID-19 on all aspects of urgent care, including the impact to patients seeking care for reasons other than COVID-19, is  unavoidable during this national emergency. At this time of the global pandemic, management of patients has significantly changed, even for non-COVID positive patients given high local and regional COVID volumes at this time requiring high healthcare system and resource utilization. The standard of care for management of both COVID suspected and non-COVID suspected patients continues to change rapidly at the local, regional, national, and global levels. This patient was worked up and treated to the best available but ever changing evidence and resources available at this current time.   Documentation was completed with the aid of voice recognition software. Transcription may contain typographical errors. Final Clinical Impressions(s) / UC Diagnoses   Final diagnoses:  Allergic rhinitis, unspecified seasonality, unspecified trigger     Discharge Instructions      Take medications as prescribed  Also take your Flonase daily  Drink plenty of fluids to stay hydrated       ED Prescriptions     Medication Sig Dispense Auth. Provider   predniSONE (STERAPRED UNI-PAK 21 TAB) 10 MG (21) TBPK tablet Take by mouth daily. Take 6 tabs by mouth daily  for 2 days, then 5 tabs for 2 days, then 4 tabs for 2 days, then 3 tabs for 2 days, 2 tabs for 2 days, then 1 tab by mouth daily for 2 days 42 tablet Jade Burright, Lelon MastSamantha, FNP   cetirizine (ZYRTEC) 10 MG tablet Take 1 tablet (10 mg total) by mouth daily. 14 tablet Lurline IdolMurrill, Tima Curet, FNP   pseudoephedrine (SUDAFED) 120 MG 12 hr tablet Take 1 tablet (120 mg total) by mouth 2 (two) times daily for 7 days. 14 tablet Lurline IdolMurrill, Javad Salva, FNP      PDMP not reviewed this encounter.   Lurline IdolMurrill, Valta Dillon, [place]NP 12/05/20 1454

## 2020-12-05 NOTE — ED Triage Notes (Signed)
Sinus/facial pain with sore throat. Denies cough and fever. Starting Sunday morning.

## 2020-12-28 ENCOUNTER — Other Ambulatory Visit: Payer: Self-pay | Admitting: Internal Medicine

## 2020-12-28 DIAGNOSIS — E2839 Other primary ovarian failure: Secondary | ICD-10-CM

## 2021-01-01 ENCOUNTER — Other Ambulatory Visit: Payer: Self-pay | Admitting: Internal Medicine

## 2021-01-01 DIAGNOSIS — Z1231 Encounter for screening mammogram for malignant neoplasm of breast: Secondary | ICD-10-CM

## 2021-06-18 ENCOUNTER — Ambulatory Visit: Payer: 59

## 2021-12-20 ENCOUNTER — Ambulatory Visit: Payer: Self-pay | Admitting: *Deleted

## 2021-12-20 NOTE — Telephone Encounter (Signed)
SOB with exertion Reason for Disposition  Weakness is a chronic symptom (recurrent or ongoing AND present > 4 weeks)  Answer Assessment - Initial Assessment Questions 1. RESPIRATORY STATUS: "Describe your breathing?" (e.g., wheezing, shortness of breath, unable to speak, severe coughing)      SOB- with exertion 2. ONSET: "When did this breathing problem begin?"      Always fatigued, weak, no energy- 2-3 months 3. PATTERN "Does the difficult breathing come and go, or has it been constant since it started?"      Hx COVID, SOB exertion 4. SEVERITY: "How bad is your breathing?" (e.g., mild, moderate, severe)    - MILD: No SOB at rest, mild SOB with walking, speaks normally in sentences, can lie down, no retractions, pulse < 100.    - MODERATE: SOB at rest, SOB with minimal exertion and prefers to sit, cannot lie down flat, speaks in phrases, mild retractions, audible wheezing, pulse 100-120.    - SEVERE: Very SOB at rest, speaks in single words, struggling to breathe, sitting hunched forward, retractions, pulse > 120      Mild- comes and goes 5. RECURRENT SYMPTOM: "Have you had difficulty breathing before?" If Yes, ask: "When was the last time?" and "What happened that time?"      Never had problems in the past- COVID made worse 6. CARDIAC HISTORY: "Do you have any history of heart disease?" (e.g., heart attack, angina, bypass surgery, angioplasty)      Heart murmer 7. LUNG HISTORY: "Do you have any history of lung disease?"  (e.g., pulmonary embolus, asthma, emphysema)     none 8. CAUSE: "What do you think is causing the breathing problem?"      Not sure- worse after COVID 9. OTHER SYMPTOMS: "Do you have any other symptoms? (e.g., dizziness, runny nose, cough, chest pain, fever)     migraines 10. O2 SATURATION MONITOR:  "Do you use an oxygen saturation monitor (pulse oximeter) at home?" If Yes, ask: "What is your reading (oxygen level) today?" "What is your usual oxygen saturation reading?"  (e.g., 95%)         11. PREGNANCY: "Is there any chance you are pregnant?" "When was your last menstrual period?"         12. TRAVEL: "Have you traveled out of the country in the last month?" (e.g., travel history, exposures)  Answer Assessment - Initial Assessment Questions 1. DESCRIPTION: "Describe how you are feeling."     Fatigue, weakness 2. SEVERITY: "How bad is it?"  "Can you stand and walk?"   - MILD (0-3): Feels weak or tired, but does not interfere with work, school or normal activities.   - MODERATE (4-7): Able to stand and walk; weakness interferes with work, school, or normal activities.   - SEVERE (8-10): Unable to stand or walk; unable to do usual activities.     Mild/moderate 3. ONSET: "When did these symptoms begin?" (e.g., hours, days, weeks, months)     Chronic-2-3 months  4. CAUSE: "What do you think is causing the weakness or fatigue?" (e.g., not drinking enough fluids, medical problem, trouble sleeping)     Migraines, doesn't sleep well 5. NEW MEDICINES:  "Have you started on any new medicines recently?" (e.g., opioid pain medicines, benzodiazepines, muscle relaxants, antidepressants, antihistamines, neuroleptics, beta blockers)     no 6. OTHER SYMPTOMS: "Do you have any other symptoms?" (e.g., chest pain, fever, cough, SOB, vomiting, diarrhea, bleeding, other areas of pain)     migraines 7. PREGNANCY: "Is  there any chance you are pregnant?" "When was your last menstrual period?"  Protocols used: Breathing Difficulty-A-AH, Weakness (Generalized) and Fatigue-A-AH

## 2021-12-20 NOTE — Telephone Encounter (Signed)
  Chief Complaint: request NP appointment Symptoms: SOB with exertion, fatigue Frequency: 2-3 months Pertinent Negatives: Patient denies chest pain, fever, cough, SOB, vomiting, diarrhea, bleeding, other areas of pain) Disposition: [] ED /[x] Urgent Care (no appt availability in office) / [] Appointment(In office/virtual)/ []  [facility]/ [] Home Care/ [] Refused Recommended Disposition /[] [place] [facility]/ []  Follow-up with PCP Additional Notes: Patient advised of possible NP appointment within [place] offices- she will call. Patient advised UC if no appointment within the next 2 weeks

## 2021-12-26 ENCOUNTER — Encounter: Payer: Self-pay | Admitting: Family

## 2021-12-26 ENCOUNTER — Ambulatory Visit (INDEPENDENT_AMBULATORY_CARE_PROVIDER_SITE_OTHER): Payer: Commercial Managed Care - HMO | Admitting: Family

## 2021-12-26 VITALS — BP 110/84 | HR 80 | Temp 97.5°F | Ht 62.0 in | Wt 133.8 lb

## 2021-12-26 DIAGNOSIS — J4520 Mild intermittent asthma, uncomplicated: Secondary | ICD-10-CM | POA: Insufficient documentation

## 2021-12-26 DIAGNOSIS — Z113 Encounter for screening for infections with a predominantly sexual mode of transmission: Secondary | ICD-10-CM | POA: Diagnosis not present

## 2021-12-26 DIAGNOSIS — E2839 Other primary ovarian failure: Secondary | ICD-10-CM

## 2021-12-26 DIAGNOSIS — G43711 Chronic migraine without aura, intractable, with status migrainosus: Secondary | ICD-10-CM

## 2021-12-26 DIAGNOSIS — E559 Vitamin D deficiency, unspecified: Secondary | ICD-10-CM

## 2021-12-26 DIAGNOSIS — Z0001 Encounter for general adult medical examination with abnormal findings: Secondary | ICD-10-CM

## 2021-12-26 DIAGNOSIS — Z1231 Encounter for screening mammogram for malignant neoplasm of breast: Secondary | ICD-10-CM

## 2021-12-26 DIAGNOSIS — N950 Postmenopausal bleeding: Secondary | ICD-10-CM | POA: Diagnosis not present

## 2021-12-26 MED ORDER — ALBUTEROL SULFATE HFA 108 (90 BASE) MCG/ACT IN AERS: 2.0000 | INHALATION_SPRAY | Freq: Four times a day (QID) | RESPIRATORY_TRACT | 0 refills | Status: DC | PRN

## 2021-12-26 NOTE — Progress Notes (Signed)
Phone [phone]  Subjective:   Patient is a 62 y.o. female presenting for annual physical.    Chief Complaint  Patient presents with   Establish Care   Shortness of Breath    Pt c/o SOB and fatigue at certain times of the day and mostly at night.    Dysuria    Pt c/o burning during urination and slight odor. Pt states symptoms has been going on for a while. Pt states she drinks a lot of sodas and teas but slowed down on them. Pt states she was wearing a lot of thongs for a while and she slowed up on that also.    Migraine    Pt c/o migraines for years and has been taking Excedrin for 2 years causing her stomach to hurt. Pt states her migraines go from her temporals and goes up.    Annual Exam    non-fasting    See problem oriented charting- ROS- full  review of systems was completed and negative except for: migraines noted in HPI above.  The following were reviewed and entered/updated in epic: Past Medical History:  Diagnosis Date   ANXIETY 11/16/2009   CHEST WALL PAIN, ACUTE 04/16/2010   Constipation    Cough 02/02/2020   DVT (deep venous thrombosis) (HCC)    FATIGUE 03/28/2009   Fibromyalgia    Hernia    KNEE PAIN 02/04/2010   MENTAL CONFUSION 11/16/2009   Migraine headache 12/20/2010   Chronic and persistent   MIGRAINE, COMMON W/INTRACTABLE MIGRAINE 03/28/2009   Nausea alone 03/28/2009   Phlebitis, complicating pregnancy or puerperium    Presence of IVC filter    Vertigo    Patient Active Problem List   Diagnosis Date Noted   Vitamin D deficiency 12/27/2021   Postmenopausal bleeding 12/27/2021   Mild intermittent asthma without complication 12/26/2021   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   History of COVID-19 02/02/2020   Acute bilateral low back pain without sciatica 02/02/2020   Chronic migraine without aura, with intractable migraine, so stated, with status migrainosus 07/16/2017   Rectal pain, chronic 08/11/2012   Unspecified constipation 08/11/2012    Paresthesia 12/20/2010   Past Surgical History:  Procedure Laterality Date   CESAREAN SECTION     ENDOMETRIAL ABLATION     HERNIA REPAIR      Family History  Problem Relation Age of Onset   Diabetes Mother    Heart disease Mother    Kidney disease Mother    Hypertension Mother    Stroke Mother    Cervical cancer Mother    Diabetes Other    Hypertension Other    Kidney disease Other    Cancer Father     Medications- reviewed and updated Current Outpatient Medications  Medication Sig Dispense Refill   albuterol (VENTOLIN HFA) 108 (90 Base) MCG/ACT inhaler Inhale 2 puffs into the lungs every 6 (six) hours as needed for wheezing or shortness of breath. 8 g 0   aspirin-acetaminophen-caffeine (EXCEDRIN MIGRAINE) 250-250-65 MG tablet Take by mouth every 6 (six) hours as needed for headache.     No current facility-administered medications for this visit.    Allergies-reviewed and updated Allergies  Allergen Reactions   Blueberry [Vaccinium Angustifolium] Anaphylaxis   Grapeseed Extract [Nutritional Supplements] Anaphylaxis    Reaction to muscadines    Lyrica [Pregabalin] Swelling    Body and throat swell   Oxycodone Itching and Nausea And Vomiting   Topiramate     REACTION: confusion-pt thinks 100mg  made her confused  Hydrocodone-Acetaminophen Rash and Hives    Social History   Social History Narrative   Lives at home alone   Right handed   Drinks a cup of caffeine once a week    Objective:  BP 110/84 (BP Location: Left Arm, Patient Position: Sitting, Cuff Size: Large)   Pulse 80   Temp (!) 97.5 F (36.4 C) (Temporal)   Ht 5\' 2"  (1.575 m)   Wt 133 lb 12.8 oz (60.7 kg)   SpO2 97%   BMI 24.47 kg/m  Physical Exam Vitals and nursing note reviewed.  Constitutional:      Appearance: Normal appearance.  HENT:     Head: Normocephalic.     Right Ear: Tympanic membrane normal.     Left Ear: Tympanic membrane normal.     Nose: Nose normal.     Mouth/Throat:      Mouth: Mucous membranes are moist.  Eyes:     Pupils: Pupils are equal, round, and reactive to light.  Cardiovascular:     Rate and Rhythm: Normal rate and regular rhythm.  Pulmonary:     Effort: Pulmonary effort is normal.     Breath sounds: Normal breath sounds.  Musculoskeletal:        General: Normal range of motion.     Cervical back: Normal range of motion.  Lymphadenopathy:     Cervical: No cervical adenopathy.  Skin:    General: Skin is warm and dry.  Neurological:     Mental Status: She is alert.  Psychiatric:        Mood and Affect: Mood normal.        Behavior: Behavior normal.     Assessment and Plan   Health Maintenance counseling: 1. Anticipatory guidance: Patient counseled regarding regular dental exams q6 months, eye exams,  avoiding smoking and second hand smoke, limiting alcohol to 1 beverage per day, no illicit drugs.   2. Risk factor reduction:  Advised patient of need for regular exercise and diet rich with fruits and vegetables to reduce risk of heart attack and stroke. Exercise- none.  Wt Readings from Last 3 Encounters:  12/26/21 133 lb 12.8 oz (60.7 kg)  03/06/20 133 lb 0.1 oz (60.3 kg)  02/14/20 134 lb (60.8 kg)   3. Immunizations/screenings/ancillary studies Immunization History  Administered Date(s) Administered   Influenza,inj,Quad PF,6+ Mos 01/31/2013   Health Maintenance Due  Topic Date Due   COVID-19 Vaccine (1) Never done   Zoster Vaccines- Shingrix (1 of 2) Never done   COLONOSCOPY (Pts 45-26yrs Insurance coverage will need to be confirmed)  11/02/2013   PAP SMEAR-Modifier  02/01/2016   MAMMOGRAM  07/11/2017   INFLUENZA VACCINE  12/10/2021    4. Cervical cancer screening- due, pt having vaginal spotting, referring to GYN 5. Breast cancer screening-  mammogram: ordering today 6. Colon cancer screening - ? 7. Skin cancer screening- advised regular sunscreen use. Denies worrisome, changing, or new skin lesions.  8. Birth control/STD  check- postmenopausal, checking STDs today 9. Osteoporosis screening- never 10. Alcohol screening: none 11. Smoking associated screening (lung cancer screening, AAA screen 65-75, UA)- non- smoker  Problem List Items Addressed This Visit       Cardiovascular and Mediastinum   Chronic migraine without aura, with intractable migraine, so stated, with status migrainosus    chronic has taken excedrin migraine OTC for long time was established with headache clinic locally but not seen in over a year, pt to call and re-establish, will let me know if  referral is needed f/u prn      Relevant Medications   aspirin-acetaminophen-caffeine (EXCEDRIN MIGRAINE) 250-250-65 MG tablet     Respiratory   Mild intermittent asthma without complication    chronic has no current inhaler refilling albuterol rescue inhaler, reviewed use & SE f/u in 58mos or prn      Relevant Medications   albuterol (VENTOLIN HFA) 108 (90 Base) MCG/ACT inhaler     Other   Vitamin D deficiency   Relevant Orders   Vitamin D (25 hydroxy)   Postmenopausal bleeding    chronic off and on spotting for the last year hx of uterine ablation in her late 64s referring to GYN, also due for PAP      Relevant Orders   Ambulatory referral to Gynecology   Other Visit Diagnoses     Encounter for general adult medical examination with abnormal findings    -  Primary   Relevant Orders   Comprehensive metabolic panel   TSH   Lipid panel   CBC with Differential/Platelet   Screening examination for STD (sexually transmitted disease)       Relevant Orders   Urine cytology ancillary only   Estrogen deficiency       Relevant Orders   DG Bone Density   Encounter for screening mammogram for breast cancer       Relevant Orders   MM Digital Screening      Recommended follow up: Return for any future concerns. No future appointments.  Lab/Order associations:  non-fasting   Dulce Sellar, NP

## 2021-12-26 NOTE — Patient Instructions (Addendum)
Welcome to [facility], It was a pleasure meeting you today!   I will review your lab results via MyChart in a few days.  As discussed, I have sent an Albuterol inhaler to your pharmacy.  I have sent a referral to Gynecology to follow up on your vaginal spotting and to get an updated PAP smear.  Call your headache specialist and schedule an appointment, if they need a referral, let me know.      PLEASE NOTE: If you had any LAB tests please let us know if you have not heard back within a few days. You may see your results on MyChart before we have a chance to review them but we will give you a call once they are reviewed by [place]. If we ordered any REFERRALS today, please let us know if you have not heard from their office within the next week.  Let us know through MyChart if you are needing REFILLS, or have your pharmacy send [place] the request. You can also use MyChart to communicate with me or any office staff.   Please try these tips to maintain a healthy lifestyle:  Eat most of your calories during the day when you are active. Eliminate processed foods including packaged sweets (pies, cakes, cookies), reduce intake of potatoes, white bread, white pasta, and white rice. Look for whole grain options, oat flour or almond flour.  Each meal should contain half fruits/vegetables, one quarter protein, and one quarter carbs (no bigger than a computer mouse).  Cut down on sweet beverages. This includes juice, soda, and sweet tea. Also watch fruit intake, though this is a healthier sweet option, it still contains natural sugar! Limit to 3 servings daily.  Drink at least 1 8oz. glass of water with each meal and aim for at least 8 glasses per day  Exercise at least 150 minutes every week.

## 2021-12-27 DIAGNOSIS — E559 Vitamin D deficiency, unspecified: Secondary | ICD-10-CM | POA: Insufficient documentation

## 2021-12-27 DIAGNOSIS — N950 Postmenopausal bleeding: Secondary | ICD-10-CM | POA: Insufficient documentation

## 2021-12-27 LAB — LIPID PANEL
Cholesterol: 216 mg/dL — ABNORMAL HIGH (ref 0–200)
HDL: 74.2 mg/dL (ref >39.00)
LDL Cholesterol: 117 mg/dL — ABNORMAL HIGH (ref 0–99)
NonHDL: 142.03
Total CHOL/HDL Ratio: 3
Triglycerides: 125 mg/dL (ref 0.0–149.0)
VLDL: 25 mg/dL (ref 0.0–40.0)

## 2021-12-27 LAB — COMPREHENSIVE METABOLIC PANEL
ALT: 22 U/L (ref 0–35)
AST: 19 U/L (ref 0–37)
Albumin: 4.5 g/dL (ref 3.5–5.2)
Alkaline Phosphatase: 72 U/L (ref 39–117)
BUN: 17 mg/dL (ref 6–23)
CO2: 28 mEq/L (ref 19–32)
Calcium: 10.1 mg/dL (ref 8.4–10.5)
Chloride: 106 mEq/L (ref 96–112)
Creatinine, Ser: 0.8 mg/dL (ref 0.40–1.20)
GFR: 80.56 mL/min (ref >60.00)
Glucose, Bld: 98 mg/dL (ref 70–99)
Potassium: 4 mEq/L (ref 3.5–5.1)
Sodium: 144 mEq/L (ref 135–145)
Total Bilirubin: 0.3 mg/dL (ref 0.2–1.2)
Total Protein: 7.6 g/dL (ref 6.0–8.3)

## 2021-12-27 LAB — CBC WITH DIFFERENTIAL/PLATELET
Basophils Absolute: 0.1 10*3/uL (ref 0.0–0.1)
Basophils Relative: 1.3 % (ref 0.0–3.0)
Eosinophils Absolute: 0 10*3/uL (ref 0.0–0.7)
Eosinophils Relative: 0.9 % (ref 0.0–5.0)
HCT: 40.7 % (ref 36.0–46.0)
Hemoglobin: 13.2 g/dL (ref 12.0–15.0)
Lymphocytes Relative: 34.9 % (ref 12.0–46.0)
Lymphs Abs: 1.9 10*3/uL (ref 0.7–4.0)
MCHC: 32.4 g/dL (ref 30.0–36.0)
MCV: 87.5 fl (ref 78.0–100.0)
Monocytes Absolute: 0.4 10*3/uL (ref 0.1–1.0)
Monocytes Relative: 8.1 % (ref 3.0–12.0)
Neutro Abs: 3 10*3/uL (ref 1.4–7.7)
Neutrophils Relative %: 54.8 % (ref 43.0–77.0)
Platelets: 359 10*3/uL (ref 150.0–400.0)
RBC: 4.65 Mil/uL (ref 3.87–5.11)
RDW: 13.7 % (ref 11.5–15.5)
WBC: 5.4 10*3/uL (ref 4.0–10.5)

## 2021-12-27 LAB — TSH: TSH: 0.8 u[IU]/mL (ref 0.35–5.50)

## 2021-12-27 LAB — VITAMIN D 25 HYDROXY (VIT D DEFICIENCY, FRACTURES): VITD: 14.01 ng/mL — ABNORMAL LOW (ref 30.00–100.00)

## 2021-12-27 NOTE — Assessment & Plan Note (Signed)
   chronic  off and on spotting for the last year  hx of uterine ablation in her late 74s  referring to GYN, also due for PAP

## 2021-12-27 NOTE — Assessment & Plan Note (Signed)
   chronic  has no current inhaler  refilling albuterol rescue inhaler, reviewed use & SE  f/u in 20mos or prn

## 2021-12-27 NOTE — Assessment & Plan Note (Signed)
   chronic  has taken excedrin migraine OTC for long time  was established with headache clinic locally but not seen in over a year, pt to call and re-establish, will let me know if referral is needed  f/u prn

## 2021-12-30 ENCOUNTER — Other Ambulatory Visit: Payer: Self-pay | Admitting: Family

## 2021-12-30 ENCOUNTER — Telehealth: Payer: Self-pay | Admitting: Family

## 2021-12-30 DIAGNOSIS — E559 Vitamin D deficiency, unspecified: Secondary | ICD-10-CM

## 2021-12-30 MED ORDER — VITAMIN D3 1.25 MG (50000 UT) PO CAPS: 1.0000 | ORAL_CAPSULE | ORAL | 2 refills | Status: DC

## 2021-12-30 NOTE — Progress Notes (Signed)
Your glucose, electrolytes, blood count, thyroid, liver & kidney function are all normal. Your Vitamin D level is low, and I have sent a prescription refill for this to your pharmacy. This is to be taken just weekly.  Your total cholesterol number & LDL (bad #) are high. Need to reduce any fried foods, alcohol, nonnutritional snacks e.g. chips/cookies,pies, cakes and candies, fatty meat (red meat), high fat dairy foods:  including cheese, milk, ice cream.  Increase fruits/vegetables/fiber.   Continue or restart an exercise routine, shooting for 5-7days per week.   Recommend repeating fasting lipids (no food or drink except black coffee or water after midnight) in 6 months with an office visit, can schedule today.

## 2021-12-30 NOTE — Telephone Encounter (Signed)
Patient requests a RX for Vitamin D be sent to  [id number] - [place], [place] - [address] AT [facility] Phone:  [phone]  Fax:  [phone]

## 2021-12-30 NOTE — Telephone Encounter (Signed)
labs reviewed & RX sent, thx

## 2022-01-05 NOTE — Progress Notes (Signed)
Both referrals are fine. for the GYN, use encounter for PAP smear as dx code. Thx

## 2022-01-08 ENCOUNTER — Telehealth: Payer: Self-pay | Admitting: Family

## 2022-01-08 NOTE — Telephone Encounter (Signed)
Patient states she is not feeling better. She wants to know if she could increase vitamin D dosage. Please advise.

## 2022-01-09 NOTE — Addendum Note (Signed)
Addended by: Candie Chroman on: 01/09/2022 01:29 PM   Modules accepted: Orders

## 2022-01-09 NOTE — Telephone Encounter (Signed)
No, this is the highest dose - she has to give more time, it can take up to a month to see improvement.

## 2022-01-10 NOTE — Telephone Encounter (Signed)
I called pt and let her know per Judeth Cornfield that this is the highest dose and she has to give more time, it can take up to a month to see improvement. Pt gave a verbalized understanding. Pt was also given referral addresses and tel numbers to neuro and gynecology.

## 2022-01-17 ENCOUNTER — Other Ambulatory Visit: Payer: Self-pay | Admitting: Family

## 2022-01-17 DIAGNOSIS — J4520 Mild intermittent asthma, uncomplicated: Secondary | ICD-10-CM

## 2022-01-21 ENCOUNTER — Encounter: Payer: Self-pay | Admitting: Nurse Practitioner

## 2022-01-21 ENCOUNTER — Ambulatory Visit: Payer: Commercial Managed Care - HMO | Admitting: Nurse Practitioner

## 2022-01-21 ENCOUNTER — Other Ambulatory Visit (HOSPITAL_COMMUNITY)
Admission: RE | Admit: 2022-01-21 | Discharge: 2022-01-21 | Disposition: A | Discharge status: Home / Self Care | Payer: Commercial Managed Care - HMO | Source: Ambulatory Visit | Attending: Nurse Practitioner | Admitting: Nurse Practitioner

## 2022-01-21 VITALS — BP 110/72 | HR 80 | Ht 62.0 in | Wt 134.0 lb

## 2022-01-21 DIAGNOSIS — Z01419 Encounter for gynecological examination (general) (routine) without abnormal findings: Secondary | ICD-10-CM | POA: Insufficient documentation

## 2022-01-21 DIAGNOSIS — R300 Dysuria: Secondary | ICD-10-CM

## 2022-01-21 DIAGNOSIS — R5383 Other fatigue: Secondary | ICD-10-CM | POA: Diagnosis not present

## 2022-01-21 DIAGNOSIS — N950 Postmenopausal bleeding: Secondary | ICD-10-CM | POA: Diagnosis not present

## 2022-01-21 DIAGNOSIS — M810 Age-related osteoporosis without current pathological fracture: Secondary | ICD-10-CM

## 2022-01-21 DIAGNOSIS — Z1211 Encounter for screening for malignant neoplasm of colon: Secondary | ICD-10-CM

## 2022-01-21 LAB — URINALYSIS, COMPLETE W/RFL CULTURE
Bacteria, UA: NONE SEEN /HPF
Bilirubin Urine: NEGATIVE
Casts: NONE SEEN /LPF
Crystals: NONE SEEN /HPF
Glucose, UA: NEGATIVE
Hyaline Cast: NONE SEEN /LPF
Ketones, ur: NEGATIVE
Leukocyte Esterase: NEGATIVE
Nitrites, Initial: NEGATIVE
Protein, ur: NEGATIVE
RBC / HPF: NONE SEEN /HPF (ref 0–2)
Specific Gravity, Urine: 1.022 (ref 1.001–1.035)
WBC, UA: NONE SEEN /HPF (ref 0–5)
Yeast: NONE SEEN /HPF
pH: 5 (ref 5.0–8.0)

## 2022-01-21 LAB — WET PREP FOR TRICH, YEAST, CLUE

## 2022-01-21 LAB — NO CULTURE INDICATED

## 2022-01-21 NOTE — Progress Notes (Signed)
Bailey Patterson [birth year] [id number]   History:  61 y.o. [id number] presents as new patient to establish care. Postmenopausal - no HRT. Reports occasional vaginal spotting this past year. Complains of mild vaginal itching and burning with urination. Normal pap history. Was evaluated for fatigue at PCP office a few weeks ago, found to be Vit D deficient, on high dose supplement now. Normal CBC, TSH, electrolytes. Is working 4 jobs and knows this could also be causing her tiredness. Her migraines have also been worse lately. She saw a headache clinic in the past and plans to return there for management. H/O DVT, migraines, osteoporosis (on no treatment).   Gynecologic History No LMP recorded. Patient is postmenopausal.   Contraception/Family planning: post menopausal status Sexually active: No  Health Maintenance Last Pap: 01/31/2013. Results were: Normal Last mammogram: 07/12/2015. Results were: Normal Last colonoscopy: 11/03/2003. Results were: Normal Last Dexa: 04/29/2016. Results were: T-score -2.9  Past medical history, past surgical history, family history and social history were all reviewed and documented in the EPIC chart. 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 5 daughters, 11 grandchildren.   ROS:  A ROS was performed and pertinent positives and negatives are included.  Exam:  Vitals:   01/21/22 1131  BP: 110/72  Pulse: 80  SpO2: 99%  Weight: 134 lb (60.8 kg)  Height: 5\' 2"  (1.575 m)   Body mass index is 24.51 kg/m.  General appearance:  Normal Thyroid:  Symmetrical, normal in size, without palpable masses or nodularity. Respiratory  Auscultation:  Clear without wheezing or rhonchi Cardiovascular  Auscultation:  Regular rate, without rubs, murmurs or gallops  Edema/varicosities:  Not grossly evident Abdominal  Soft,nontender, without masses, guarding or rebound.  Liver/spleen:  No organomegaly noted  Hernia:  None appreciated  Skin  Inspection:   Grossly normal Breasts: Examined lying and sitting.   Right: Without masses, retractions, nipple discharge or axillary adenopathy.   Left: Without masses, retractions, nipple discharge or axillary adenopathy. Genitourinary   Inguinal/mons:  Normal without inguinal adenopathy  External genitalia:  Normal appearing vulva with no masses, tenderness, or lesions  BUS/Urethra/Skene's glands:  Normal  Vagina:  Normal appearing with normal color and discharge, no lesions  Cervix:  Normal appearing without discharge or lesions  Uterus:  Normal in size, shape and contour.  Midline and mobile, nontender  Adnexa/parametria:     Rt: Normal in size, without masses or tenderness.   Lt: Normal in size, without masses or tenderness.  Anus and perineum: Normal  Digital rectal exam: Normal sphincter tone without palpated masses or tenderness  Patient informed chaperone available to be present for breast and pelvic exam. Patient has requested no chaperone to be present. Patient has been advised what will be completed during breast and pelvic exam.   Wet prep negative UA negative  Assessment/Plan:  61 y.o. 48 to establish care.   Well female exam with routine gynecological exam - Plan: Cytology - PAP( [place]). Education provided on SBEs, importance of preventative screenings, current guidelines, high calcium diet, regular exercise, and multivitamin daily.  Labs with PCP.   Fatigue, unspecified type - Was evaluated for fatigue at PCP office in August, found to be Vit D deficient, on high dose supplement now. Normal CBC, TSH, electrolytes. Is working 4 jobs and knows this could also be causing her tiredness. Her migraines have also been worse lately. She saw a headache clinic in the past and plans to return there for management. Recommend decreasing stress  with overworking, work on good sleep routine, finish full course of vitamin D supplement and manage migraines.   Postmenopausal bleeding - Plan:  US PELVIS TRANSVAGINAL NON-OB (TV ONLY). Reports occasional spotting over the last year.   Screening for colon cancer - Plan: Cologuard.. 2005 colonoscopy. Discussed current guidelines and importance of preventative screenings. Average risk, Cologuard candidate.   Burning with urination - Plan: Urinalysis,Complete w/RFL Culture, WET PREP FOR TRICH, YEAST, CLUE. Negative UA and wet prep. She has recently changed soaps and detergent. Recommend going back to previous or using something for sensitive skin without fragrance.   Age-related osteoporosis without current pathological fracture - T-score -2.9 in 2017. Recommend repeating DXA. Looks like PCP ordered.   Screening for cervical cancer - Normal Pap history. Pap today.   Screening for breast cancer - Normal mammogram history. Overdue and encouraged to schedule now. PCP send referral. Normal breast exam today.  Will return for ultrasound for PMB.  Return in 1 year for annual.     Olivia Mackie DNP, 12:07 PM 01/21/2022

## 2022-01-27 LAB — CYTOLOGY - PAP
Comment: NEGATIVE
Diagnosis: NEGATIVE
High risk HPV: NEGATIVE

## 2022-02-03 ENCOUNTER — Encounter: Payer: Self-pay | Admitting: *Deleted

## 2022-02-15 LAB — COLOGUARD: COLOGUARD: NEGATIVE

## 2022-02-19 ENCOUNTER — Other Ambulatory Visit: Payer: Commercial Managed Care - HMO

## 2022-02-19 ENCOUNTER — Other Ambulatory Visit: Payer: Commercial Managed Care - HMO | Admitting: Nurse Practitioner

## 2022-02-19 DIAGNOSIS — Z0289 Encounter for other administrative examinations: Secondary | ICD-10-CM

## 2022-02-19 NOTE — Progress Notes (Deleted)
   Acute Office Visit  Subjective:    Patient ID: Bailey Patterson, female    DOB: 07-Aug-1960, 61 y.o.   MRN: [id number]   HPI 61 y.o. presents today for ultrasound for PMB. Seen as new patient 01/21/2022 where she reported occasional vaginal spotting this past year. Postmenopausal - no HRT. Normal pap at that visit.    Review of Systems     Objective:    Physical Exam  There were no vitals taken for this visit. Wt Readings from Last 3 Encounters:  01/21/22 134 lb (60.8 kg)  12/26/21 133 lb 12.8 oz (60.7 kg)  03/06/20 133 lb 0.1 oz (60.3 kg)        Patient informed chaperone available to be present for breast and/or pelvic exam. Patient has requested no chaperone to be present. Patient has been advised what will be completed during breast and pelvic exam.   Assessment & Plan:   Problem List Items Addressed This Visit   None       Tamela Gammon DNP, 1:31 PM 02/19/2022

## 2022-03-03 ENCOUNTER — Encounter: Payer: Self-pay | Admitting: Nurse Practitioner

## 2022-03-05 ENCOUNTER — Telehealth: Payer: Self-pay | Admitting: Nurse Practitioner

## 2022-03-11 ENCOUNTER — Telehealth: Payer: Self-pay | Admitting: Nurse Practitioner

## 2022-03-11 NOTE — Telephone Encounter (Signed)
Patient no show for her October 11 ultrasound and I left her message on October 16 and 19 also mailed her a letter on October 23 for patient to call and reschedule. Patient has not rescheduled.

## 2022-03-13 NOTE — Telephone Encounter (Signed)
Spoke with patient. Patient agreeable to schedule PUS for PMB. PUS scheduled for 04/10/22 at 3pm, consult to follow at 3:30 pm. Patient request return call to further discuss benefits prior to appt. Advised message sent to business office to return call to further discuss. Patient verbalizes understanding and is agreeable.   Routing to provider for final review. Patient is agreeable to disposition. Will close encounter.

## 2022-04-10 ENCOUNTER — Other Ambulatory Visit: Payer: Self-pay | Admitting: Nurse Practitioner

## 2022-04-10 ENCOUNTER — Encounter: Payer: Self-pay | Admitting: Nurse Practitioner

## 2022-04-10 ENCOUNTER — Ambulatory Visit (INDEPENDENT_AMBULATORY_CARE_PROVIDER_SITE_OTHER): Payer: Commercial Managed Care - HMO | Admitting: Nurse Practitioner

## 2022-04-10 ENCOUNTER — Ambulatory Visit: Payer: Commercial Managed Care - HMO

## 2022-04-10 VITALS — BP 130/84 | Resp 14 | Wt 130.0 lb

## 2022-04-10 DIAGNOSIS — N950 Postmenopausal bleeding: Secondary | ICD-10-CM

## 2022-04-10 DIAGNOSIS — N939 Abnormal uterine and vaginal bleeding, unspecified: Secondary | ICD-10-CM

## 2022-04-10 NOTE — Progress Notes (Signed)
   Acute Office Visit  Subjective:    Patient ID: Bailey Patterson, female    DOB: 10-Feb-1961, 61 y.o.   MRN: [id number]   HPI 61 y.o. presents today for ultrasound for PMB. Seen 01/21/22 for annual exam with reports of occasional vaginal spotting this last year. Normal pap history, most recent 01/21/22. Does experience some external burning and itching, vaginal dryness. Negative wet prep and UA last visit. Not sexually active.    Review of Systems  Constitutional: Negative.   Genitourinary: Negative.        Objective:    Physical Exam Constitutional:      Appearance: Normal appearance.   GU: Not indicated  BP 130/84   Resp 14   Wt 130 lb (59 kg)   BMI 23.78 kg/m  Wt Readings from Last 3 Encounters:  04/11/22 133 lb 8 oz (60.6 kg)  04/10/22 130 lb (59 kg)  01/21/22 134 lb (60.8 kg)        Assessment & Plan:   Problem List Items Addressed This Visit   None Visit Diagnoses     Vaginal spotting    -  Primary      U/S: Both transabdominal and transvaginal techniques were necessary to evaluate anatomy.  Anteverted uterus - retroflexed at C-section scars. No myometrial masses seen, trace amount of fluid within the endometrial cavity.  Combined wall thickness 3.4 mm, no masses or abnormal blood flow seen.  Both ovaries small with atrophic appearance.  No adnexal masses, no free fluid.  Plan: Ultrasound reviewed with patient. Unremarkable, thin endometrial lining. Will monitor at this time. If she experiences more spotting, vaginal estrogen recommended for possible GSM.     Olivia Mackie DNP, 8:02 AM 04/14/2022

## 2022-04-11 ENCOUNTER — Ambulatory Visit (INDEPENDENT_AMBULATORY_CARE_PROVIDER_SITE_OTHER): Payer: Commercial Managed Care - HMO | Admitting: Family

## 2022-04-11 ENCOUNTER — Encounter: Payer: Self-pay | Admitting: Family

## 2022-04-11 VITALS — BP 132/88 | HR 86 | Temp 97.7°F | Ht 62.0 in | Wt 133.5 lb

## 2022-04-11 DIAGNOSIS — K219 Gastro-esophageal reflux disease without esophagitis: Secondary | ICD-10-CM | POA: Diagnosis not present

## 2022-04-11 DIAGNOSIS — G43711 Chronic migraine without aura, intractable, with status migrainosus: Secondary | ICD-10-CM | POA: Diagnosis not present

## 2022-04-11 MED ORDER — METOPROLOL SUCCINATE ER 25 MG PO TB24: 25.0000 mg | ORAL_TABLET | Freq: Every evening | ORAL | 5 refills | Status: DC

## 2022-04-11 MED ORDER — NAPROXEN 500 MG PO TABS: 500.0000 mg | ORAL_TABLET | ORAL | 2 refills | Status: DC

## 2022-04-11 MED ORDER — SUMATRIPTAN SUCCINATE 50 MG PO TABS: 50.0000 mg | ORAL_TABLET | ORAL | 2 refills | Status: DC | PRN

## 2022-04-11 MED ORDER — PANTOPRAZOLE SODIUM 20 MG PO TBEC: 20.0000 mg | DELAYED_RELEASE_TABLET | Freq: Every day | ORAL | 3 refills | Status: DC

## 2022-04-11 NOTE — Patient Instructions (Addendum)
It was very nice to see you today!   I have sent over 2 medicines for you migraines: Metoprolol - start with 1/2 pill every night for 1 week, then increase to 1 full pill every night - this is to PREVENT the migraines.   I also sent generic Imitrex to take when you have the migraine. Start with 1 pill, take this with 1 Naproxen, you can take another Imitrex in 2 hours if needed.  Drink at least 64 oz water every day!  Lack of hydration is a migraine trigger.  I also have sent Pantoprazole to help your stomach acid, nausea, and pain - follow directions on the bottle. Let me know if this is not helping your symptoms. Eat a low acid diet, reduce caffeine.      PLEASE NOTE:  If you had any lab tests please let us know if you have not heard back within a few days. You may see your results on MyChart before we have a chance to review them but we will give you a call once they are reviewed by [place]. If we ordered any referrals today, please let us know if you have not heard from their office within the next week.

## 2022-04-11 NOTE — Progress Notes (Unsigned)
Patient ID: Bailey Patterson, female    DOB: 1960/11/18, 61 y.o.   MRN: [id number]  Chief Complaint  Patient presents with   Abdominal Pain    Pt c/o abdominal pain, nausea, blurry vision and fatigue for the last 4 weeks. Pt states she has not been eating well and not desire for food.   Migraine    Pt c/o Migraines, Takes 6 a day of Excedrin migraine which use to help but not much now. Pt states she use to take a lot of goody powder.     HPI:      Migraines:  reports having since teens, were menstrual, then better, but have been back almost daily. Reports taking Ulbrelvy, Elavil, Topamax, Nurtec, Gabapentin, Lyrica, none worked or had SE, Dr. Oval Linsey? who wants to try Botox.  Imitrex helped in beginning but then stopped working. Does not remember trying Metoprolol.  GERD: Patient is presenting for new symptoms. This has been associated with abdominal bloating, heartburn, midespigastric pain, and nausea.  She denies belching, chest pain, and cough. Symptoms have been present for 4 weeks. She denies dysphagia.  She has not lost weight. She denies melena, hematochezia, hematemesis, and coffee ground emesis. Medical therapy in the past has included antacids.    Assessment & Plan:   Problem List Items Addressed This Visit       Cardiovascular and Mediastinum   Chronic migraine without aura, with intractable migraine, so stated, with status migrainosus - Primary    chronic, Unstable has taken excedrin migraine OTC for long time reports seeing Dr Oval Linsey w/Neuro in past, but owes money and is trying to pay off debt sx not controlled well w/Excedrin hx of many meds, last discussed was Botox via Neuro restarting Imitrex 50mg  and Naproxen 500mg  for acute, advised pt on use & SE starting Metoprolol 25mg  qhs for preventive, advised on use & SE f/u 1 month       Relevant Medications   metoprolol succinate (TOPROL-XL) 25 MG 24 hr tablet   naproxen (NAPROSYN) 500 MG tablet   SUMAtriptan (IMITREX) 50  MG tablet     Digestive   Gastroesophageal reflux disease without esophagitis    new has taken OTC antacids with little relief sending Protonix 20mg  qd, advised to take bid x 2w, then reduce to qd f/u 1 mo      Relevant Medications   pantoprazole (PROTONIX) 20 MG tablet    Subjective:    Outpatient Medications Prior to Visit  Medication Sig Dispense Refill   albuterol (VENTOLIN HFA) 108 (90 Base) MCG/ACT inhaler TAKE 2 PUFFS BY MOUTH EVERY 6 HOURS AS NEEDED FOR WHEEZE OR SHORTNESS OF BREATH 8.5 each 5   aspirin-acetaminophen-caffeine (EXCEDRIN MIGRAINE) 250-250-65 MG tablet Take by mouth every 6 (six) hours as needed for headache.     Cholecalciferol (VITAMIN D3) 1.25 MG (50000 UT) CAPS Take 1 capsule by mouth once a week. Then start a daily OTC Vitamin D3 up to 2,000units. 12 capsule 2   No facility-administered medications prior to visit.   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   Paresthesia 12/20/2010   Mild   Phlebitis, complicating pregnancy or puerperium    Presence of IVC filter    Vertigo    Past Surgical History:  Procedure Laterality Date   CESAREAN SECTION     ENDOMETRIAL ABLATION     HERNIA REPAIR     Allergies  Allergen Reactions   Blueberry [Vaccinium Angustifolium] Anaphylaxis   Grapeseed Extract [Nutritional Supplements] Anaphylaxis    Reaction to muscadines    Lyrica [Pregabalin] Swelling    Body and throat swell   Oxycodone Itching and Nausea And Vomiting   Topiramate     REACTION:  confusion-pt thinks 100mg  made her confused   Hydrocodone-Acetaminophen Rash and Hives      Objective:    Physical Exam Vitals and nursing note reviewed.  Constitutional:      Appearance: Normal appearance.  Cardiovascular:     Rate and Rhythm: Normal rate and regular rhythm.  Pulmonary:     Effort: Pulmonary effort is normal.     Breath sounds: Normal breath sounds.  Musculoskeletal:        General: Normal range of motion.  Skin:    General: Skin is warm and dry.  Neurological:     Mental Status: She is alert.  Psychiatric:        Mood and Affect: Mood normal.        Behavior: Behavior normal.    BP 132/88 (BP Location: Left Arm, Patient Position: Sitting, Cuff Size: Large)   Pulse 86   Temp 97.7 F (36.5 C) (Temporal)   Ht 5\' 2"  (1.575 m)   Wt 133 lb 8 oz (60.6 kg)   SpO2 99%   BMI 24.42 kg/m  Wt Readings from Last 3 Encounters:  04/11/22 133 lb 8 oz (60.6 kg)  04/10/22 130 lb (59 kg)  01/21/22 134 lb (60.8 kg)       04/12/22, NP

## 2022-04-13 ENCOUNTER — Encounter: Payer: Self-pay | Admitting: Family

## 2022-04-13 NOTE — Assessment & Plan Note (Signed)
new has taken OTC antacids with little relief sending Protonix 20mg  qd, advised to take bid x 2w, then reduce to qd f/u 1 mo

## 2022-04-13 NOTE — Assessment & Plan Note (Addendum)
chronic, Unstable has taken excedrin migraine OTC for long time reports seeing Dr Oval Linsey w/Neuro in past, but owes money and is trying to pay off debt sx not controlled well w/Excedrin hx of many meds, last discussed was Botox via Neuro restarting Imitrex 50mg  and Naproxen 500mg  for acute, advised pt on use & SE starting Metoprolol 25mg  qhs for preventive, advised on use & SE f/u 1 month

## 2022-04-16 ENCOUNTER — Telehealth: Payer: Self-pay | Admitting: Family

## 2022-04-16 NOTE — Telephone Encounter (Signed)
Patient states: -Following 12/01 OV, she took metoprolol incorrectly on 12/03. Admits she didn't pay attention and took a full tablet rather than 1/2 as instructed. - After taking medication she felt drowsy for a couple of days  - After taking naproxen and imitrex she noticed these meds made her headache worse.  - Describes having a heavy feeling on head along with sharp pains at that time   Patient requests: - Recommendation from PCP on if she should continue with metoprolol but only take a half or stop completely  - Would also like to know some alternatives for the imitrex and naproxen

## 2022-04-16 NOTE — Telephone Encounter (Signed)
She should try the Metoprolol again, taking only 1/2 pill about an hour before bedtime - needs to use a pill splitter from the pharmacy. If still drowsy, but not as bad give it a few nights and see if it goes away as her body gets used to the medicine. First try the Imitrex by itself without the Naproxen, and she can cut the pill in half if it still worsens her migraine. Then she can let me know if helping or not.

## 2022-04-17 NOTE — Telephone Encounter (Signed)
I called pt and pt gave a verbalized understanding.  

## 2022-04-24 ENCOUNTER — Encounter: Payer: Self-pay | Admitting: *Deleted

## 2022-05-28 ENCOUNTER — Encounter: Payer: Self-pay | Admitting: Family

## 2022-05-28 ENCOUNTER — Ambulatory Visit (INDEPENDENT_AMBULATORY_CARE_PROVIDER_SITE_OTHER): Payer: Commercial Managed Care - HMO | Admitting: Family

## 2022-05-28 VITALS — BP 127/84 | HR 64 | Temp 98.0°F | Ht 62.0 in | Wt 134.2 lb

## 2022-05-28 DIAGNOSIS — G479 Sleep disorder, unspecified: Secondary | ICD-10-CM | POA: Diagnosis not present

## 2022-05-28 DIAGNOSIS — R1011 Right upper quadrant pain: Secondary | ICD-10-CM | POA: Diagnosis not present

## 2022-05-28 NOTE — Progress Notes (Signed)
Patient ID: Bailey Patterson, female    DOB: 1961-04-20, 62 y.o.   MRN: [id number]  Chief Complaint  Patient presents with   Abdominal Pain    Pt c/o abdominal pain and fatigue for the past 10 days. BM have been clay like and light colored.   Fatigue    HPI: Abdominal pain:  continued pain as before, but reports new pain in upper right stomach that moves to her side. Reports some nausea and having clay colored & lighter than usual stools. Has BM about qod.   Fatigue:  still persisting, pt denies smoking or alcohol use. Labs checked in 12/2021 just indicated low Vitamin D, and she reports she is taking this weekly but still fatigued. Reports falling asleep ok, but does not stay asleep. Wakes up for a few hours in middle of the night.   Assessment & Plan:   Problem List Items Addressed This Visit       Other   Sleep disorder    New - r/t chronic fatigue states she falls asleep fine but wakes up around 3am and can't go back to sleep reports drinking caffeine later in day, and sleeps with TV on but does try to set the timer to cut off discussed other sleep hygiene measures and handouts provided pt has tried Zquil OTC & states it works sometimes, doesn't want to take RX sleep meds f/u prn      Other Visit Diagnoses     RUQ pain    -  Primary   with clay colored stools, still has her GB, admits to eating fried foods fairly frequently. Advised pt to avoid fried and greasy foods as much as possible as this aggravates her GB and causes her sx. Pt also never started the Protonix I sent for GERD sx last visit. Advised this could help some of her stomach and nausea symptoms. Handout provided for healthy GB diet.    Subjective:    Outpatient Medications Prior to Visit  Medication Sig Dispense Refill   albuterol (VENTOLIN HFA) 108 (90 Base) MCG/ACT inhaler TAKE 2 PUFFS BY MOUTH EVERY 6 HOURS AS NEEDED FOR WHEEZE OR SHORTNESS OF BREATH 8.5 each 5   aspirin-acetaminophen-caffeine (EXCEDRIN  MIGRAINE) 250-250-65 MG tablet Take by mouth every 6 (six) hours as needed for headache.     Cholecalciferol (VITAMIN D3) 1.25 MG (50000 UT) CAPS Take 1 capsule by mouth once a week. Then start a daily OTC Vitamin D3 up to 2,000units. 12 capsule 2   naproxen (NAPROSYN) 500 MG tablet Take 1 tablet (500 mg total) by mouth as directed. Take 1 tab with your Imitrex at first sign of Migraine. 30 tablet 2   SUMAtriptan (IMITREX) 50 MG tablet Take 1 tablet (50 mg total) by mouth every 2 (two) hours as needed for migraine (Max dose of 4 pills in 24 hours). Take first dose with 1 Naproxen. 20 tablet 2   pantoprazole (PROTONIX) 20 MG tablet Take 1 tablet (20 mg total) by mouth daily. Take 1 pill twice a day for 2 weeks, then 1 pill every morning. (Patient not taking: Reported on 05/28/2022) 45 tablet 3   metoprolol succinate (TOPROL-XL) 25 MG 24 hr tablet Take 1 tablet (25 mg total) by mouth at bedtime. OK to start 1/2 pill for a week, then increase to 1 full pill. (Patient not taking: Reported on 05/28/2022) 30 tablet 5   No facility-administered medications prior to visit.   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   Paresthesia 12/20/2010   Mild   Phlebitis, complicating pregnancy or puerperium    Presence of IVC filter    Rectal pain, chronic 08/11/2012   4/14    Vertigo    Past Surgical History:  Procedure Laterality Date   CESAREAN SECTION     ENDOMETRIAL ABLATION     HERNIA REPAIR     Allergies  Allergen Reactions   Blueberry  [Vaccinium Angustifolium] Anaphylaxis   Grapeseed Extract [Nutritional Supplements] Anaphylaxis    Reaction to muscadines    Lyrica [Pregabalin] Swelling    Body and throat swell   Oxycodone Itching and Nausea And Vomiting   Topiramate     REACTION: confusion-pt thinks 100mg  made her confused   Hydrocodone-Acetaminophen Rash and Hives      Objective:    Physical Exam Vitals and nursing note reviewed.  Constitutional:      Appearance: Normal appearance.  Cardiovascular:     Rate and Rhythm: Normal rate and regular rhythm.  Pulmonary:     Effort: Pulmonary effort is normal.     Breath sounds: Normal breath sounds.  Musculoskeletal:        General: Normal range of motion.  Skin:    General: Skin is warm and dry.  Neurological:     Mental Status: She is alert.  Psychiatric:        Mood and Affect: Mood normal.        Behavior: Behavior normal.    BP 127/84 (BP Location: Left Arm, Patient Position: Sitting, Cuff Size: Large)   Pulse 64   Temp 98 F (36.7 C) (Temporal)   Ht 5\' 2"  (1.575 m)   Wt 134 lb 4 oz (60.9 kg)   SpO2 100%   BMI 24.55 kg/m  Wt Readings from Last 3 Encounters:  05/28/22 134 lb 4 oz (60.9 kg)  04/11/22 133 lb 8 oz (60.6 kg)  04/10/22 130 lb (59 kg)      Jeanie Sewer, NP

## 2022-05-28 NOTE — Assessment & Plan Note (Signed)
New - r/t chronic fatigue states she falls asleep fine but wakes up around 3am and can't go back to sleep reports drinking caffeine later in day, and sleeps with TV on but does try to set the timer to cut off discussed other sleep hygiene measures and handouts provided pt has tried Zquil OTC & states it works sometimes, doesn't want to take RX sleep meds f/u prn

## 2022-08-26 ENCOUNTER — Ambulatory Visit: Payer: Commercial Managed Care - HMO | Admitting: Family

## 2022-08-26 NOTE — Progress Notes (Deleted)
Patient ID: Bailey Patterson, female    DOB: [birth year], 62 y.o.   MRN: [id number]  No chief complaint on file.   HPI:   Assessment & Plan:  There are no diagnoses linked to this encounter.  Subjective:    Outpatient Medications Prior to Visit  Medication Sig Dispense Refill   albuterol (VENTOLIN HFA) 108 (90 Base) MCG/ACT inhaler TAKE 2 PUFFS BY MOUTH EVERY 6 HOURS AS NEEDED FOR WHEEZE OR SHORTNESS OF BREATH 8.5 each 5   aspirin-acetaminophen-caffeine (EXCEDRIN MIGRAINE) 250-250-65 MG tablet Take by mouth every 6 (six) hours as needed for headache.     Cholecalciferol (VITAMIN D3) 1.25 MG (50000 UT) CAPS Take 1 capsule by mouth once a week. Then start a daily OTC Vitamin D3 up to 2,000units. 12 capsule 2   naproxen (NAPROSYN) 500 MG tablet Take 1 tablet (500 mg total) by mouth as directed. Take 1 tab with your Imitrex at first sign of Migraine. 30 tablet 2   pantoprazole (PROTONIX) 20 MG tablet Take 1 tablet (20 mg total) by mouth daily. Take 1 pill twice a day for 2 weeks, then 1 pill every morning. (Patient not taking: Reported on 05/28/2022) 45 tablet 3   SUMAtriptan (IMITREX) 50 MG tablet Take 1 tablet (50 mg total) by mouth every 2 (two) hours as needed for migraine (Max dose of 4 pills in 24 hours). Take first dose with 1 Naproxen. 20 tablet 2   No facility-administered medications prior to visit.   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   Paresthesia 12/20/2010   Mild   Phlebitis, complicating pregnancy or puerperium     Presence of IVC filter    Rectal pain, chronic 08/11/2012   4/14    Vertigo    Past Surgical History:  Procedure Laterality Date   CESAREAN SECTION     ENDOMETRIAL ABLATION     HERNIA REPAIR     Allergies  Allergen Reactions   Blueberry [Vaccinium Angustifolium] Anaphylaxis   Grapeseed Extract [Nutritional Supplements] Anaphylaxis    Reaction to muscadines    Lyrica [Pregabalin] Swelling    Body and throat swell   Oxycodone Itching and Nausea And Vomiting   Topiramate     REACTION: confusion-pt thinks  made her confused   Hydrocodone-Acetaminophen Rash and Hives      Objective:    Physical Exam Vitals and nursing note reviewed.  Constitutional:      Appearance: Normal appearance.  Cardiovascular:     Rate and Rhythm: Normal rate and regular rhythm.  Pulmonary:     Effort: Pulmonary effort is normal.     Breath sounds: Normal breath sounds.  Musculoskeletal:        General: Normal range of motion.  Skin:    General: Skin is warm and dry.  Neurological:     Mental Status: She is alert.  Psychiatric:        Mood and Affect: Mood normal.        Behavior: Behavior normal.  There were no vitals taken for this visit. Wt Readings from Last 3 Encounters:  05/28/22 134 lb 4 oz (60.9 kg)  04/11/22 133 lb 8 oz (60.6 kg)  04/10/22 130 lb (59 kg)       Dulce Sellar, NP

## 2022-08-27 ENCOUNTER — Encounter: Payer: Self-pay | Admitting: Family

## 2022-08-27 ENCOUNTER — Ambulatory Visit (INDEPENDENT_AMBULATORY_CARE_PROVIDER_SITE_OTHER): Payer: Commercial Managed Care - HMO | Admitting: Family

## 2022-08-27 VITALS — BP 138/87 | HR 75 | Temp 98.0°F | Ht 62.0 in | Wt 131.2 lb

## 2022-08-27 DIAGNOSIS — M797 Fibromyalgia: Secondary | ICD-10-CM

## 2022-08-27 DIAGNOSIS — G43711 Chronic migraine without aura, intractable, with status migrainosus: Secondary | ICD-10-CM

## 2022-08-27 MED ORDER — CYCLOBENZAPRINE HCL 5 MG PO TABS: 5.0000 mg | ORAL_TABLET | Freq: Three times a day (TID) | ORAL | 1 refills | Status: DC | PRN

## 2022-08-27 MED ORDER — NAPROXEN 500 MG PO TABS: 500.0000 mg | ORAL_TABLET | Freq: Three times a day (TID) | ORAL | 2 refills | Status: DC | PRN

## 2022-08-27 NOTE — Assessment & Plan Note (Signed)
chronic having spasm-like pain in bilateral trapezius, top of shoulders, down into bilateral neck muscles, left chest and around her left breast reports taking Robaxin, Gabapentin, & Lyrica in past which either caused SE or not effective thinks Flexeril helped sending  Flexeril, can take  if tolerated tid prn, advised to take with 1 Naproxen 2-3 times per day f/u prn

## 2022-08-27 NOTE — Assessment & Plan Note (Signed)
chronic, Unstable reports Imitrex & Naproxen not working, still taking Excedrin migraine, and drinking extra caffeine reminded pt this only contributes to rebound HA reports she will have new ins soon and wants to still go back to Neuro for Botox shots refilling Naproxen & advised to take with Flexeril, advised on use & SE f/u prn

## 2022-08-27 NOTE — Patient Instructions (Addendum)
It was very nice to see you today!   I am sending Flexeril to help your muscle spasms and migraines. This can cause drowsiness, so start with one pill, if not drowsy can take 2 pills at a time. Also recommend restarting the Naproxen I had sent with the Imitrex previously for your migraines.  Take 1 Naproxen with 1 Flexeril at first sign of pain. You can take both up to 3 times per day. Do not take extra aspirin, Ibuprofen, or Advil, Aleve with the Naproxen.  Call your headache specialist for follow up appointment! The above plan is just to help until you can be seen!     PLEASE NOTE:  If you had any lab tests please let us know if you have not heard back within a few days. You may see your results on MyChart before we have a chance to review them but we will give you a call once they are reviewed by [place]. If we ordered any referrals today, please let us know if you have not heard from their office within the next week.

## 2022-08-27 NOTE — Progress Notes (Signed)
Patient ID: Bailey Patterson, female    DOB: 01-23-1961, 62 y.o.   MRN: [id number]  Chief Complaint  Patient presents with   Spasms    Pt c/o muscle spasms in shoulders and upper and lower back. Present for 2 weeks, more frequent.    Migraine    Migraines are getting worse. Pt is not taking Imitrex or Naproxen.     HPI: Migraines:  HX: reports having since teens, were menstrual, then better, but have been back almost daily. Reports taking Excedrin migraine OTC (6/day), goody powders,  Ulbrelvy, Elavil, Topamax, Nurtec, Gabapentin, Lyrica, none worked or had SE, Dr. Oval Linsey? who wants to try Botox. Pt reports she is getting new insurance soon and wants to schedule to see him again.  Imitrex helped in beginning but then stopped working. Given refill of Imitrex and Naproxen last visit, pt not taking. Also advised to try Metoprolol, 1/2 pill qhs.  Muscle spasms:  in front of shoulders down around left breast, and top of shoulders, also has swelling around her neck, also having some heart palpitations, also reports taking a lot of Excedrin migraine and extra caffeine to help her migraines.   Assessment & Plan:  Chronic migraine without aura, with intractable migraine, so stated, with status migrainosus Assessment & Plan: chronic, Unstable reports Imitrex & Naproxen not working, still taking Excedrin migraine, and drinking extra caffeine reminded pt this only contributes to rebound HA reports she will have new ins soon and wants to still go back to Neuro for Botox shots refilling Naproxen & advised to take with Flexeril, advised on use & SE f/u prn   Orders: -     Cyclobenzaprine HCl; Take 1-2 tablets (5-10 mg total) by mouth 3 (three) times daily as needed for muscle spasms (Take with Naproxen; Migraine or other pain.).  Dispense: 60 tablet; Refill: 1 -     Naproxen; Take 1 tablet (500 mg total) by mouth 3 (three) times daily as needed for moderate pain or headache (with meals). Take with 1  Flexeril for migraine or muscle spasm pain.  Dispense: 60 tablet; Refill: 2  Fibromyalgia Assessment & Plan: chronic having spasm-like pain in bilateral trapezius, top of shoulders, down into bilateral neck muscles, left chest and around her left breast reports taking Robaxin, Gabapentin, & Lyrica in past which either caused SE or not effective thinks Flexeril helped sending  Flexeril, can take  if tolerated tid prn, advised to take with 1 Naproxen 2-3 times per day f/u prn  Orders: -     Cyclobenzaprine HCl; Take 1-2 tablets (5-10 mg total) by mouth 3 (three) times daily as needed for muscle spasms (Take with Naproxen; Migraine or other pain.).  Dispense: 60 tablet; Refill: 1   Subjective:    Outpatient Medications Prior to Visit  Medication Sig Dispense Refill   albuterol (VENTOLIN HFA) 108 (90 Base) MCG/ACT inhaler TAKE 2 PUFFS BY MOUTH EVERY 6 HOURS AS NEEDED FOR WHEEZE OR SHORTNESS OF BREATH 8.5 each 5   Cholecalciferol (VITAMIN D3) 1.25 MG (50000 UT) CAPS Take 1 capsule by mouth once a week. Then start a daily OTC Vitamin D3 up to 2,000units. 12 capsule 2   aspirin-acetaminophen-caffeine (EXCEDRIN MIGRAINE) 250-250-65 MG tablet Take by mouth every 6 (six) hours as needed for headache. (Patient not taking: Reported on 08/27/2022)     pantoprazole (PROTONIX) 20 MG tablet Take 1 tablet (20 mg total) by mouth daily. Take 1 pill twice a day for 2 weeks, then 1 pill every  morning. (Patient not taking: Reported on 08/27/2022) 45 tablet 3   SUMAtriptan (IMITREX) 50 MG tablet Take 1 tablet (50 mg total) by mouth every 2 (two) hours as needed for migraine (Max dose of 4 pills in 24 hours). Take first dose with 1 Naproxen. (Patient not taking: Reported on 08/27/2022) 20 tablet 2   naproxen (NAPROSYN) 500 MG tablet Take 1 tablet (500 mg total) by mouth as directed. Take 1 tab with your Imitrex at first sign of Migraine. (Patient not taking: Reported on 08/27/2022) 30 tablet 2   No  facility-administered medications prior to visit.   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   Paresthesia 12/20/2010   Mild   Phlebitis, complicating pregnancy or puerperium    Presence of IVC filter    Rectal pain, chronic 08/11/2012   4/14    Vertigo    Past Surgical History:  Procedure Laterality Date   CESAREAN SECTION     ENDOMETRIAL ABLATION     HERNIA REPAIR     Allergies  Allergen Reactions   Blueberry [Vaccinium Angustifolium] Anaphylaxis   Grapeseed Extract [Nutritional Supplements] Anaphylaxis    Reaction to muscadines    Lyrica [Pregabalin] Swelling    Body and throat swell   Oxycodone Itching and Nausea And Vomiting   Topiramate     REACTION: confusion-pt thinks  made her confused   Hydrocodone-Acetaminophen Rash and Hives      Objective:    Physical Exam Vitals and nursing note reviewed.  Constitutional:      Appearance: Normal appearance.  Cardiovascular:     Rate and Rhythm: Normal rate and regular rhythm.  Pulmonary:     Effort: Pulmonary effort is normal.     Breath sounds: Normal breath sounds.  Musculoskeletal:        General: Normal range of motion.  Skin:    General: Skin is warm and dry.  Neurological:     Mental Status: She is alert.  Psychiatric:        Mood and Affect: Mood normal.        Behavior: Behavior normal.    BP 138/87 (BP Location: Left Arm, Patient Position: Sitting, Cuff Size: Normal)   Pulse 75   Temp 98 F (36.7 C) (Temporal)   Ht  (1.575 m)   Wt 131  lb 3.2 oz (59.5 kg)   SpO2 98%   BMI 24.00 kg/m  Wt Readings from Last 3 Encounters:  08/27/22 131 lb 3.2 oz (59.5 kg)  05/28/22 134 lb 4 oz (60.9 kg)  04/11/22 133 lb 8 oz (60.6 kg)      Dulce Sellar, NP

## 2022-10-03 ENCOUNTER — Ambulatory Visit: Payer: Medicaid Other | Admitting: Physician Assistant

## 2022-10-03 ENCOUNTER — Encounter: Payer: Self-pay | Admitting: Family

## 2022-10-03 ENCOUNTER — Ambulatory Visit (INDEPENDENT_AMBULATORY_CARE_PROVIDER_SITE_OTHER): Payer: 59 | Admitting: Family

## 2022-10-03 ENCOUNTER — Ambulatory Visit: Payer: Medicaid Other | Admitting: Family Medicine

## 2022-10-03 VITALS — BP 122/80 | HR 85 | Ht 62.0 in | Wt 129.8 lb

## 2022-10-03 DIAGNOSIS — H5789 Other specified disorders of eye and adnexa: Secondary | ICD-10-CM

## 2022-10-03 MED ORDER — AZELASTINE HCL 0.05 % OP SOLN: 1.0000 [drp] | Freq: Two times a day (BID) | OPHTHALMIC | 0 refills | Status: DC

## 2022-10-03 NOTE — Progress Notes (Signed)
Bailey Patterson is a 62 y.o. female with the following history as recorded in [employer]:  Patient Active Problem List   Diagnosis Date Noted   Fibromyalgia 08/27/2022   Sleep disorder 05/28/2022   Gastroesophageal reflux disease without esophagitis 04/11/2022   Vitamin D deficiency 12/27/2021   Postmenopausal bleeding 12/27/2021   Mild intermittent asthma without complication 12/26/2021   History of COVID-19 02/02/2020   Chronic migraine without aura, with intractable migraine, so stated, with status migrainosus 07/16/2017    Current Outpatient Medications  Medication Sig Dispense Refill   albuterol (VENTOLIN HFA) 108 (90 Base) MCG/ACT inhaler TAKE 2 PUFFS BY MOUTH EVERY 6 HOURS AS NEEDED FOR WHEEZE OR SHORTNESS OF BREATH 8.5 each 5   azelastine (OPTIVAR) 0.05 % ophthalmic solution Place 1 drop into both eyes 2 (two) times daily. 6 mL 0   pantoprazole (PROTONIX) 20 MG tablet Take 1 tablet (20 mg total) by mouth daily. Take 1 pill twice a day for 2 weeks, then 1 pill every morning. 45 tablet 3   SUMAtriptan (IMITREX) 50 MG tablet Take 1 tablet (50 mg total) by mouth every 2 (two) hours as needed for migraine (Max dose of 4 pills in 24 hours). Take first dose with 1 Naproxen. 20 tablet 2   aspirin-acetaminophen-caffeine (EXCEDRIN MIGRAINE) 250-250-65 MG tablet Take by mouth every 6 (six) hours as needed for headache. (Patient not taking: Reported on 08/27/2022)     Cholecalciferol (VITAMIN D3) 1.25 MG (50000 UT) CAPS Take 1 capsule by mouth once a week. Then start a daily OTC Vitamin D3 up to 2,000units. 12 capsule 2   cyclobenzaprine (FLEXERIL) 5 MG tablet Take 1-2 tablets (5-10 mg total) by mouth 3 (three) times daily as needed for muscle spasms (Take with Naproxen; Migraine or other pain.). (Patient not taking: Reported on 10/03/2022) 60 tablet 1   naproxen (NAPROSYN) 500 MG tablet Take 1 tablet (500 mg total) by mouth 3 (three) times daily as needed for moderate pain or headache (with meals).  Take with 1 Flexeril for migraine or muscle spasm pain. (Patient not taking: Reported on 10/03/2022) 60 tablet 2   No current facility-administered medications for this visit.    Allergies: Blueberry [vaccinium angustifolium], Grapeseed extract [nutritional supplements], Lyrica [pregabalin], Oxycodone, Topiramate, and Hydrocodone-acetaminophen  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   Paresthesia 12/20/2010   Mild   Phlebitis, complicating pregnancy or puerperium    Presence of IVC filter    Rectal pain, chronic 08/11/2012   4/14    Vertigo     Past Surgical History:  Procedure Laterality Date   CESAREAN SECTION     ENDOMETRIAL ABLATION     HERNIA REPAIR      Family History  Problem Relation Age of Onset   Diabetes Mother    Heart disease Mother    Kidney disease Mother    Hypertension Mother    Stroke Mother    Cervical cancer Mother    Diabetes Other    Hypertension Other  Kidney disease Other    Cancer Father     Social History   Tobacco Use   Smoking status: Never   Smokeless tobacco: Never  Substance Use Topics   Alcohol use: No    Subjective:   Seen at urgent care appointment today- notes her eyes have been red x 6 weeks; limited relief with OTC eye drops including Visine or Refresh; denies any vision changes or drainage; has not seen her eye doctor in over a year; was concerned about her blood pressure and pressure in her eyes;      Objective:  Vitals:   10/03/22 1256  BP: 122/80  Pulse: 85   SpO2: 98%  Weight: 129 lb 12.8 oz (58.9 kg)  Height: 5\' 2"  (1.575 m)    General: Well developed, well nourished, in no acute distress  Skin : Warm and dry.  Head: Normocephalic and atraumatic  Eyes: Sclera and conjunctiva clear; pupils round and reactive to light; extraocular movements intact  Lungs: Respirations unlabored;  Neurologic: Alert and oriented; speech intact; face symmetrical; moves all extremities well; CNII-XII intact without focal deficit   Assessment:  1. Eye redness     Plan:  ? Allergic component; due to length of time symptoms have been present, patient is recommended to see eye doctor- she has one in [place] but is unsure about her health insurance status at time of OV; will update referral and she will call back once her health insurance is corrected so referral can be done.   No follow-ups on file.  Orders Placed This Encounter  Procedures   Ambulatory referral to Optometry    Referral Priority:   Routine    Referral Type:   Vision Training and development officer)    Referral Reason:   Specialty Services Required    Requested Specialty:   Optometry    Number of Visits Requested:   1    Requested Prescriptions   Signed Prescriptions Disp Refills   azelastine (OPTIVAR) 0.05 % ophthalmic solution 6 mL 0    Sig: Place 1 drop into both eyes 2 (two) times daily.

## 2022-10-08 ENCOUNTER — Other Ambulatory Visit: Payer: Self-pay | Admitting: Family

## 2022-10-08 DIAGNOSIS — E559 Vitamin D deficiency, unspecified: Secondary | ICD-10-CM

## 2022-10-08 DIAGNOSIS — H16042 Marginal corneal ulcer, left eye: Secondary | ICD-10-CM | POA: Diagnosis not present

## 2022-10-08 DIAGNOSIS — H16143 Punctate keratitis, bilateral: Secondary | ICD-10-CM | POA: Diagnosis not present

## 2022-10-13 ENCOUNTER — Ambulatory Visit (INDEPENDENT_AMBULATORY_CARE_PROVIDER_SITE_OTHER): Payer: 59 | Admitting: Family

## 2022-10-13 ENCOUNTER — Encounter: Payer: Self-pay | Admitting: Family

## 2022-10-13 VITALS — BP 138/80 | HR 98 | Temp 98.2°F | Ht 62.0 in | Wt 132.0 lb

## 2022-10-13 DIAGNOSIS — K5909 Other constipation: Secondary | ICD-10-CM | POA: Diagnosis not present

## 2022-10-13 DIAGNOSIS — G479 Sleep disorder, unspecified: Secondary | ICD-10-CM | POA: Diagnosis not present

## 2022-10-13 DIAGNOSIS — R5383 Other fatigue: Secondary | ICD-10-CM

## 2022-10-13 DIAGNOSIS — E559 Vitamin D deficiency, unspecified: Secondary | ICD-10-CM

## 2022-10-13 MED ORDER — LINACLOTIDE 72 MCG PO CAPS: 72.0000 ug | ORAL_CAPSULE | Freq: Every day | ORAL | 1 refills | Status: DC

## 2022-10-13 MED ORDER — TRAZODONE HCL 50 MG PO TABS
25.0000 mg | ORAL_TABLET | Freq: Every evening | ORAL | 3 refills | Status: DC | PRN
Start: 2022-10-13 — End: 2022-11-05

## 2022-10-13 NOTE — Progress Notes (Signed)
Patient ID: Bailey Patterson, female    DOB: Jan 28, 1961, 62 y.o.   MRN: [id number]  Chief Complaint  Patient presents with   Fatigue   HPI: Fatigue:  still persisting, pt denies smoking or alcohol use. Labs checked in 12/2021 just indicated low Vitamin D, and she reports she is taking this weekly but still fatigued. Reports falling asleep ok, but does not stay asleep. Wakes up for a few hours in middle of the night. Has cut back on her caffeine intake and is no longer trying to sleep with the TV on, using a calming music instead.  Constipation: pt reports having a long hx of off & on constipation, currently having bloating and pain on LLQ of abd, reports last BM 2 d ago but hard, small balls. Does not drink enough water, states she used to be on Linzess in the past. Reports she does not like the taste of Miralax powder.  Insomnia: pt reports taking the Flexeril helps her fall asleep but she wake back up after a few hours later same as before. Has stated in past that she does not like taking sleep medications.    Assessment & Plan:  Sleep disorder Assessment & Plan: New - r/t chronic fatigue pt reports making changes to her sleep routine, but still having trouble discussed med options, pt willing to try low dose of Trazodone, advised on use & SE f/u 3 mos or prn  Orders: -     traZODone HCl; Take 0.5-1 tablets (25-50 mg total) by mouth at bedtime as needed for sleep.  Dispense: 30 tablet; Refill: 3  Vitamin D deficiency -     VITAMIN D 25 Hydroxy (Vit-D Deficiency, Fractures)  Other fatigue Assessment & Plan: chronic, r/t Fibromyalgia pt still not sleeping well taking OTC Vitamin D, thinks it is a low dose, fan out of refills of weekly RX supplement checking labs today, starting med for sleep f/u 3 mos or prn  Orders: -     CBC with Differential/Platelet -     TSH -     Comprehensive metabolic panel  Other constipation- advised pt on increasing water intake, refilling Linzess,  pt has taken in past, advised if not covered or copay too high, can try OTC stool softeners, fiber supplements, e.g. Metamucil, increased fiber in diet and exercise.  -     linaCLOtide; Take 1 capsule (72 mcg total) by mouth daily before breakfast.  Dispense: 90 capsule; Refill: 1  Subjective:    Outpatient Medications Prior to Visit  Medication Sig Dispense Refill   albuterol (VENTOLIN HFA) 108 (90 Base) MCG/ACT inhaler TAKE 2 PUFFS BY MOUTH EVERY 6 HOURS AS NEEDED FOR WHEEZE OR SHORTNESS OF BREATH 8.5 each 5   aspirin-acetaminophen-caffeine (EXCEDRIN MIGRAINE) 250-250-65 MG tablet Take by mouth every 6 (six) hours as needed for headache.     Cholecalciferol (VITAMIN D3) 1.25 MG (50000 UT) CAPS Take 1 capsule by mouth once a week. Then start a daily OTC Vitamin D3 up to 2,000units. 12 capsule 2   cyclobenzaprine (FLEXERIL) 5 MG tablet Take 1-2 tablets (5-10 mg total) by mouth 3 (three) times daily as needed for muscle spasms (Take with Naproxen; Migraine or other pain.). 60 tablet 1   naproxen (NAPROSYN) 500 MG tablet Take 1 tablet (500 mg total) by mouth 3 (three) times daily as needed for moderate pain or headache (with meals). Take with 1 Flexeril for migraine or muscle spasm pain. 60 tablet 2   pantoprazole (PROTONIX) 20 MG  tablet Take 1 tablet (20 mg total) by mouth daily. Take 1 pill twice a day for 2 weeks, then 1 pill every morning. 45 tablet 3   SUMAtriptan (IMITREX) 50 MG tablet Take 1 tablet (50 mg total) by mouth every 2 (two) hours as needed for migraine (Max dose of 4 pills in 24 hours). Take first dose with 1 Naproxen. 20 tablet 2   azelastine (OPTIVAR) 0.05 % ophthalmic solution Place 1 drop into both eyes 2 (two) times daily. 6 mL 0   No facility-administered medications prior to visit.   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   Paresthesia 12/20/2010   Mild   Phlebitis, complicating pregnancy or puerperium    Presence of IVC filter    Rectal pain, chronic 08/11/2012   4/14    Vertigo    Past Surgical History:  Procedure Laterality Date   CESAREAN SECTION     ENDOMETRIAL ABLATION     HERNIA REPAIR     Allergies  Allergen Reactions   Blueberry [Vaccinium Angustifolium] Anaphylaxis   Grapeseed Extract [Nutritional Supplements] Anaphylaxis    Reaction to muscadines    Lyrica [Pregabalin] Swelling    Body and throat swell   Oxycodone Itching and Nausea And Vomiting   Topiramate     REACTION: confusion-pt thinks 100mg  made her confused   Hydrocodone-Acetaminophen Rash and Hives      Objective:    Physical Exam Vitals and nursing note reviewed.  Constitutional:      Appearance: Normal appearance.  Cardiovascular:     Rate and Rhythm: Normal rate and regular rhythm.  Pulmonary:     Effort: Pulmonary effort is normal.     Breath sounds: Normal breath sounds.  Abdominal:     General: Abdomen is flat. There is no distension.     Tenderness: There is abdominal tenderness in the periumbilical area and left lower quadrant.     Hernia: No hernia is present.     Comments: firmness noted w/palpation of LLQ and left of umbilicus  Musculoskeletal:        General: Normal range of motion.  Skin:    General: Skin is warm and dry.  Neurological:     Mental Status: She is alert.  Psychiatric:        Mood and Affect: Mood normal.        Behavior: Behavior normal.    BP 138/80   Pulse 98   Temp 98.2 F (36.8 C)   Ht 5\' 2"  (1.575 m)   Wt 132 lb (59.9 kg)   SpO2 99%   BMI 24.14  kg/m  Wt Readings from Last 3 Encounters:  10/13/22 132 lb (59.9 kg)  10/03/22 129 lb 12.8 oz (58.9 kg)  08/27/22 131 lb 3.2 oz (59.5 kg)      Dulce Sellar, NP

## 2022-10-13 NOTE — Assessment & Plan Note (Signed)
New - r/t chronic fatigue pt reports making changes to her sleep routine, but still having trouble discussed med options, pt willing to try low dose of Trazodone, advised on use & SE f/u 3 mos or prn

## 2022-10-13 NOTE — Assessment & Plan Note (Signed)
chronic, r/t Fibromyalgia pt still not sleeping well taking OTC Vitamin D, thinks it is a low dose, fan out of refills of weekly RX supplement checking labs today, starting med for sleep f/u 3 mos or prn

## 2022-10-14 ENCOUNTER — Telehealth: Payer: Self-pay | Admitting: Family

## 2022-10-14 ENCOUNTER — Telehealth: Payer: Self-pay

## 2022-10-14 LAB — CBC WITH DIFFERENTIAL/PLATELET
Basophils Absolute: 0.1 10*3/uL (ref 0.0–0.1)
Basophils Relative: 1.3 % (ref 0.0–3.0)
Eosinophils Absolute: 0.1 10*3/uL (ref 0.0–0.7)
Eosinophils Relative: 1.5 % (ref 0.0–5.0)
HCT: 41.7 % (ref 36.0–46.0)
Hemoglobin: 13.3 g/dL (ref 12.0–15.0)
Lymphocytes Relative: 37.9 % (ref 12.0–46.0)
Lymphs Abs: 1.8 10*3/uL (ref 0.7–4.0)
MCHC: 32 g/dL (ref 30.0–36.0)
MCV: 87.8 fl (ref 78.0–100.0)
Monocytes Absolute: 0.4 10*3/uL (ref 0.1–1.0)
Monocytes Relative: 8.4 % (ref 3.0–12.0)
Neutro Abs: 2.4 10*3/uL (ref 1.4–7.7)
Neutrophils Relative %: 50.9 % (ref 43.0–77.0)
Platelets: 318 10*3/uL (ref 150.0–400.0)
RBC: 4.74 Mil/uL (ref 3.87–5.11)
RDW: 13.9 % (ref 11.5–15.5)
WBC: 4.7 10*3/uL (ref 4.0–10.5)

## 2022-10-14 LAB — COMPREHENSIVE METABOLIC PANEL
ALT: 15 U/L (ref 0–35)
AST: 20 U/L (ref 0–37)
Albumin: 4.4 g/dL (ref 3.5–5.2)
Alkaline Phosphatase: 67 U/L (ref 39–117)
BUN: 15 mg/dL (ref 6–23)
CO2: 26 mEq/L (ref 19–32)
Calcium: 10 mg/dL (ref 8.4–10.5)
Chloride: 107 mEq/L (ref 96–112)
Creatinine, Ser: 0.81 mg/dL (ref 0.40–1.20)
GFR: 78.93 mL/min (ref >60.00)
Glucose, Bld: 92 mg/dL (ref 70–99)
Potassium: 3.8 mEq/L (ref 3.5–5.1)
Sodium: 142 mEq/L (ref 135–145)
Total Bilirubin: 0.3 mg/dL (ref 0.2–1.2)
Total Protein: 7.3 g/dL (ref 6.0–8.3)

## 2022-10-14 LAB — TSH: TSH: 0.96 u[IU]/mL (ref 0.35–5.50)

## 2022-10-14 LAB — VITAMIN D 25 HYDROXY (VIT D DEFICIENCY, FRACTURES): VITD: 101.17 ng/mL (ref 30.00–100.00)

## 2022-10-14 NOTE — Telephone Encounter (Signed)
Clydie Braun from the [facility] called with a critical Vit D of 101.17. I verbally told Dulce Sellar, NP also.

## 2022-10-14 NOTE — Telephone Encounter (Signed)
Patient called stating pharmacy informed her rx order for linzess is not in their system. Can this be resent to [facility] on [address].

## 2022-10-15 ENCOUNTER — Other Ambulatory Visit: Payer: Self-pay

## 2022-10-15 DIAGNOSIS — K5909 Other constipation: Secondary | ICD-10-CM

## 2022-10-15 MED ORDER — LINACLOTIDE 72 MCG PO CAPS
72.0000 ug | ORAL_CAPSULE | Freq: Every day | ORAL | 1 refills | Status: DC
Start: 2022-10-15 — End: 2023-06-18

## 2022-10-15 NOTE — Telephone Encounter (Signed)
Script sent to pharmacy.

## 2022-10-23 ENCOUNTER — Telehealth: Payer: Self-pay

## 2022-10-23 NOTE — Telephone Encounter (Signed)
I called and LVM for pt.

## 2022-10-23 NOTE — Telephone Encounter (Signed)
Linzess approved with [employer] [employer]. Effective: 10/22/22-10/22/23.   Approval letter in media

## 2022-11-03 ENCOUNTER — Telehealth (INDEPENDENT_AMBULATORY_CARE_PROVIDER_SITE_OTHER): Payer: 59 | Admitting: Family

## 2022-11-03 DIAGNOSIS — G479 Sleep disorder, unspecified: Secondary | ICD-10-CM | POA: Diagnosis not present

## 2022-11-03 DIAGNOSIS — R5383 Other fatigue: Secondary | ICD-10-CM

## 2022-11-03 NOTE — Assessment & Plan Note (Signed)
New - r/t chronic fatigue pt reports making changes to her sleep routine taking Trazodone just prn, thinks it has helped some continue to advise on sleep hygiene measures f/u 3 mos or prn

## 2022-11-03 NOTE — Assessment & Plan Note (Signed)
chronic, r/t Fibromyalgia reports taking the Trazodone for sleep prn, seems to help last Vit D check slightly high, pt unsure of her OTC dose, advised to not take more than 5,000units daily, prefer Vit. D3 vs D2 advised can also take OTC Vitamin B complex with a mix of all the B vitamins - energy vitamins continue to advise on getting enough water in every day, exercise (cardio) at least as many days as possible, low sugar/carb diet f/u 3 mos or prn

## 2022-11-03 NOTE — Progress Notes (Signed)
MyChart Video Visit    Virtual Visit via Video Note   This format is felt to be most appropriate for this patient at this time. Physical exam was limited by quality of the video and audio technology used for the visit. CMA was able to get the patient set up on a video visit.  Patient location: Home. Patient and provider in visit Provider location: Office  I discussed the limitations of evaluation and management by telemedicine and the availability of in person appointments. The patient expressed understanding and agreed to proceed.  Visit Date: 11/03/2022  Today's healthcare provider: Dulce Sellar, NP     Subjective:   Patient ID: Bailey Patterson, female    DOB: [birth year], 62 y.o.   MRN: [id number]  Chief Complaint  Patient presents with   Fatigue   HPI Fatigue:  still persisting, pt denies smoking or alcohol use. Labs checked in 12/2021 just indicated low Vitamin D, and she reports she is taking this weekly but still fatigued. Reports falling asleep ok, but does not stay asleep. Wakes up for a few hours in middle of the night. Has cut back on her caffeine intake and is no longer trying to sleep with the TV on, using a calming music instead. *Last visit all labs wnl, Vitamin D slightly high, advised to hold OTC for a few days.  Insomnia: pt reports taking the Flexeril helps her fall asleep but she wake back up after a few hours later same as before. Has stated in past that she does not like taking sleep medications. *Last visit given low dose Trazodone to try, thinks it has helped a little, does not take every night.  Assessment & Plan:  Other fatigue Assessment & Plan: chronic, r/t Fibromyalgia reports taking the Trazodone for sleep prn, seems to help last Vit D check slightly high, pt unsure of her OTC dose, advised to not take more than 5,000units daily, prefer Vit. D3 vs D2 advised can also take OTC Vitamin B complex with a mix of all the B vitamins - energy  vitamins continue to advise on getting enough water in every day, exercise (cardio) at least as many days as possible, low sugar/carb diet f/u 3 mos or prn   Sleep disorder Assessment & Plan: New - r/t chronic fatigue pt reports making changes to her sleep routine taking Trazodone just prn, thinks it has helped some continue to advise on sleep hygiene measures f/u 3 mos or prn    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   Paresthesia 12/20/2010   Mild   Phlebitis, complicating pregnancy or puerperium    Presence of IVC filter    Rectal pain, chronic 08/11/2012   4/14    Vertigo     Past Surgical History:  Procedure Laterality Date   CESAREAN SECTION     ENDOMETRIAL ABLATION     HERNIA REPAIR      Outpatient Medications Prior to Visit  Medication Sig Dispense Refill   albuterol (  VENTOLIN HFA) 108 (90 Base) MCG/ACT inhaler TAKE 2 PUFFS BY MOUTH EVERY 6 HOURS AS NEEDED FOR WHEEZE OR SHORTNESS OF BREATH 8.5 each 5   aspirin-acetaminophen-caffeine (EXCEDRIN MIGRAINE) 250-250-65 MG tablet Take by mouth every 6 (six) hours as needed for headache.     Cholecalciferol (VITAMIN D3) 1.25 MG (50000 UT) CAPS Take 1 capsule by mouth once a week. Then start a daily OTC Vitamin D3 up to 2,000units. 12 capsule 2   cyclobenzaprine (FLEXERIL) 5 MG tablet Take 1-2 tablets (5-10 mg total) by mouth 3 (three) times daily as needed for muscle spasms (Take with Naproxen; Migraine or other pain.). 60 tablet 1   linaclotide (LINZESS)  72 MCG capsule Take 1 capsule (72 mcg total) by mouth daily before breakfast. 90 capsule 1   naproxen (NAPROSYN) 500 MG tablet Take 1 tablet (500 mg total) by mouth 3 (three) times daily as needed for moderate pain or headache (with meals). Take with 1 Flexeril for migraine or muscle spasm pain. 60 tablet 2   pantoprazole (PROTONIX) 20 MG tablet Take 1 tablet (20 mg total) by mouth daily. Take 1 pill twice a day for 2 weeks, then 1 pill every morning. 45 tablet 3   SUMAtriptan (IMITREX) 50 MG tablet Take 1 tablet (50 mg total) by mouth every 2 (two) hours as needed for migraine (Max dose of 4 pills in 24 hours). Take first dose with 1 Naproxen. 20 tablet 2   traZODone (DESYREL) 50 MG tablet Take 0.5-1 tablets (25-50 mg total) by mouth at bedtime as needed for sleep. 30 tablet 3   No facility-administered medications prior to visit.    Allergies  Allergen Reactions   Blueberry [Vaccinium Angustifolium] Anaphylaxis   Grapeseed Extract [Nutritional Supplements] Anaphylaxis    Reaction to muscadines    Lyrica [Pregabalin] Swelling    Body and throat swell   Oxycodone Itching and Nausea And Vomiting   Topiramate     REACTION: confusion-pt thinks 100mg  made her confused   Hydrocodone-Acetaminophen Rash and Hives       Objective:   Physical Exam Vitals and nursing note reviewed.  Constitutional:      General: Pt is not in acute distress.    Appearance: Normal appearance.  HENT:     Head: Normocephalic.  Pulmonary:     Effort: No respiratory distress.  Musculoskeletal:     Cervical back: Normal range of motion.  Skin:    General: Skin is dry.     Coloration: Skin is not pale.  Neurological:     Mental Status: Pt is alert and oriented to person, place, and time.  Psychiatric:        Mood and Affect: Mood normal.   There were no vitals taken for this visit.  Wt Readings from Last 3 Encounters:  10/13/22 132 lb (59.9 kg)  10/03/22 129 lb 12.8 oz (58.9 kg)  08/27/22 131 lb 3.2  oz (59.5 kg)        I discussed the assessment and treatment plan with the patient. The patient was provided an opportunity to ask questions and all were answered. The patient agreed with the plan and demonstrated an understanding of the instructions.   The patient was advised to call back or seek an in-person evaluation if the symptoms worsen or if the condition fails to improve as anticipated.  Dulce Sellar, NP [facility] [phone] (phone) [phone] (fax)  [facility]

## 2022-11-05 ENCOUNTER — Other Ambulatory Visit: Payer: Self-pay | Admitting: Family

## 2022-11-05 DIAGNOSIS — G479 Sleep disorder, unspecified: Secondary | ICD-10-CM

## 2022-11-27 ENCOUNTER — Ambulatory Visit: Payer: 59 | Admitting: Family

## 2022-11-27 ENCOUNTER — Ambulatory Visit (INDEPENDENT_AMBULATORY_CARE_PROVIDER_SITE_OTHER): Payer: 59 | Admitting: Nurse Practitioner

## 2022-11-27 ENCOUNTER — Encounter: Payer: Self-pay | Admitting: Nurse Practitioner

## 2022-11-27 VITALS — BP 144/96 | HR 89 | Temp 98.2°F | Wt 130.5 lb

## 2022-11-27 DIAGNOSIS — J4520 Mild intermittent asthma, uncomplicated: Secondary | ICD-10-CM

## 2022-11-27 DIAGNOSIS — U071 COVID-19: Secondary | ICD-10-CM

## 2022-11-27 HISTORY — DX: COVID-19: U07.1

## 2022-11-27 LAB — POC COVID19 BINAXNOW: SARS Coronavirus 2 Ag: POSITIVE — AB

## 2022-11-27 LAB — POCT INFLUENZA A/B
Influenza A, POC: NEGATIVE
Influenza B, POC: NEGATIVE

## 2022-11-27 LAB — POCT RESPIRATORY SYNCYTIAL VIRUS: RSV Rapid Ag: NEGATIVE

## 2022-11-27 LAB — POCT RAPID STREP A (OFFICE): Rapid Strep A Screen: NEGATIVE

## 2022-11-27 MED ORDER — NIRMATRELVIR/RITONAVIR (PAXLOVID)TABLET
3.0000 | ORAL_TABLET | Freq: Two times a day (BID) | ORAL | 0 refills | Status: AC
Start: 2022-11-27 — End: 2022-12-02

## 2022-11-27 MED ORDER — BENZONATATE 100 MG PO CAPS: 100.0000 mg | ORAL_CAPSULE | Freq: Two times a day (BID) | ORAL | 0 refills | Status: DC | PRN

## 2022-11-27 MED ORDER — ALBUTEROL SULFATE HFA 108 (90 BASE) MCG/ACT IN AERS
1.0000 | INHALATION_SPRAY | Freq: Four times a day (QID) | RESPIRATORY_TRACT | 5 refills | Status: AC | PRN
Start: 2022-11-27 — End: ?

## 2022-11-27 NOTE — Assessment & Plan Note (Signed)
Acute Symptoms remain mild.  Vital signs stable Per shared decision making patient would like to take course of Paxlovid, last EGFR greater than 60. Prescription sent to pharmacy in addition to refill of albuterol inhaler and Tessalon Perles that patient can take as needed for cough suppressant.  She was encouraged to continue using Mucinex as well. Patient educated to not take trazodone while on Paxlovid, but that she can restart trazodone once Paxlovid has been completed. She was given work note to stay out of work for the next 5 days until she has completed the [employer] course. She was told to call 911 if symptoms worsen especially if she has high fever or shortness of breath.  She reports her understanding.

## 2022-11-27 NOTE — Progress Notes (Signed)
Established Patient Office Visit  Subjective   Patient ID: Bailey Patterson, female    DOB: 1961-02-28  Age: 62 y.o. MRN: [id number]  Chief Complaint  Patient presents with   Cough    Symptom onset 4 days ago. Has cough, mild shortness of breath. Has been taking mucinex. Arrives for further evaluation.     ROS: See HPI    Objective:     BP (!) 144/96   Pulse 89   Temp 98.2 F (36.8 C) (Temporal)   Wt 130 lb 8 oz (59.2 kg)   SpO2 95%   BMI 23.87 kg/m    Physical Exam Vitals reviewed.  Constitutional:      General: She is not in acute distress.    Appearance: Normal appearance.  HENT:     Head: Normocephalic and atraumatic.  Neck:     Vascular: No carotid bruit.  Cardiovascular:     Rate and Rhythm: Normal rate and regular rhythm.     Pulses: Normal pulses.     Heart sounds: Normal heart sounds.  Pulmonary:     Effort: Pulmonary effort is normal.     Breath sounds: Normal breath sounds.  Skin:    General: Skin is warm and dry.  Neurological:     General: No focal deficit present.     Mental Status: She is alert and oriented to person, place, and time.  Psychiatric:        Mood and Affect: Mood normal.        Behavior: Behavior normal.        Judgment: Judgment normal.      Results for orders placed or performed in visit on 11/27/22  POC COVID-19  Result Value Ref Range   SARS Coronavirus 2 Ag Positive (A) Negative  POCT rapid strep A  Result Value Ref Range   Rapid Strep A Screen Negative Negative  POCT Influenza A/B  Result Value Ref Range   Influenza A, POC Negative Negative   Influenza B, POC Negative Negative  POCT respiratory syncytial virus  Result Value Ref Range   RSV Rapid Ag neg       The 10-year ASCVD risk score (Arnett DK, et al., 2019) is: 6.5%    Assessment & Plan:   Problem List Items Addressed This Visit       Respiratory   Mild intermittent asthma without complication   Relevant Medications   albuterol (VENTOLIN  HFA) 108 (90 Base) MCG/ACT inhaler     Other   COVID-19 - Primary    Acute Symptoms remain mild.  Vital signs stable Per shared decision making patient would like to take course of Paxlovid, last EGFR greater than 60. Prescription sent to pharmacy in addition to refill of albuterol inhaler and Tessalon Perles that patient can take as needed for cough suppressant.  She was encouraged to continue using Mucinex as well. Patient educated to not take trazodone while on Paxlovid, but that she can restart trazodone once Paxlovid has been completed. She was given work note to stay out of work for the next 5 days until she has completed the [employer] course. She was told to call 911 if symptoms worsen especially if she has high fever or shortness of breath.  She reports her understanding.      Relevant Medications   albuterol (VENTOLIN HFA) 108 (90 Base) MCG/ACT inhaler   benzonatate (TESSALON) 100 MG capsule   nirmatrelvir/ritonavir (PAXLOVID) 20 x 150 MG & 10 x 100MG  TABS  Other Relevant Orders   POC COVID-19 (Completed)   POCT rapid strep A (Completed)   POCT Influenza A/B (Completed)   POCT respiratory syncytial virus (Completed)    Return if symptoms worsen or fail to improve.    Elenore Paddy, NP

## 2022-11-27 NOTE — Patient Instructions (Signed)
Do not take the trazodone while you are on Paxlovid Please call 911 if your symptoms worsen especially if you have shortness of breath or high fever (>102) If you have any questions please call us or call Dulce Sellar, NP's office.

## 2022-12-02 ENCOUNTER — Ambulatory Visit: Payer: 59 | Admitting: Family Medicine

## 2022-12-10 ENCOUNTER — Other Ambulatory Visit: Payer: Self-pay | Admitting: Family

## 2022-12-10 ENCOUNTER — Ambulatory Visit (INDEPENDENT_AMBULATORY_CARE_PROVIDER_SITE_OTHER): Payer: 59 | Admitting: Family

## 2022-12-10 VITALS — BP 116/79 | HR 78 | Ht 62.0 in | Wt 128.0 lb

## 2022-12-10 DIAGNOSIS — E559 Vitamin D deficiency, unspecified: Secondary | ICD-10-CM

## 2022-12-10 DIAGNOSIS — M797 Fibromyalgia: Secondary | ICD-10-CM

## 2022-12-10 MED ORDER — PREDNISONE 10 MG PO TABS: ORAL_TABLET | ORAL | 0 refills | Status: DC

## 2022-12-10 NOTE — Patient Instructions (Signed)
It was very nice to see you today!   I have sent a prednisone dose pack to start tomorrow morning.  Take after eating breakfast for 5 days. Do NOT take Naproxen, Aleve, Advil, Ibuprofen or Aspirin while on the prednisone. You can take the Cyclobenzaprine (Flexeril) up to 3 times per day to help with pain.  Apply heat to your shoulders for up to 30 minutes 3 times daily to reduce pain. Stretch your neck and shoulders often during the day to reduce tension. This is the area where we hold stress/tension and worsening pain.       PLEASE NOTE:  If you had any lab tests please let us know if you have not heard back within a few days. You may see your results on MyChart before we have a chance to review them but we will give you a call once they are reviewed by [place]. If we ordered any referrals today, please let us know if you have not heard from their office within the next week.

## 2022-12-10 NOTE — Progress Notes (Signed)
Patient ID: Bailey Patterson, female    DOB: Apr 23, 1961, 62 y.o.   MRN: [id number]  Chief Complaint  Patient presents with   Tingling    Pt c/o swelling in bilateral shoulder and tingling sensations all over and finger swelling. Present for a week, Has tried massaging area.    HPI:      Shoulder tingling:  feels tingling and burning at times in her right neck and bilateral trapezius-started over a week ago. Pt reports more stress r/t son-in-law on dialysis. Has hx of fibromyalgia and states her sx feel like when she has had a flare up in the past.  Assessment & Plan:  1. Fibromyalgia affecting shoulder region sending pred dose pack, advised on use & SE, start tomorrow am. Ok to take with Flexeril (already has RX) tid prn, but do not take NSAIDs while on the prednisone. Apply heat up to tid prn or OTC analgesic cream/patches tid prn. Advised on stretching neck and shoulders often during the day to relax & relieve tension.  - predniSONE (DELTASONE) 10 MG tablet; Take in the morning with breakfast. 6 tab day 1, 5 tab day 2-3, 4 tab day 4, 3 tab day 5, 2 tab day 6  Dispense: 25 tablet; Refill: 0   Subjective:    Outpatient Medications Prior to Visit  Medication Sig Dispense Refill   albuterol (VENTOLIN HFA) 108 (90 Base) MCG/ACT inhaler Inhale 1-2 puffs into the lungs every 6 (six) hours as needed for wheezing or shortness of breath. 8.5 each 5   aspirin-acetaminophen-caffeine (EXCEDRIN MIGRAINE) 250-250-65 MG tablet Take by mouth every 6 (six) hours as needed for headache.     Cholecalciferol (VITAMIN D3) 1.25 MG (50000 UT) CAPS Take 1 capsule by mouth once a week. Then start a daily OTC Vitamin D3 up to 2,000units. 12 capsule 2   cyclobenzaprine (FLEXERIL) 5 MG tablet Take 1-2 tablets (5-10 mg total) by mouth 3 (three) times daily as needed for muscle spasms (Take with Naproxen; Migraine or other pain.). 60 tablet 1   naproxen (NAPROSYN) 500 MG tablet Take 1 tablet (500 mg total) by  mouth 3 (three) times daily as needed for moderate pain or headache (with meals). Take with 1 Flexeril for migraine or muscle spasm pain. 60 tablet 2   pantoprazole (PROTONIX) 20 MG tablet Take 1 tablet (20 mg total) by mouth daily. Take 1 pill twice a day for 2 weeks, then 1 pill every morning. 45 tablet 3   SUMAtriptan (IMITREX) 50 MG tablet Take 1 tablet (50 mg total) by mouth every 2 (two) hours as needed for migraine (Max dose of 4 pills in 24 hours). Take first dose with 1 Naproxen. 20 tablet 2   traZODone (DESYREL) 50 MG tablet TAKE 0.5-1 TABLET BY MOUTH AT BEDTIME AS NEEDED FOR SLEEP. 90 tablet 2   benzonatate (TESSALON) 100 MG capsule Take 1 capsule (100 mg total) by mouth 2 (two) times daily as needed for cough. 20 capsule 0   linaclotide (LINZESS) 72 MCG capsule Take 1 capsule (72 mcg total) by mouth daily before breakfast. (Patient not taking: Reported on 12/10/2022) 90 capsule 1   No facility-administered medications prior to visit.   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   Paresthesia 12/20/2010   Mild   Phlebitis, complicating pregnancy or puerperium    Presence of IVC filter    Rectal pain, chronic 08/11/2012   4/14    Vertigo    Past Surgical History:  Procedure Laterality Date   CESAREAN SECTION     ENDOMETRIAL ABLATION     HERNIA REPAIR     Allergies  Allergen Reactions   Blueberry [Vaccinium Angustifolium] Anaphylaxis   Grapeseed Extract [Nutritional Supplements] Anaphylaxis    Reaction to muscadines     Lyrica [Pregabalin] Swelling    Body and throat swell   Oxycodone Itching and Nausea And Vomiting   Topiramate     REACTION: confusion-pt thinks 100mg  made her confused   Hydrocodone-Acetaminophen Rash and Hives      Objective:    Physical Exam Vitals and nursing note reviewed.  Constitutional:      Appearance: Normal appearance.  Neck:   Cardiovascular:     Rate and Rhythm: Normal rate and regular rhythm.  Pulmonary:     Effort: Pulmonary effort is normal.     Breath sounds: Normal breath sounds.  Musculoskeletal:        General: Normal range of motion.     Right shoulder: Swelling (mild, trapezius region) and tenderness (mild, trapezius region) present.     Left shoulder: Swelling (mild, trapezius region) and tenderness (mild, trapezius region) present.     Cervical back: Pain with movement (mild) and muscular tenderness present.  Skin:    General: Skin is warm and dry.  Neurological:     Mental Status: She is alert.  Psychiatric:        Mood and Affect: Mood normal.        Behavior: Behavior normal.    BP 116/79   Pulse 78   Ht 5\' 2"  (1.575 m)   Wt 128 lb (58.1 kg)   SpO2 99%   BMI 23.41 kg/m  Wt Readings from Last 3 Encounters:  12/10/22 128 lb (58.1 kg)  11/27/22 130 lb 8 oz (59.2 kg)  10/13/22 132 lb (59.9 kg)       Dulce Sellar, NP

## 2022-12-28 ENCOUNTER — Other Ambulatory Visit: Payer: Self-pay | Admitting: Family

## 2022-12-28 DIAGNOSIS — E559 Vitamin D deficiency, unspecified: Secondary | ICD-10-CM

## 2023-06-18 ENCOUNTER — Ambulatory Visit: Payer: 59 | Admitting: Family

## 2023-06-18 ENCOUNTER — Encounter: Payer: Self-pay | Admitting: Family

## 2023-06-18 VITALS — BP 124/77 | HR 93 | Temp 97.6°F | Ht 62.0 in | Wt 134.6 lb

## 2023-06-18 DIAGNOSIS — R059 Cough, unspecified: Secondary | ICD-10-CM | POA: Diagnosis not present

## 2023-06-18 DIAGNOSIS — K5909 Other constipation: Secondary | ICD-10-CM | POA: Diagnosis not present

## 2023-06-18 DIAGNOSIS — K648 Other hemorrhoids: Secondary | ICD-10-CM

## 2023-06-18 DIAGNOSIS — E559 Vitamin D deficiency, unspecified: Secondary | ICD-10-CM

## 2023-06-18 LAB — POC COVID19 BINAXNOW: SARS Coronavirus 2 Ag: NEGATIVE

## 2023-06-18 LAB — POCT RAPID STREP A (OFFICE): Rapid Strep A Screen: NEGATIVE

## 2023-06-18 LAB — POCT INFLUENZA A/B
Influenza A, POC: NEGATIVE
Influenza B, POC: NEGATIVE

## 2023-06-18 MED ORDER — HYDROCORTISONE ACETATE 25 MG RE SUPP: 25.0000 mg | Freq: Two times a day (BID) | RECTAL | 0 refills | Status: DC

## 2023-06-18 MED ORDER — HYDROCORTISONE (PERIANAL) 1 % EX CREA: 1.0000 | TOPICAL_CREAM | Freq: Two times a day (BID) | CUTANEOUS | 1 refills | Status: DC

## 2023-06-18 MED ORDER — PROMETHAZINE-DM 6.25-15 MG/5ML PO SYRP: 5.0000 mL | ORAL_SOLUTION | Freq: Every evening | ORAL | 0 refills | Status: DC | PRN

## 2023-06-18 NOTE — Patient Instructions (Signed)
 It was very nice to see you today!    I have sent over the hemorrhoid cream and suppositories to your pharmacy, if insurance doesn't cover - it is on the shelf, look for generic Preparation H suppositories & cream.  You tested negative for flu, covid and strep.  Drink at least 2 liters of water every day! Take extra fiber and ok to take 1-2 generic stool softeners daily.  Take 1-2 generic Aleve  twice a day for next few days to help you feel better. Gargle with warm salt water for throat.     PLEASE NOTE:  If you had any lab tests please let us  know if you have not heard back within a few days. You may see your results on MyChart before we have a chance to review them but we will give you a call once they are reviewed by us . If we ordered any referrals today, please let us  know if you have not heard from their office within the next week.

## 2023-06-18 NOTE — Assessment & Plan Note (Signed)
 Previous history of vitamin D  deficiency. Currently not taking vitamin D  supplements. -Advise resumption of over-the-counter vitamin D  supplements, up to 5000 units daily.

## 2023-06-18 NOTE — Assessment & Plan Note (Signed)
 Patient reports hard, small stools. No recent use of prescribed Linzess  due to insurance coverage issues. -Advise increased water and fiber intake. -Recommend over-the-counter stool softeners, 1-2/day, avoiding those with added laxatives. -Reminded pt on taking OTC Magnesium oxide daily to assist daily BM. -Must drink at least 2 liters water daily for stool softeners to work.

## 2023-06-18 NOTE — Progress Notes (Signed)
 Patient ID: Bailey Patterson, female    DOB: 23-Apr-1961, 63 y.o.   MRN: [id number]  Chief Complaint  Patient presents with   Sinus Problem    Pt c/o headache, sore throat and cough, present since yesterday. Has tried mucinex and dayquil which did help slightly.    Hemorrhoids    Pt c/o external hemorrhoids. Present for a couple of days.        Discussed the use of AI scribe software for clinical note transcription with the patient, who gave verbal consent to proceed.  History of Present Illness   Bailey Patterson is a 63 year old female who presents with cold symptoms and hemorrhoids.  She began experiencing cold symptoms last night, including a scratchy throat and cough. She has been using Mucinex but finds it ineffective for her cough. No fever is present, but she notes throat redness and swelling, which she attributes to allergies.  She also is experiencing what she thinks is an external hemorrhoid with mild itching but no significant pain. The hemorrhoid is associated with straining during bowel movements and a history of constipation. She describes her stools as 'little balls' and acknowledges the need for increased water and fiber intake. She has not been able to obtain Linzess  due to insurance issues but has Miralax at home but reports this causes stomach discomfort. She also has Dulcolax, but has not taken this. She previously took RX vitamin D  with OTC Vitamin D  but stopped both due to concerns about overloading. She has vitamin D  at home.      Assessment & Plan:     Viral syndrome - Recent onset of cold symptoms with sore throat. Negative for flu and COVID. -Advised on warm salt water gargles for sore throat. -Recommend over-the-counter cough lozenges and sending RX cough syrup, promethazine  for cough, advised on use & SE. -Advised on 1-2 generic Aleve  twice daily for a few days for inflammation, body aches, sore throat and fever. -Encourage increased water intake, 2 liters  daily. -Take extra zinc and Vitamin C while having sx to boost immunity.  External Hemorrhoid - Small hemorrhoid, approx 1.5cm in diameter noted, no active bleeding. Reported constipation. -Sending Anusol  suppositories to pharmacy, use 1 bid for up to 7 days to reduce swelling. -Sending hydrocortisone  cream to apply 2-3x/day to help with pain and rectal itching. -Warm sitz bath at home 2-3x/day for symptom relief prn. -Advised to increase water and fiber intake to prevent constipation.  Constipation - Patient reports hard, small stools. No recent use of prescribed Linzess  due to insurance coverage issues. -Advise increased water and fiber intake. -Recommend over-the-counter stool softeners, 1-2/day, avoiding those with added laxatives. -Reminded pt on taking OTC Magnesium oxide daily to assist daily BM. -Must drink at least 2 liters water daily for stool softeners to work.  Vitamin D  Deficiency - Previous history of vitamin D  deficiency. Currently not taking vitamin D  supplements. -Advise resumption of over-the-counter vitamin D  supplements, up to 5000 units daily.      Subjective:    Outpatient Medications Prior to Visit  Medication Sig Dispense Refill   albuterol  (VENTOLIN  HFA) 108 (90 Base) MCG/ACT inhaler Inhale 1-2 puffs into the lungs every 6 (six) hours as needed for wheezing or shortness of breath. 8.5 each 5   aspirin-acetaminophen -caffeine (EXCEDRIN MIGRAINE) 250-250-65 MG tablet Take by mouth every 6 (six) hours as needed for headache. (Patient not taking: Reported on 06/18/2023)     Cholecalciferol (VITAMIN D3) 1.25 MG (50000 UT) CAPS Take 1  capsule by mouth once a week. Then start a daily OTC Vitamin D3 up to 2,000units. (Patient not taking: Reported on 06/18/2023) 12 capsule 2   cyclobenzaprine  (FLEXERIL ) 5 MG tablet Take 1-2 tablets (5-10 mg total) by mouth 3 (three) times daily as needed for muscle spasms (Take with Naproxen ; Migraine or other pain.). (Patient not taking:  Reported on 06/18/2023) 60 tablet 1   linaclotide  (LINZESS ) 72 MCG capsule Take 1 capsule (72 mcg total) by mouth daily before breakfast. (Patient not taking: Reported on 06/18/2023) 90 capsule 1   naproxen  (NAPROSYN ) 500 MG tablet Take 1 tablet (500 mg total) by mouth 3 (three) times daily as needed for moderate pain or headache (with meals). Take with 1 Flexeril  for migraine or muscle spasm pain. (Patient not taking: Reported on 06/18/2023) 60 tablet 2   pantoprazole  (PROTONIX ) 20 MG tablet Take 1 tablet (20 mg total) by mouth daily. Take 1 pill twice a day for 2 weeks, then 1 pill every morning. (Patient not taking: Reported on 06/18/2023) 45 tablet 3   SUMAtriptan  (IMITREX ) 50 MG tablet Take 1 tablet (50 mg total) by mouth every 2 (two) hours as needed for migraine (Max dose of 4 pills in 24 hours). Take first dose with 1 Naproxen . (Patient not taking: Reported on 06/18/2023) 20 tablet 2   traZODone  (DESYREL ) 50 MG tablet TAKE 0.5-1 TABLET BY MOUTH AT BEDTIME AS NEEDED FOR SLEEP. (Patient not taking: Reported on 06/18/2023) 90 tablet 2   predniSONE  (DELTASONE ) 10 MG tablet Take in the morning with breakfast. 6 tab day 1, 5 tab day 2-3, 4 tab day 4, 3 tab day 5, 2 tab day 6 25 tablet 0   No facility-administered medications prior to visit.   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   Paresthesia 12/20/2010   Mild   Phlebitis, complicating pregnancy or puerperium    Presence of IVC filter    Rectal pain, chronic 08/11/2012   4/14    Vertigo     Past Surgical History:  Procedure Laterality Date   CESAREAN SECTION     ENDOMETRIAL ABLATION     HERNIA REPAIR     Allergies  Allergen Reactions   Blueberry [Vaccinium Angustifolium] Anaphylaxis   Grapeseed Extract [Nutritional Supplements] Anaphylaxis    Reaction to muscadines    Lyrica [Pregabalin] Swelling    Body and throat swell   Oxycodone  Itching and Nausea And Vomiting   Topiramate     REACTION: confusion-pt thinks 100mg  made her confused   Hydrocodone -Acetaminophen  Rash and Hives      Objective:    Physical Exam Vitals and nursing note reviewed.  Constitutional:      Appearance: Normal appearance. She is ill-appearing.     Interventions: Face mask in place.  HENT:     Right Ear: Tympanic membrane and ear canal normal.     Left Ear: Tympanic membrane and ear canal normal.     Nose:     Right Sinus: Frontal sinus tenderness present.  Left Sinus: Frontal sinus tenderness present.     Mouth/Throat:     Mouth: Mucous membranes are moist.     Pharynx: Posterior oropharyngeal erythema present. No pharyngeal swelling, oropharyngeal exudate or uvula swelling.     Tonsils: No tonsillar exudate or tonsillar abscesses.  Cardiovascular:     Rate and Rhythm: Normal rate and regular rhythm.  Pulmonary:     Effort: Pulmonary effort is normal.     Breath sounds: Normal breath sounds.  Genitourinary:    Rectum: External hemorrhoid (approx 1.5cm in diameter, external, tender to palpation) present.  Musculoskeletal:        General: Normal range of motion.  Lymphadenopathy:     Head:     Right side of head: No preauricular or posterior auricular adenopathy.     Left side of head: No preauricular or posterior auricular adenopathy.     Cervical: No cervical adenopathy.  Skin:    General: Skin is warm and dry.  Neurological:     Mental Status: She is alert.  Psychiatric:        Mood and Affect: Mood normal.        Behavior: Behavior normal.    BP 124/77 (BP  Location: Left Arm, Patient Position: Sitting, Cuff Size: Small)   Pulse 93   Temp 97.6 F (36.4 C) (Temporal)   Ht 5' 2 (1.575 m)   Wt 134 lb 9.6 oz (61.1 kg)   SpO2 100%   BMI 24.62 kg/m  Wt Readings from Last 3 Encounters:  06/18/23 134 lb 9.6 oz (61.1 kg)  12/10/22 128 lb (58.1 kg)  11/27/22 130 lb 8 oz (59.2 kg)       Lucius Krabbe, NP

## 2023-07-21 ENCOUNTER — Telehealth: Payer: Self-pay

## 2023-07-21 ENCOUNTER — Other Ambulatory Visit: Payer: Self-pay | Admitting: Family

## 2023-07-21 DIAGNOSIS — G43711 Chronic migraine without aura, intractable, with status migrainosus: Secondary | ICD-10-CM

## 2023-07-21 NOTE — Telephone Encounter (Signed)
 Copied from CRM [id number]. Topic: Clinical - Medication Question >> Jul 20, 2023  2:11 PM Kathryne Eriksson wrote: Reason for CRM: SUMAtriptan (IMITREX) 50 MG tablet >> Jul 20, 2023  2:11 PM Kathryne Eriksson wrote: Patient is wanting to know if provider could renew her prescription order for SUMAtriptan (IMITREX) 50 MG tablet.     I returned pt's call in regards to refill. Pt would like to be scheduled with PCP to discuss migraines and fatigue. Pt scheduled for 07/24/2023.

## 2023-07-24 ENCOUNTER — Encounter: Payer: Self-pay | Admitting: Family

## 2023-07-24 ENCOUNTER — Ambulatory Visit (INDEPENDENT_AMBULATORY_CARE_PROVIDER_SITE_OTHER): Admitting: Family

## 2023-07-24 VITALS — BP 118/63 | HR 86 | Temp 97.8°F | Ht 62.0 in | Wt 135.2 lb

## 2023-07-24 DIAGNOSIS — G479 Sleep disorder, unspecified: Secondary | ICD-10-CM

## 2023-07-24 DIAGNOSIS — K219 Gastro-esophageal reflux disease without esophagitis: Secondary | ICD-10-CM

## 2023-07-24 DIAGNOSIS — M79672 Pain in left foot: Secondary | ICD-10-CM

## 2023-07-24 DIAGNOSIS — G43711 Chronic migraine without aura, intractable, with status migrainosus: Secondary | ICD-10-CM | POA: Diagnosis not present

## 2023-07-24 DIAGNOSIS — M79671 Pain in right foot: Secondary | ICD-10-CM | POA: Diagnosis not present

## 2023-07-24 DIAGNOSIS — M7918 Myalgia, other site: Secondary | ICD-10-CM

## 2023-07-24 DIAGNOSIS — M797 Fibromyalgia: Secondary | ICD-10-CM

## 2023-07-24 DIAGNOSIS — R5383 Other fatigue: Secondary | ICD-10-CM

## 2023-07-24 MED ORDER — NAPROXEN 500 MG PO TABS: 500.0000 mg | ORAL_TABLET | Freq: Two times a day (BID) | ORAL | 2 refills | Status: AC | PRN

## 2023-07-24 MED ORDER — SUMATRIPTAN SUCCINATE 50 MG PO TABS: 50.0000 mg | ORAL_TABLET | ORAL | 2 refills | Status: AC | PRN

## 2023-07-24 MED ORDER — OMEPRAZOLE 20 MG PO CPDR: 20.0000 mg | DELAYED_RELEASE_CAPSULE | Freq: Every day | ORAL | 3 refills | Status: DC

## 2023-07-24 NOTE — Progress Notes (Signed)
 Patient ID: Bailey Patterson, female    DOB: [birth year], 63 y.o.   MRN: [id number]  Chief Complaint  Patient presents with   Fatigue   Migraine    Pt would like to discuss sumatriptan. Has seen the neurologist but insurance changed.        Discussed the use of AI scribe software for clinical note transcription with the patient, who gave verbal consent to proceed.  History of Present Illness   The patient, with a past medical history of COVID-19, presents with multiple complaints. The chief complaint is fatigue, and the patient states she works multiple jobs and is sleeping only about four hours a night. The patient also reports chronic headaches, for which sumatriptan was previously prescribed but not picked up due to the patient's fear of becoming dependent on medication. The patient also mentions a persistent cough, which started after recovering from COVID-19. The patient has tried over-the-counter vitamin D supplements, but stopped due to stomach discomfort. The patient also reports musculoskeletal issues, including a persistent ache and spasms in the right hand and both feet, which the patient attributes to overuse and previous foot surgery. The patient has tried various remedies, including heat, ice, and lidocaine patches, with limited relief.     Assessment & Plan:     Chronic Headaches - Chronic headaches managed with OTC medications. Reassured sumatriptan is non-addictive and effective. Muscle tension noted, previously relieved with Flexeril. - Send in sumatriptan (Imitrex) again for headache relief, advised on use & SE again with pt. - Referring to neurology for further evaluation. - Advised on discontinuation of excessive Excedrin and caffeine use. -F/U 6mos or prn.  Carpal Tunnel Syndrome - Symptoms include tingling and burning in right wrist, exacerbated by heavy gripping d/t work activities and nocturnal work. Conservative management recommended. - Recommend wrist brace  overnight and during day as able at least 2 weeks.  - Advised icing affected areas for 20 minutes several times per day. -Avoid heavy lifting or gripping for next 2 weeks. -F/U prn  Foot Pain and Neuropathy - Foot pain and neuropathy likely due to previous toe surgery and inappropriate footwear. Symptoms include aching and numbness, worsened by high heels. - Refer to a podiatrist for evaluation and management. - Advise wearing comfortable shoes with proper arch support.  Myalgia/muscle spasm - Chronic, left trapezius. Palpable inflammation, firm. No erythema noted.  -Advised on applying heat up to several times per day. -Try OTC Lidocaine or other analgesia patches/cream tid prn. - Referring to physical therapy. -F/U prn  Insomnia - Chronic insomnia with limited sleep duration. Discussed potential benefits of magnesium supplementation. - Recommend magnesium glycinate, taurate, or L-threonate per bottle directions. - Advised on reducing caffeine intake, especially in the evening.  Gastroesophageal Reflux Disease (GERD) - Chronic cough possibly related to GERD. Occasional heartburn and scratchy throat noted. Trial of acid suppression therapy recommended. - Prescribing generic Prilosec for acid suppression, advised on use & SE. -Take for 2 weeks then can reduce to qod or stop if no sx. -If sx return while not taking, resume for 1-2 weeks, pt advised med does not work 1 time only. -F/U prn  General Health Maintenance - Significant stress and fatigue due to multiple jobs. Discussed lifestyle modifications for health improvement. - Encourage regular physical activity, such as Pilates or yoga. - Discuss importance of work-life balance and stress management.      Subjective:    Outpatient Medications Prior to Visit  Medication Sig Dispense Refill  albuterol (VENTOLIN HFA) 108 (90 Base) MCG/ACT inhaler Inhale 1-2 puffs into the lungs every 6 (six) hours as needed for wheezing  or shortness of breath. 8.5 each 5   hydrocortisone (ANUSOL-HC) 25 MG suppository Place 1 suppository (25 mg total) rectally 2 (two) times daily. 12 suppository 0   Hydrocortisone, Perianal, (PREPARATION H) 1 % CREA Apply 1 Application topically 2 (two) times daily. 30 g 1   SUMAtriptan (IMITREX) 50 MG tablet Take 1 tablet (50 mg total) by mouth every 2 (two) hours as needed for migraine (Max dose of 4 pills in 24 hours). Take first dose with 1 Naproxen. 20 tablet 2   promethazine-dextromethorphan (PROMETHAZINE-DM) 6.25-15 MG/5ML syrup Take 5 mLs by mouth at bedtime as needed for cough (May cause drowsiness.). (Patient not taking: Reported on 07/24/2023) 118 mL 0   No facility-administered medications prior to visit.   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   Paresthesia 12/20/2010   Mild   Phlebitis, complicating pregnancy or puerperium    Presence of IVC filter    Rectal pain, chronic 08/11/2012   4/14    Vertigo    Past Surgical History:  Procedure Laterality Date   CESAREAN SECTION     ENDOMETRIAL ABLATION     HERNIA REPAIR     Allergies  Allergen Reactions   Blueberry [Vaccinium Angustifolium] Anaphylaxis   Grapeseed Extract [Nutritional Supplements] Anaphylaxis    Reaction to muscadines    Lyrica [Pregabalin] Swelling    Body and throat swell   Oxycodone Itching and Nausea And Vomiting   Topiramate     REACTION: confusion-pt thinks 100mg  made her confused   Hydrocodone-Acetaminophen Rash and Hives       Objective:    Physical Exam Vitals and nursing note reviewed.  Constitutional:      Appearance: Normal appearance.  Cardiovascular:     Rate and Rhythm: Normal rate and regular rhythm.  Pulmonary:     Effort: Pulmonary effort is normal.     Breath sounds: Normal breath sounds.  Musculoskeletal:        General: Normal range of motion.     Left shoulder: Swelling (trapezius, firm) and tenderness present.  Skin:    General: Skin is warm and dry.  Neurological:     Mental Status: She is alert.  Psychiatric:        Mood and Affect: Mood normal.        Behavior: Behavior normal.    BP 118/63 (BP Location: Left Arm, Patient Position: Sitting, Cuff Size: Normal)   Pulse 86   Temp 97.8 F (36.6 C) (Temporal)   Ht 5\' 2"  (1.575 m)   Wt 135 lb 3.2 oz (61.3 kg)   SpO2 98%   BMI 24.73 kg/m  Wt Readings from Last 3 Encounters:  07/24/23 135 lb 3.2 oz (61.3 kg)  06/18/23 134 lb 9.6 oz (61.1 kg)  12/10/22 128 lb (58.1 kg)      Dulce Sellar, NP

## 2023-07-25 NOTE — Assessment & Plan Note (Signed)
 Chronic headaches managed with OTC medications. Reassured sumatriptan is non-addictive and effective. Muscle tension noted, previously relieved with Flexeril. - Send in sumatriptan (Imitrex) again for headache relief, advised on use & SE again with pt. - Referring to neurology for further evaluation. - Advised on discontinuation of excessive Excedrin and caffeine use. -F/U 6mos or prn.

## 2023-07-25 NOTE — Assessment & Plan Note (Addendum)
 Chronic insomnia with limited sleep duration, causing continued daytime fatigue. Pt also working 2-3 jobs daily. Discussed potential benefits of magnesium supplementation. -Advised on reducing work schedule as can lead to ongoing stress and physical distress. - Recommend magnesium glycinate, taurate, or L-threonate per bottle directions. - Advised on reducing caffeine intake, especially in the evening.

## 2023-07-25 NOTE — Assessment & Plan Note (Deleted)
 Chronic insomnia with limited sleep duration. Discussed potential benefits of magnesium supplementation. - Recommend magnesium glycinate, taurate, or L-threonate per bottle directions. - Advised on reducing caffeine intake, especially in the evening.

## 2023-07-25 NOTE — Assessment & Plan Note (Signed)
 Chronic, left trapezius. Palpable inflammation, firm. No erythema noted.  -Advised on applying heat up to several times per day. -Try OTC Lidocaine or other analgesia patches/cream tid prn. - Referring to physical therapy. -F/U prn

## 2023-08-05 ENCOUNTER — Ambulatory Visit: Attending: Family

## 2023-08-06 ENCOUNTER — Ambulatory Visit (INDEPENDENT_AMBULATORY_CARE_PROVIDER_SITE_OTHER)

## 2023-08-06 ENCOUNTER — Other Ambulatory Visit: Payer: Self-pay

## 2023-08-06 ENCOUNTER — Encounter: Payer: Self-pay | Admitting: Podiatry

## 2023-08-06 ENCOUNTER — Ambulatory Visit (INDEPENDENT_AMBULATORY_CARE_PROVIDER_SITE_OTHER): Admitting: Podiatry

## 2023-08-06 DIAGNOSIS — M2042 Other hammer toe(s) (acquired), left foot: Secondary | ICD-10-CM

## 2023-08-06 DIAGNOSIS — M79671 Pain in right foot: Secondary | ICD-10-CM

## 2023-08-06 DIAGNOSIS — M79672 Pain in left foot: Secondary | ICD-10-CM | POA: Diagnosis not present

## 2023-08-06 DIAGNOSIS — M2041 Other hammer toe(s) (acquired), right foot: Secondary | ICD-10-CM

## 2023-08-06 DIAGNOSIS — M19071 Primary osteoarthritis, right ankle and foot: Secondary | ICD-10-CM | POA: Diagnosis not present

## 2023-08-06 NOTE — Progress Notes (Signed)
 Subjective:   Patient ID: Bailey Patterson, female   DOB: 63 y.o.   MRN: [id number]   HPI Patient presents stating that she has had a history of surgery on the right foot around 10 years ago and not happy with result with shortening of the toes and has significant corn callus formation left over right that gets very tender.  Patient states she does have family history of this with her mother having very thick type skin with lesions.  Patient does not smoke likes to be active   Review of Systems  All other systems reviewed and are negative.       Objective:  Physical Exam Vitals and nursing note reviewed.  Constitutional:      Appearance: She is well-developed.  Pulmonary:     Effort: Pulmonary effort is normal.  Musculoskeletal:        General: Normal range of motion.  Skin:    General: Skin is warm.  Neurological:     Mental Status: She is alert.     Neurovascular status intact muscle strength was found to be adequate range of motion within normal limits with patient found to have significant deformity of digits but more dry skin and thick calluses secondary to pressure points.  Patient has good digital perfusion well-oriented x 3     Assessment:  Most of this is due to skin structure versus bone structure and did not get a good result right but I do not see how we can do any better on the left     Plan:  H&P discussed condition great length courtesy debridement thick tissue and discussed treatment options and at this point I do not see a good surgical option for her that would be something she would be happy with.  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

## 2023-08-12 ENCOUNTER — Telehealth: Payer: Self-pay | Admitting: Family

## 2023-08-12 DIAGNOSIS — M25561 Pain in right knee: Secondary | ICD-10-CM

## 2023-08-12 NOTE — Telephone Encounter (Signed)
 Referral placed.

## 2023-08-12 NOTE — Telephone Encounter (Signed)
 ok to send to [facility] - DX - knee pain - but need to know which knee? any swelling?

## 2023-08-12 NOTE — Telephone Encounter (Signed)
 Copied from CRM [id number]. Topic: Referral - Request for Referral >> Aug 12, 2023 10:03 AM Sim Boast F wrote: Did the patient discuss referral with their provider in the last year? Yes (If No - schedule appointment) (If Yes - send message)  Appointment offered? No  Type of order/referral and detailed reason for visit: Patient says her knee went out twice,  on Sunday and yesterday - wants referral to rheumatology says it may be arthritis   Preference of office, provider, location: No preference  If referral order, have you been seen by this specialty before? No (If Yes, this issue or another issue? When? Where?  Can we respond through MyChart? No

## 2023-08-13 ENCOUNTER — Ambulatory Visit: Admitting: Family Medicine

## 2023-08-13 ENCOUNTER — Other Ambulatory Visit: Payer: Self-pay

## 2023-08-13 ENCOUNTER — Ambulatory Visit (INDEPENDENT_AMBULATORY_CARE_PROVIDER_SITE_OTHER)

## 2023-08-13 VITALS — BP 118/76 | HR 93 | Ht 62.0 in | Wt 132.0 lb

## 2023-08-13 DIAGNOSIS — M25561 Pain in right knee: Secondary | ICD-10-CM | POA: Diagnosis not present

## 2023-08-13 DIAGNOSIS — G8929 Other chronic pain: Secondary | ICD-10-CM

## 2023-08-13 DIAGNOSIS — M1711 Unilateral primary osteoarthritis, right knee: Secondary | ICD-10-CM | POA: Diagnosis not present

## 2023-08-13 NOTE — Patient Instructions (Addendum)
 Thank you for coming in today.   Please get an Xray today before you leave   Please use Voltaren gel (Generic Diclofenac Gel) up to 4x daily for pain as needed.  This is available over-the-counter as both the name brand Voltaren gel and the generic diclofenac gel.   You received an injection today. Seek immediate medical attention if the joint becomes red, extremely painful, or is oozing fluid.   I recommend you obtained a compression sleeve to help with your joint problems. There are many options on the market however I recommend obtaining a Full Knee Body Helix compression sleeve.  You can find information (including how to appropriate measure yourself for sizing) can be found at [email or web address].  Many of these products are health savings account (HSA) eligible.   You can use the compression sleeve at any time throughout the day but is most important to use while being active as well as for 2 hours post-activity.   It is appropriate to ice following activity with the compression sleeve in place.

## 2023-08-13 NOTE — Progress Notes (Unsigned)
 Bailey Payor, PhD, LAT, ATC acting as a scribe for Bailey Graham, MD.  Bailey Patterson is a 63 y.o. female who presents to [facility] at [facility] today for R knee pain that's been bothering her for a few months and worsened this last weekend. She enjoys dancing and has been having some pain w/ very deep squats. She notes her knee "giving out" on her at times. Pt locates pain to anterior aspect of her R knee  R Knee swelling: yes Mechanical symptoms: yes Aggravates: dancing, deep squat Treatments tried: ice, heat, stretch  Pertinent review of systems: No fevers or chills  Relevant historical information: Migraine headaches.   Exam:  BP 118/76   Pulse 93   Ht 5\' 2"  (1.575 m)   Wt 132 lb (59.9 kg)   SpO2 98%   BMI 24.14 kg/m  General: Well Developed, well nourished, and in no acute distress.   MSK: Right knee mild effusion normal-appearing otherwise. Normal motion. Tender palpation medial joint line. Stable ligamentous exam. Intact strength.    Lab and Radiology Results  Procedure: Real-time Ultrasound Guided Injection of right knee joint superior lateral patella space Device: Philips Affiniti 50G/GE Logiq Images permanently stored and available for review in PACS Ultrasound evaluation prior to injection reveals mild effusion.  Degeneration present at lateral joint line. Verbal informed consent obtained.  Discussed risks and benefits of procedure. Warned about infection, bleeding, hyperglycemia damage to structures among others. Patient expresses understanding and agreement Time-out conducted.   Noted no overlying erythema, induration, or other signs of local infection.   Skin prepped in a sterile fashion.   Local anesthesia: Topical Ethyl chloride.   With sterile technique and under real time ultrasound guidance: 40 mg of Kenalog and 2 mL of Marcaine injected into knee joint. Fluid seen entering the joint capsule.   Completed without difficulty    Pain immediately resolved suggesting accurate placement of the medication.   Advised to call if fevers/chills, erythema, induration, drainage, or persistent bleeding.   Images permanently stored and available for review in the ultrasound unit.  Impression: Technically successful ultrasound guided injection.   X-ray images right knee obtained today personally and independently interpreted. Mild medial and patellofemoral DJD.  No acute fractures. Await formal radiology review     Assessment and Plan: 63 y.o. female with right knee pain due to exacerbation of DJD.  Additionally patient very likely does have a degenerative meniscus tear at the medial and lateral joint line.  Plan for steroid injection today.  Also recommend Voltaren gel and compression sleeve.  Recheck back as needed. PDMP not reviewed this encounter. Orders Placed This Encounter  Procedures   [place] LIMITED JOINT SPACE STRUCTURES LOW RIGHT(NO LINKED CHARGES)    Reason for Exam (SYMPTOM  OR DIAGNOSIS REQUIRED):   right knee pain    Preferred imaging location?:   [facility] Knee AP/LAT W/[place] Right    Standing Status:   Future    Number of Occurrences:   1    Expiration Date:   09/12/2023    Reason for Exam (SYMPTOM  OR DIAGNOSIS REQUIRED):   right knee pain    Preferred imaging location?:   [facility]   No orders of the defined types were placed in this encounter.    Discussed warning signs or symptoms. Please see discharge instructions. Patient expresses understanding.   The above documentation has been reviewed and is accurate and complete Bailey Patterson, M.D.

## 2023-08-17 ENCOUNTER — Encounter: Payer: Self-pay | Admitting: Family Medicine

## 2023-08-17 DIAGNOSIS — G8929 Other chronic pain: Secondary | ICD-10-CM

## 2023-08-17 NOTE — Progress Notes (Signed)
 Right knee x-ray shows mild arthritis.

## 2023-08-17 NOTE — Telephone Encounter (Signed)
 Called and spoke with patient. I recommended giving the injection a full 2 weeks to work in her system to help with sx as well as using Voltaren Gel and compression as recommended at her visit. I advised pt to call back is sx have not improved by the 2 week mark. Pt verbalized understanding and notes that there has already been some improvement of swelling / puffiness.

## 2023-08-17 NOTE — Telephone Encounter (Signed)
 Patient called stating that her knee has been hurting a lot worse. Aware of xray results but would like to know what next steps are.

## 2023-08-20 NOTE — Addendum Note (Signed)
 Addended by: Evon Slack on: 08/20/2023 03:52 PM   Modules accepted: Orders

## 2023-08-23 ENCOUNTER — Ambulatory Visit

## 2023-08-23 DIAGNOSIS — M25561 Pain in right knee: Secondary | ICD-10-CM

## 2023-08-23 DIAGNOSIS — G8929 Other chronic pain: Secondary | ICD-10-CM

## 2023-09-14 ENCOUNTER — Telehealth: Payer: Self-pay | Admitting: Family Medicine

## 2023-09-14 DIAGNOSIS — S83211A Bucket-handle tear of medial meniscus, current injury, right knee, initial encounter: Secondary | ICD-10-CM

## 2023-09-14 DIAGNOSIS — G8929 Other chronic pain: Secondary | ICD-10-CM

## 2023-09-14 DIAGNOSIS — M1711 Unilateral primary osteoarthritis, right knee: Secondary | ICD-10-CM

## 2023-09-14 NOTE — Progress Notes (Signed)
 Right knee MRI shows a big meniscus tear that is making it hard for you to move your knee.  Additionally have a fair amount of arthritis.  Unfortunate I think this meniscus tear will require surgery.  I am going to refer you to orthopedic surgery to talk about treatment options.

## 2023-09-22 ENCOUNTER — Ambulatory Visit: Payer: Self-pay

## 2023-09-24 ENCOUNTER — Ambulatory Visit: Admitting: Family Medicine

## 2023-09-24 VITALS — BP 120/86 | HR 96 | Ht 62.0 in | Wt 129.0 lb

## 2023-09-24 DIAGNOSIS — S83211D Bucket-handle tear of medial meniscus, current injury, right knee, subsequent encounter: Secondary | ICD-10-CM | POA: Diagnosis not present

## 2023-09-24 DIAGNOSIS — M25561 Pain in right knee: Secondary | ICD-10-CM | POA: Diagnosis not present

## 2023-09-24 DIAGNOSIS — G8929 Other chronic pain: Secondary | ICD-10-CM | POA: Diagnosis not present

## 2023-09-24 NOTE — Patient Instructions (Addendum)
 Thank you for coming in today.   Please contact [facility] (Orthopedics) one of our offices have received a referral from your healthcare provider. And we are trying to reach you to schedule an evaluation appointment. Please contact me at [phone]

## 2023-09-24 NOTE — Progress Notes (Signed)
 Joanna Muck, PhD, LAT, ATC acting as a scribe for Bailey Juniper, MD.  Bailey Patterson is a 63 y.o. female who presents to [facility] at [facility] today for f/u R knee pain w/ MRI review. Pt was last seen by Dr. Alease Hunter on 08/13/23 and was given a R knee steroid injection and advised to use Voltaren gel and compression sleeve. Pt later called the office report not relief from CSI and a MRI was ordered. Based on MRI results, she was referred to orthopedic surgery.  Today, pt reports never got scheduled w/ surgery. R knee is feeing about the same. She notes "muscle spasms" in the R-side of her upper chest and into there R arm w/ a contracture in her R hand.   She notes some locking and catching in the right knee.  Dx imaging: 08/23/23 R knee MRI  08/13/23 R knee XR  Pertinent review of systems: No fevers or chills  Relevant historical information: Asthma   Exam:  BP 120/86   Pulse 96   Ht 5\' 2"  (1.575 m)   Wt 129 lb (58.5 kg)   SpO2 98%   BMI 23.59 kg/m  General: Well Developed, well nourished, and in no acute distress.   MSK: Right knee normal-appearing decreased range of motion.  Palpable click with motion.    Lab and Radiology Results  EXAM: MRI OF THE RIGHT KNEE WITHOUT CONTRAST   TECHNIQUE: Multiplanar, multisequence MR imaging of the knee was performed. No intravenous contrast was administered.   COMPARISON:  None Available.   FINDINGS: MENISCI   Medial: Intact.   Lateral: Bucket-handle tear of the lateral meniscus flipped towards the intercondylar notch.   LIGAMENTS   Cruciates: ACL and PCL are intact.   Collaterals: Medial collateral ligament is intact. Lateral collateral ligament complex is intact.   CARTILAGE   Patellofemoral: High-grade partial-thickness cartilage loss of the medial patellar facet and periphery of the medial trochlea.   Medial: Moderate partial-thickness cartilage loss of the medial femorotibial compartment.    Lateral: Moderate partial-thickness cartilage loss of the lateral tibial plateau. Focal cartilage fissuring of the weight-bearing lateral femoral condyle.   JOINT: Moderate joint effusion. Normal Hoffa's fat-pad. No plical thickening.   POPLITEAL FOSSA: Popliteus tendon is intact. No Baker's cyst.   EXTENSOR MECHANISM: Intact quadriceps tendon. Intact patellar tendon. Intact lateral patellar retinaculum. Intact medial patellar retinaculum. Intact MPFL.   BONES: No aggressive osseous lesion. No fracture or dislocation.   Other: No fluid collection or hematoma. Muscles are normal.   IMPRESSION: 1. Bucket-handle tear of the lateral meniscus flipped towards the intercondylar notch. 2. Tricompartmental cartilage abnormalities as described above.     Electronically Signed   By: Onnie Bilis M.D.   On: 09/12/2023 07:20 I, Bailey Patterson, personally (independently) visualized and performed the interpretation of the images attached in this note.     Assessment and Plan: 63 y.o. female with right knee pain and mechanical symptoms.  Patient has a bucket-handle tear on her MRI with associated chronic degenerative osteoarthritis related changes.  She has had a mechanical symptoms and I do not see this bucket-handle tear being something that she can live with for a long time.  I did refer to orthopedic surgery but she was somehow lost in the mix.  I think she missed a phone call.  I provided the phone number to orthopedic surgery so she can call and schedule a surgical consultation for possible removal of the bucket-handle tear.  She  will still have arthritis which still should cause some pain.  Ultimately she would likely require total knee replacement. Reviewed MRI findings and discussed treatment plans and options.  PDMP not reviewed this encounter. No orders of the defined types were placed in this encounter.  No orders of the defined types were placed in this encounter.    Discussed  warning signs or symptoms. Please see discharge instructions. Patient expresses understanding.   The above documentation has been reviewed and is accurate and complete Bailey Patterson, M.D.

## 2023-10-19 ENCOUNTER — Other Ambulatory Visit: Payer: Self-pay | Admitting: Family

## 2023-10-19 DIAGNOSIS — K219 Gastro-esophageal reflux disease without esophagitis: Secondary | ICD-10-CM

## 2023-10-23 ENCOUNTER — Ambulatory Visit (INDEPENDENT_AMBULATORY_CARE_PROVIDER_SITE_OTHER): Admitting: Family Medicine

## 2023-10-23 VITALS — BP 120/86 | HR 96 | Ht 62.0 in | Wt 129.0 lb

## 2023-10-23 DIAGNOSIS — S83211D Bucket-handle tear of medial meniscus, current injury, right knee, subsequent encounter: Secondary | ICD-10-CM | POA: Diagnosis not present

## 2023-10-23 MED ORDER — TRAMADOL HCL 50 MG PO TABS: 50.0000 mg | ORAL_TABLET | Freq: Three times a day (TID) | ORAL | 0 refills | Status: DC | PRN

## 2023-10-23 NOTE — Patient Instructions (Addendum)
 Thank you for coming in today.   You have naproxen . You can one every 12 hours for knee pain without the sumitryptan if needed   You can use that medicine with the lidocaine patches and voltaren gel and tylenol  all together.   Use tramadol sparingly if needed

## 2023-10-23 NOTE — Progress Notes (Signed)
   Joanna Muck, PhD, LAT, ATC acting as a scribe for [name], MD.  Bailey Patterson is a 63 y.o. female who presents to [facility] at [facility] today for worsening R knee pain. Pt was last seen by Dr. Alease Hunter on 09/24/23 and she was provided the phone number to schedule w/ orthopedic surgery. Visit scheduled w/ Dr. Lucienne Ryder 6/16. Last R knee steroid injection, 08/13/23.  Today, pt reports yesterday her R knee popped when she was just her regular activity. 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 She continues to try to dance.  Dx imaging: 08/23/23 R knee MRI             08/13/23 R knee XR  Pertinent review of systems: no fever or chills  Relevant historical information: Chronic migraine headache.   Exam:  BP 120/86   Pulse 96   Ht 5' 2 (1.575 m)   Wt 129 lb (58.5 kg)   SpO2 98%   BMI 23.59 kg/m  General: Well Developed, well nourished, and in no acute distress.   MSK: Right knee palpable click with range of motion.      Assessment and Plan: 63 y.o. female with chronic right knee pain with mechanical symptoms due to the bucket-handle meniscus tear.  She had a steroid injection about 2 months ago which was not effective.  This was done prior to the MRI.  She has an appointment on Monday the 16th with Dr. Lucienne Ryder orthopedic surgery to discuss surgical options.  Expect should be having surgery soon.  Like to avoid steroids if possible.  Discussed the naproxen  dosing.  She can take it with Tylenol  if needed.  Additionally I prescribed tramadol in case she needs it over the weekend.  She generally does not want to use this medication prior to surgery which I think is a good idea.   PDMP reviewed during this encounter. No orders of the defined types were placed in this encounter.  Meds ordered this encounter  Medications   traMADol (ULTRAM) 50 MG tablet    Sig: Take 1 tablet (50 mg total) by mouth every 8 (eight) hours  as needed for severe pain (pain score 7-10).    Dispense:  5 tablet    Refill:  0     Discussed warning signs or symptoms. Please see discharge instructions. Patient expresses understanding.   The above documentation has been reviewed and is accurate and complete [name], M.D.

## 2023-10-26 ENCOUNTER — Ambulatory Visit (INDEPENDENT_AMBULATORY_CARE_PROVIDER_SITE_OTHER): Admitting: Orthopaedic Surgery

## 2023-10-26 ENCOUNTER — Encounter: Payer: Self-pay | Admitting: Orthopaedic Surgery

## 2023-10-26 DIAGNOSIS — G8929 Other chronic pain: Secondary | ICD-10-CM | POA: Diagnosis not present

## 2023-10-26 DIAGNOSIS — M25561 Pain in right knee: Secondary | ICD-10-CM | POA: Diagnosis not present

## 2023-10-26 DIAGNOSIS — S83251D Bucket-handle tear of lateral meniscus, current injury, right knee, subsequent encounter: Secondary | ICD-10-CM

## 2023-10-26 NOTE — Progress Notes (Signed)
 The patient is a 63 year old female sent to me from Dr. [name] to evaluate and treat a symptomatic bucket-handle tear of her right knee lateral meniscus.  She does have some tricompartment arthritic changes but no full-thickness cartilage defect.  She has tried and failed conservative treatment.  She does dance and says that it does make it worse when she dances and she recently has had the knee started locking catching on her.  She has had a long history of patellofemoral popping and and making noises from what she describes.  However the mechanical symptoms have been more recent with her right knee with locking and catching.  Examination of her right knee shows no effusion today.  There is lateral joint line tenderness and a significantly positive McMurray's exam to the lateral compartment of her knee.  There is patellofemoral crepitation as well.  The MRI is reviewed as well as plain films of her right knee.  There is a large bucket-handle tear of the lateral meniscus with a flap component.  The articular cartilage appears to be thin and that there is no effusion and there is no significant subchondral edema.  I went over a knee model with her and we have recommended arthroscopic surgery with the right knee arthroscopy and partial lateral meniscectomy.  We will be able to assess the cartilage of her knee intraoperatively as well.  I do not feel that a knee replacement is needed right now based on her clinical exam and MRI findings but an arthroscopic intervention is definitely warranted based on the large tear of the meniscus combined with her clinical exam findings and signs and symptoms.  I did describe what knee arthroscopy involves and discussed the risk and benefits of the surgery and what to expect from an intraoperative and postoperative standpoint.  She does work on her feet all day long and can be at work up to 6 weeks.  We will work on getting her scheduled.  She wants to wait till after June  25 so she knows to be very careful in her knee until then and she knows we will be in touch with scheduling her for a right knee arthroscopy.

## 2023-10-27 NOTE — Telephone Encounter (Signed)
 Opened in errror

## 2023-11-16 ENCOUNTER — Telehealth: Payer: Self-pay | Admitting: Orthopaedic Surgery

## 2023-11-16 NOTE — Telephone Encounter (Signed)
 IC, spoke with patient, advised that what she dropped off is not the form for the provider. She expressed understanding and stated she would try to get the form to the office later today.

## 2023-11-16 NOTE — Telephone Encounter (Signed)
 Pt submitted medical release form, Forms and 20.00 payment

## 2023-11-17 NOTE — Telephone Encounter (Signed)
 FMLA form received, completed. I emailed to employer at [email or web address]

## 2023-11-26 ENCOUNTER — Encounter (HOSPITAL_BASED_OUTPATIENT_CLINIC_OR_DEPARTMENT_OTHER): Payer: Self-pay | Admitting: Orthopaedic Surgery

## 2023-11-26 ENCOUNTER — Other Ambulatory Visit: Payer: Self-pay

## 2023-11-30 NOTE — Progress Notes (Signed)

## 2023-12-02 DIAGNOSIS — S83271A Complex tear of lateral meniscus, current injury, right knee, initial encounter: Secondary | ICD-10-CM | POA: Insufficient documentation

## 2023-12-02 NOTE — H&P (Signed)
 Bailey Patterson is an 63 y.o. female.   Chief Complaint: Right knee pain with locking and catching HPI: The patient is a 62 year old active female with a known right knee complex lateral meniscal tear with a flap component that is causing mechanical symptoms.  This has been confirmed with MRI findings and clinical exam findings.  She has tried and failed all forms of conservative treatment and this point a right knee arthroscopy has been recommended to address the meniscal tear.  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 She has never used smokeless tobacco. She reports that she does not drink alcohol and does not use drugs.  Allergies:  Allergies  Allergen Reactions   Blueberry [Vaccinium Angustifolium] Anaphylaxis   Grapeseed Extract [Nutritional Supplements] Anaphylaxis    Reaction to muscadines    Pineapple Anaphylaxis    Lyrica [Pregabalin] Swelling    Body and throat swell   Oxycodone  Itching and Nausea And Vomiting   Topiramate     REACTION: confusion-pt thinks 100mg  made her confused   Hydrocodone -Acetaminophen  Rash and Hives    No medications prior to admission.    No results found for this or any previous visit (from the past 48 hours). No results found.  Review of Systems  Height 5' 2 (1.575 m), weight 59 kg. Physical Exam Vitals reviewed.  Constitutional:      Appearance: Normal appearance. She is normal weight.  HENT:     Head: Normocephalic and atraumatic.  Eyes:     Extraocular Movements: Extraocular movements intact.     Pupils: Pupils are equal, round, and reactive to light.  Cardiovascular:     Rate and Rhythm: Normal rate.  Pulmonary:     Effort: Pulmonary effort is normal.     Breath sounds: Normal breath sounds.  Abdominal:     Palpations: Abdomen is soft.  Musculoskeletal:     Cervical back: Normal range of motion and neck supple.     Left knee: Effusion and bony tenderness present. Decreased range of motion. Tenderness present over the lateral joint line. Abnormal alignment and abnormal meniscus.  Neurological:     Mental Status: She is alert and oriented to person, place, and time.  Psychiatric:        Behavior: Behavior normal.      Assessment/Plan Right knee complex lateral meniscal tear with flap component  The plan is to proceed to surgery for a right knee arthroscopy with partial lateral meniscectomy.  The rationale behind surgery as well as the risk and benefits of surgery been discussed in detail.  Lonni CINDERELLA Poli, MD 12/02/2023, 5:02 PM

## 2023-12-03 ENCOUNTER — Ambulatory Visit (HOSPITAL_BASED_OUTPATIENT_CLINIC_OR_DEPARTMENT_OTHER)
Admission: RE | Admit: 2023-12-03 | Discharge: 2023-12-03 | Disposition: A | Discharge status: Home / Self Care | Attending: Orthopaedic Surgery | Admitting: Orthopaedic Surgery

## 2023-12-03 ENCOUNTER — Encounter (HOSPITAL_BASED_OUTPATIENT_CLINIC_OR_DEPARTMENT_OTHER)
Admission: RE | Disposition: A | Discharge status: Home / Self Care | Payer: Self-pay | Source: Home / Self Care | Attending: Orthopaedic Surgery

## 2023-12-03 ENCOUNTER — Ambulatory Visit (HOSPITAL_BASED_OUTPATIENT_CLINIC_OR_DEPARTMENT_OTHER): Admitting: Certified Registered"

## 2023-12-03 ENCOUNTER — Encounter: Admitting: Orthopaedic Surgery

## 2023-12-03 ENCOUNTER — Encounter (HOSPITAL_BASED_OUTPATIENT_CLINIC_OR_DEPARTMENT_OTHER): Payer: Self-pay | Admitting: Orthopaedic Surgery

## 2023-12-03 ENCOUNTER — Other Ambulatory Visit: Payer: Self-pay

## 2023-12-03 DIAGNOSIS — S83251D Bucket-handle tear of lateral meniscus, current injury, right knee, subsequent encounter: Secondary | ICD-10-CM | POA: Diagnosis present

## 2023-12-03 DIAGNOSIS — X58XXXA Exposure to other specified factors, initial encounter: Secondary | ICD-10-CM | POA: Insufficient documentation

## 2023-12-03 DIAGNOSIS — S83281D Other tear of lateral meniscus, current injury, right knee, subsequent encounter: Secondary | ICD-10-CM | POA: Diagnosis not present

## 2023-12-03 DIAGNOSIS — K219 Gastro-esophageal reflux disease without esophagitis: Secondary | ICD-10-CM | POA: Insufficient documentation

## 2023-12-03 DIAGNOSIS — M2241 Chondromalacia patellae, right knee: Secondary | ICD-10-CM | POA: Diagnosis not present

## 2023-12-03 DIAGNOSIS — M94261 Chondromalacia, right knee: Secondary | ICD-10-CM | POA: Diagnosis not present

## 2023-12-03 DIAGNOSIS — S83271D Complex tear of lateral meniscus, current injury, right knee, subsequent encounter: Secondary | ICD-10-CM | POA: Diagnosis not present

## 2023-12-03 DIAGNOSIS — Z86718 Personal history of other venous thrombosis and embolism: Secondary | ICD-10-CM | POA: Diagnosis not present

## 2023-12-03 DIAGNOSIS — S83281A Other tear of lateral meniscus, current injury, right knee, initial encounter: Secondary | ICD-10-CM

## 2023-12-03 DIAGNOSIS — S83271A Complex tear of lateral meniscus, current injury, right knee, initial encounter: Secondary | ICD-10-CM | POA: Insufficient documentation

## 2023-12-03 HISTORY — PX: KNEE ARTHROSCOPY WITH LATERAL MENISECTOMY: SHX6193

## 2023-12-03 SURGERY — ARTHROSCOPY, KNEE, WITH LATERAL MENISCECTOMY
Anesthesia: General | Site: Knee | Laterality: Right

## 2023-12-03 MED ORDER — LIDOCAINE 2% (20 MG/ML) 5 ML SYRINGE
INTRAMUSCULAR | Status: AC
  Filled 2023-12-03: qty 5

## 2023-12-03 MED ORDER — FENTANYL CITRATE (PF) 100 MCG/2ML IJ SOLN
INTRAMUSCULAR | Status: AC
Start: 2023-12-03 — End: 2023-12-03
  Filled 2023-12-03: qty 2

## 2023-12-03 MED ORDER — KETOROLAC TROMETHAMINE 30 MG/ML IJ SOLN
INTRAMUSCULAR | Status: AC
  Filled 2023-12-03: qty 1

## 2023-12-03 MED ORDER — HYDROCODONE-ACETAMINOPHEN 5-325 MG PO TABS: 1.0000 | ORAL_TABLET | Freq: Four times a day (QID) | ORAL | 0 refills | Status: AC | PRN

## 2023-12-03 MED ORDER — PHENYLEPHRINE 80 MCG/ML (10ML) SYRINGE FOR IV PUSH (FOR BLOOD PRESSURE SUPPORT)
PREFILLED_SYRINGE | INTRAVENOUS | Status: AC
  Filled 2023-12-03: qty 10

## 2023-12-03 MED ORDER — OXYCODONE HCL 5 MG PO TABS
ORAL_TABLET | ORAL | Status: AC
  Filled 2023-12-03: qty 1

## 2023-12-03 MED ORDER — SODIUM CHLORIDE 0.9 % IR SOLN
Status: DC | PRN
  Administered 2023-12-03: 6000 mL

## 2023-12-03 MED ORDER — DEXAMETHASONE SODIUM PHOSPHATE 10 MG/ML IJ SOLN
INTRAMUSCULAR | Status: AC
  Filled 2023-12-03: qty 1

## 2023-12-03 MED ORDER — FENTANYL CITRATE (PF) 100 MCG/2ML IJ SOLN
INTRAMUSCULAR | Status: DC | PRN
  Administered 2023-12-03: 25 ug via INTRAVENOUS
  Administered 2023-12-03: 50 ug via INTRAVENOUS
  Administered 2023-12-03: 25 ug via INTRAVENOUS

## 2023-12-03 MED ORDER — PHENYLEPHRINE HCL (PRESSORS) 10 MG/ML IV SOLN
INTRAVENOUS | Status: DC | PRN
  Administered 2023-12-03: 80 ug via INTRAVENOUS

## 2023-12-03 MED ORDER — CEFAZOLIN SODIUM-DEXTROSE 2-4 GM/100ML-% IV SOLN
2.0000 g | INTRAVENOUS | Status: AC
  Administered 2023-12-03: 2 g via INTRAVENOUS

## 2023-12-03 MED ORDER — ONDANSETRON 4 MG PO TBDP: 4.0000 mg | ORAL_TABLET | Freq: Three times a day (TID) | ORAL | 0 refills | Status: AC | PRN

## 2023-12-03 MED ORDER — DEXAMETHASONE SODIUM PHOSPHATE 10 MG/ML IJ SOLN
INTRAMUSCULAR | Status: DC | PRN
  Administered 2023-12-03: 5 mg via INTRAVENOUS

## 2023-12-03 MED ORDER — BUPIVACAINE HCL (PF) 0.5 % IJ SOLN
INTRAMUSCULAR | Status: AC
  Filled 2023-12-03: qty 30

## 2023-12-03 MED ORDER — PROPOFOL 10 MG/ML IV BOLUS
INTRAVENOUS | Status: AC
Start: 2023-12-03 — End: 2023-12-03
  Filled 2023-12-03: qty 20

## 2023-12-03 MED ORDER — ACETAMINOPHEN 500 MG PO TABS
1000.0000 mg | ORAL_TABLET | Freq: Once | ORAL | Status: AC
  Administered 2023-12-03: 1000 mg via ORAL

## 2023-12-03 MED ORDER — PROPOFOL 10 MG/ML IV BOLUS
INTRAVENOUS | Status: DC | PRN
  Administered 2023-12-03: 200 mg via INTRAVENOUS

## 2023-12-03 MED ORDER — LACTATED RINGERS IV SOLN: INTRAVENOUS | Status: DC

## 2023-12-03 MED ORDER — MIDAZOLAM HCL 5 MG/5ML IJ SOLN
INTRAMUSCULAR | Status: DC | PRN
  Administered 2023-12-03: 2 mg via INTRAVENOUS

## 2023-12-03 MED ORDER — METHOCARBAMOL 500 MG PO TABS: 500.0000 mg | ORAL_TABLET | Freq: Four times a day (QID) | ORAL | 0 refills | Status: AC

## 2023-12-03 MED ORDER — AMISULPRIDE (ANTIEMETIC) 5 MG/2ML IV SOLN: 10.0000 mg | Freq: Once | INTRAVENOUS | Status: DC | PRN

## 2023-12-03 MED ORDER — MIDAZOLAM HCL 2 MG/2ML IJ SOLN
INTRAMUSCULAR | Status: AC
Start: 2023-12-03 — End: 2023-12-03
  Filled 2023-12-03: qty 2

## 2023-12-03 MED ORDER — KETOROLAC TROMETHAMINE 30 MG/ML IJ SOLN
30.0000 mg | Freq: Once | INTRAMUSCULAR | Status: AC | PRN
  Administered 2023-12-03: 30 mg via INTRAVENOUS

## 2023-12-03 MED ORDER — MORPHINE SULFATE (PF) 4 MG/ML IV SOLN
INTRAVENOUS | Status: AC
  Filled 2023-12-03: qty 1

## 2023-12-03 MED ORDER — FENTANYL CITRATE (PF) 100 MCG/2ML IJ SOLN
INTRAMUSCULAR | Status: AC
  Filled 2023-12-03: qty 2

## 2023-12-03 MED ORDER — ONDANSETRON HCL 4 MG/2ML IJ SOLN
INTRAMUSCULAR | Status: AC
Start: 2023-12-03 — End: 2023-12-03
  Filled 2023-12-03: qty 2

## 2023-12-03 MED ORDER — LIDOCAINE HCL (CARDIAC) PF 100 MG/5ML IV SOSY
PREFILLED_SYRINGE | INTRAVENOUS | Status: DC | PRN
  Administered 2023-12-03: 60 mg via INTRAVENOUS

## 2023-12-03 MED ORDER — FENTANYL CITRATE (PF) 100 MCG/2ML IJ SOLN
25.0000 ug | INTRAMUSCULAR | Status: DC | PRN
  Administered 2023-12-03 (×3): 50 ug via INTRAVENOUS

## 2023-12-03 MED ORDER — ACETAMINOPHEN 500 MG PO TABS
ORAL_TABLET | ORAL | Status: AC
  Filled 2023-12-03: qty 2

## 2023-12-03 MED ORDER — BUPIVACAINE HCL (PF) 0.25 % IJ SOLN
INTRAMUSCULAR | Status: AC
  Filled 2023-12-03: qty 30

## 2023-12-03 MED ORDER — CEFAZOLIN SODIUM-DEXTROSE 2-4 GM/100ML-% IV SOLN
INTRAVENOUS | Status: AC
  Filled 2023-12-03: qty 100

## 2023-12-03 MED ORDER — BUPIVACAINE HCL (PF) 0.25 % IJ SOLN
INTRAMUSCULAR | Status: DC | PRN
  Administered 2023-12-03: 20 mL

## 2023-12-03 SURGICAL SUPPLY — 28 items
BLADE EXCALIBUR 4.0X13 (MISCELLANEOUS) IMPLANT
BNDG ELASTIC 6INX 5YD STR LF (GAUZE/BANDAGES/DRESSINGS) ×2 IMPLANT
DISSECTOR 3.8MM X 13CM (MISCELLANEOUS) IMPLANT
DRAPE ARTHROSCOPY W/POUCH 90 (DRAPES) ×2 IMPLANT
DRAPE IMP U-DRAPE 54X76 (DRAPES) IMPLANT
DRAPE U-SHAPE 47X51 STRL (DRAPES) ×2 IMPLANT
DURAPREP 26ML APPLICATOR (WOUND CARE) ×2 IMPLANT
ELECTRODE REM PT RTRN 9FT ADLT (ELECTROSURGICAL) IMPLANT
EXCALIBUR 3.8MM X 13CM (MISCELLANEOUS) IMPLANT
GAUZE PAD ABD 8X10 STRL (GAUZE/BANDAGES/DRESSINGS) ×2 IMPLANT
GAUZE SPONGE 4X4 12PLY STRL (GAUZE/BANDAGES/DRESSINGS) ×2 IMPLANT
GAUZE XEROFORM 1X8 LF (GAUZE/BANDAGES/DRESSINGS) ×2 IMPLANT
GLOVE BIOGEL PI IND STRL 8 (GLOVE) ×4 IMPLANT
GLOVE ORTHO TXT STRL SZ7.5 (GLOVE) ×2 IMPLANT
GLOVE SURG ORTHO 8.0 STRL STRW (GLOVE) ×2 IMPLANT
GOWN STRL REUS W/ TWL LRG LVL3 (GOWN DISPOSABLE) ×4 IMPLANT
GOWN STRL REUS W/ TWL XL LVL3 (GOWN DISPOSABLE) ×2 IMPLANT
MANIFOLD NEPTUNE II (INSTRUMENTS) IMPLANT
PACK ARTHROSCOPY DSU (CUSTOM PROCEDURE TRAY) ×2 IMPLANT
PACK BASIN DAY SURGERY FS (CUSTOM PROCEDURE TRAY) ×2 IMPLANT
PADDING CAST COTTON 6X4 STRL (CAST SUPPLIES) ×2 IMPLANT
PENCIL SMOKE EVACUATOR (MISCELLANEOUS) IMPLANT
SUT ETHILON 3 0 PS 1 (SUTURE) ×2 IMPLANT
TOWEL GREEN STERILE FF (TOWEL DISPOSABLE) ×2 IMPLANT
TUBING ARTHROSCOPY IRRIG 16FT (MISCELLANEOUS) ×2 IMPLANT
WAND ABLATOR APOLLO I90 (BUR) IMPLANT
WATER STERILE IRR 1000ML POUR (IV SOLUTION) ×2 IMPLANT
WRAP KNEE MAXI GEL POST OP (GAUZE/BANDAGES/DRESSINGS) ×2 IMPLANT

## 2023-12-03 NOTE — Anesthesia Postprocedure Evaluation (Signed)
 Anesthesia Post Note  Patient: Shanena Pellegrino  Procedure(s) Performed: ARTHROSCOPY, KNEE, WITH PARTIAL LATERAL MENISCECTOMY (Right: Knee)     Patient location during evaluation: PACU Anesthesia Type: General Level of consciousness: awake Pain management: pain level controlled Vital Signs Assessment: post-procedure vital signs reviewed and stable Respiratory status: spontaneous breathing, nonlabored ventilation and respiratory function stable Cardiovascular status: blood pressure returned to baseline and stable Postop Assessment: no apparent nausea or vomiting Anesthetic complications: no   No notable events documented.  Last Vitals:  Vitals:   12/03/23 1145 12/03/23 1158  BP: (!) 140/86 (!) 130/95  Pulse: 71 72  Resp: 10 16  Temp:  (!) 36.3 C  SpO2: 98% 100%    Last Pain:  Vitals:   12/03/23 1204  TempSrc:   PainSc: 5                  Laelia Angelo P Annaliah Rivenbark

## 2023-12-03 NOTE — Op Note (Signed)
 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 Vernetta, MD Assistant: Tory Gaskins, PA-C  Anesthesia: #1 General, #2 local EBL: Minimal Antibiotics: IV Ancef  Complications: None  Indications: The patient is a 63 year old female with an acute meniscal tear involving her right knee.  She has a large flap component of the meniscus and is caught up in the notch.  She also has chondromalacia lateral aspect of her knee.  Her knee keeps locking on her quite a bit and even in the office is a locked position due to a large flap tear of the meniscus.  At this point we have recommended an arthroscopic intervention to address the meniscal tear.  The risks and benefits of the surgery have been discussed in detail and informed consent has been obtained.  We did discuss the risks of arthritis becoming progressive especially with having some of the meniscus removed.  She does not perform high impact aerobic activities and is very thin.  Her knee is otherwise in a neutral aligned position with just slight valgus.  Procedure description: After informed consent was obtained and the appropriate right knee was marked the patient was brought to the operating room and placed upon the operating table.  General anesthesia via LMA was obtained.  The right thigh, knee, leg, and ankle were prepped and draped with DuraPrep and sterile drapes.  A sterile stockinette was utilized as well.  With the bed raised in a lateral leg placed utilized the  right knee was flexed off the side of the table.  A timeout was called and she was identified as the correct patient the correct right knee.  An anterior lateral arthroscopy portal was then made and a camera is inserted the knee.  We went to the medial compartment and made an anterior medial incision.  Through the medial arthroscopy portal we placed a probe and probed the medial compartment the knee.  The medial meniscus was intact and there was grade 2 to almost grade III chondromalacia of the medial femoral condyle but no loose fragmentation and more of fissuring.  There was no exposed bone.  We placed an arthroscopic shaver in the medial compartment of the knee and performed just a minimal chondroplasty of the medial femoral condyle.  We did find the ACL to be intact.  With the knee in figure 4 position there was a large and complex lateral meniscal tear with a flap component.  We had to make an accessory portal at the anterior aspect of the knee and we used basket forceps/biters and 2 different arthroscopic shavers to perform a partial lateral meniscectomy to almost complete meniscectomy to remove the large flap component of the meniscus.  There was grade III chondromalacia in the lateral compartment of the knee.  We finally assessed the patellofemoral joint again and again found grade II chondromalacia of the patella.  We did debride some of Hoffa's fat pad that was inflamed.  We then allow fluid lavage to the knee and drained all fluid from the knee.  All 3 portal sites were closed with interrupted nylon suture.  Xeroform well-padded sterile dressing was applied.  The patient was awakened, extubated and taken to the recovery room in stable condition.  Postoperatively we will give her follow-up and wound care instructions with weightbearing as tolerated and will go from there.

## 2023-12-03 NOTE — Interval H&P Note (Signed)
 History and Physical Interval Note: The patient understands that she is here today for a right knee arthroscopy with a partial lateral meniscectomy given the symptomatic lateral meniscal flap tear that she has with her knee.  There has been no acute or interval change in her medical status.  The risks and benefits of surgery have been discussed in detail and informed consent has been obtained.  The right operative knee has been marked.  12/03/2023 9:34 AM  Bailey Patterson  has presented today for surgery, with the diagnosis of bucket handle tear right knee lateral meniscus.  The various methods of treatment have been discussed with the patient and family. After consideration of risks, benefits and other options for treatment, the patient has consented to  Procedure(s): ARTHROSCOPY, KNEE, WITH LATERAL MENISCECTOMY (Right) as a surgical intervention.  The patient's history has been reviewed, patient examined, no change in status, stable for surgery.  I have reviewed the patient's chart and labs.  Questions were answered to the patient's satisfaction.     Bailey Patterson

## 2023-12-03 NOTE — Anesthesia Procedure Notes (Signed)
 Procedure Name: LMA Insertion Date/Time: 12/03/2023 9:56 AM  Performed by: Debarah Chiquita LABOR, CRNAPre-anesthesia Checklist: Patient identified, Emergency Drugs available, Suction available and Patient being monitored Patient Re-evaluated:Patient Re-evaluated prior to induction Oxygen Delivery Method: Circle system utilized Preoxygenation: Pre-oxygenation with 100% oxygen Induction Type: IV induction Ventilation: Mask ventilation without difficulty LMA: LMA inserted LMA Size: 4.0 Number of attempts: 1 Airway Equipment and Method: Bite block Placement Confirmation: positive ETCO2 Tube secured with: Tape Dental Injury: Teeth and Oropharynx as per pre-operative assessment

## 2023-12-03 NOTE — Discharge Instructions (Addendum)
 You may put all of your weight on your right leg as comfort allows. Slowly increase your activities as comfort allows. Expect significant right knee swelling -ice and elevation intermittently throughout the next few days as needed. In 1 to 2 days you may remove all of your dressing and get your 3 incisions wet in the shower daily.  Do expect swelling and some bloody drainage. After each shower, you may place small Band-Aids over the incisions daily. Hydrocodone  and a muscle relaxant have been sent in to your pharmacy. If you do develop a rash, take Benadryl  as needed orally.  No Tylenol  until 3:00pm today, if needed.

## 2023-12-03 NOTE — Anesthesia Preprocedure Evaluation (Addendum)
 Anesthesia Evaluation  Patient identified by MRN, date of birth, ID band Patient awake    Reviewed: Allergy & Precautions, NPO status , Patient's Chart, lab work & pertinent test results  Airway Mallampati: II  TM Distance: >3 FB Neck ROM: Full    Dental  (+) Missing   Pulmonary asthma    Pulmonary exam normal        Cardiovascular + DVT  Normal cardiovascular exam     Neuro/Psych  Headaches PSYCHIATRIC DISORDERS Anxiety        GI/Hepatic Neg liver ROS,GERD  Medicated and Controlled,,  Endo/Other  negative endocrine ROS    Renal/GU negative Renal ROS     Musculoskeletal  (+)  Fibromyalgia -  Abdominal   Peds  Hematology negative hematology ROS (+)   Anesthesia Other Findings bucket handle tear right knee lateral meniscus  Reproductive/Obstetrics                              Anesthesia Physical Anesthesia Plan  ASA: 2  Anesthesia Plan: General   Post-op Pain Management:    Induction: Intravenous  PONV Risk Score and Plan: 3 and Ondansetron , Dexamethasone , Midazolam  and Treatment may vary due to age or medical condition  Airway Management Planned: LMA  Additional Equipment:   Intra-op Plan:   Post-operative Plan: Extubation in OR  Informed Consent: I have reviewed the patients History and Physical, chart, labs and discussed the procedure including the risks, benefits and alternatives for the proposed anesthesia with the patient or authorized representative who has indicated his/her understanding and acceptance.     Dental advisory given  Plan Discussed with: CRNA  Anesthesia Plan Comments:          Anesthesia Quick Evaluation

## 2023-12-03 NOTE — Transfer of Care (Signed)
 Immediate Anesthesia Transfer of Care Note  Patient: Bailey Patterson  Procedure(s) Performed: ARTHROSCOPY, KNEE, WITH PARTIAL LATERAL MENISCECTOMY (Right: Knee)  Patient Location: PACU  Anesthesia Type:General  Level of Consciousness: awake, alert , and oriented  Airway & Oxygen Therapy: Patient Spontanous Breathing and Patient connected to face mask oxygen  Post-op Assessment: Report given to RN and Post -op Vital signs reviewed and stable  Post vital signs: Reviewed and stable  Last Vitals:  Vitals Value Taken Time  BP 147/94 (108)   Temp    Pulse 88   Resp 15 12/03/23 11:00  SpO2 100   Vitals shown include unfiled device data.  Last Pain:  Vitals:   12/03/23 0849  TempSrc: Temporal  PainSc: 7       Patients Stated Pain Goal: 4 (12/03/23 0849)  Complications: No notable events documented.

## 2023-12-04 ENCOUNTER — Encounter (HOSPITAL_BASED_OUTPATIENT_CLINIC_OR_DEPARTMENT_OTHER): Payer: Self-pay | Admitting: Orthopaedic Surgery

## 2023-12-07 ENCOUNTER — Other Ambulatory Visit: Payer: Self-pay | Admitting: Orthopaedic Surgery

## 2023-12-07 ENCOUNTER — Telehealth: Payer: Self-pay | Admitting: Orthopaedic Surgery

## 2023-12-07 MED ORDER — IBUPROFEN 800 MG PO TABS: 800.0000 mg | ORAL_TABLET | Freq: Three times a day (TID) | ORAL | 3 refills | Status: AC | PRN

## 2023-12-07 NOTE — Telephone Encounter (Signed)
 Pt called stating she don't like how the hydrocodone  make her feel and asking to switch to ibuprofen  800mg . He takes the tylenol  does not work for her pain either. Please send ibuprofen  800 mg to [facility]. Please call  pt when sent. Pt number is [phone].

## 2023-12-10 ENCOUNTER — Ambulatory Visit: Admitting: Orthopaedic Surgery

## 2023-12-10 ENCOUNTER — Other Ambulatory Visit: Payer: Self-pay

## 2023-12-10 ENCOUNTER — Encounter: Payer: Self-pay | Admitting: Orthopaedic Surgery

## 2023-12-10 DIAGNOSIS — Z9889 Other specified postprocedural states: Secondary | ICD-10-CM

## 2023-12-10 NOTE — Progress Notes (Signed)
 The patient is here today for first postoperative visit status post a right knee arthroscopy with a partial to near complete lateral meniscectomy for a large lateral meniscal tear of the right knee with a flap component that was stuck in the intercondylar notch area of the knee.  On exam today her right knee has a very large postoperative effusion.  She has pretty good extension but flexion is tight and painful.  Her calf is soft.  The incisions look good and sutures have been removed.  I talked her today about aspirating fluid off her right knee and placing a steroid in the right knee to help with this postoperatively.  I do feel that she is a good candidate for outpatient physical therapy for right knee range of motion and strengthening status post a significant lateral meniscectomy of the right knee.  She agrees with this treatment plan.  We did aspirate about 45 to 50 cc of bloody fluid off of the right knee and then placed a steroid in her right knee which she tolerated well.  Will set her up for outpatient physical therapy and then see her back in 4 weeks to see how she is doing overall.

## 2023-12-22 ENCOUNTER — Other Ambulatory Visit: Payer: Self-pay

## 2023-12-22 ENCOUNTER — Ambulatory Visit: Attending: Orthopaedic Surgery

## 2023-12-22 DIAGNOSIS — Z9889 Other specified postprocedural states: Secondary | ICD-10-CM | POA: Insufficient documentation

## 2023-12-22 DIAGNOSIS — R262 Difficulty in walking, not elsewhere classified: Secondary | ICD-10-CM | POA: Insufficient documentation

## 2023-12-22 DIAGNOSIS — M6281 Muscle weakness (generalized): Secondary | ICD-10-CM | POA: Insufficient documentation

## 2023-12-22 DIAGNOSIS — M25561 Pain in right knee: Secondary | ICD-10-CM | POA: Insufficient documentation

## 2023-12-22 NOTE — Therapy (Signed)
 OUTPATIENT PHYSICAL THERAPY LOWER EXTREMITY EVALUATION   Patient Name: Bailey Patterson MRN: [id number] DOB:August 13, 1960, 63 y.o., female Today's Date: 12/22/2023  END OF SESSION:  PT End of Session - 12/22/23 1522     Visit Number 1    Number of Visits 17    Date for PT Re-Evaluation 02/23/24    Authorization Type [employer]    Authorization Time Period 1    PT Start Time 1415    PT Stop Time 1500    PT Time Calculation (min) 45 min    Activity Tolerance Patient tolerated treatment well    Behavior During Therapy WFL for tasks assessed/performed          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   Paresthesia 12/20/2010   Mild   Phlebitis, complicating pregnancy or puerperium    Presence of IVC filter    Rectal pain, chronic 08/11/2012   4/14    Vertigo    Past Surgical History:  Procedure Laterality Date   CESAREAN SECTION     ENDOMETRIAL ABLATION     HERNIA REPAIR     KNEE ARTHROSCOPY WITH LATERAL MENISECTOMY Right 12/03/2023   Procedure: ARTHROSCOPY, KNEE, WITH PARTIAL LATERAL MENISCECTOMY;  Surgeon: Vernetta Lonni GRADE, MD;  Location: [facility];  Service: Orthopedics;  Laterality: Right;   Patient Active Problem List   Diagnosis Date Noted   Acute lateral meniscus tear of right knee 12/03/2023   Complex tear of lateral meniscus of right knee as current injury 12/02/2023   COVID-19 11/27/2022   Other fatigue 10/13/2022   Fibromyalgia 08/27/2022   Sleep disorder 05/28/2022    Gastroesophageal reflux disease without esophagitis 04/11/2022   Vitamin D  deficiency 12/27/2021   Postmenopausal bleeding 12/27/2021   Mild intermittent asthma without complication 12/26/2021   History of COVID-19 02/02/2020   Chronic migraine without aura, with intractable migraine, so stated, with status migrainosus 07/16/2017   Other constipation 08/11/2012    PCP: Lucius Krabbe, NP   REFERRING PROVIDER: Vernetta Lonni GRADE, MD   REFERRING DIAG: [phone] (ICD-10-CM) - Status post arthroscopy of right knee   THERAPY DIAG:  Acute pain of right knee  Muscle weakness (generalized)  Difficulty in walking, not elsewhere classified  Rationale for Evaluation and Treatment: Rehabilitation  ONSET DATE: 12/03/23- surgery  SUBJECTIVE:   SUBJECTIVE STATEMENT: Pt reports a chronic Hx of R Knee pain which has progressive become worse over time from wear and tear. Additionally, she noticed her R knee was starting to give out more.   PERTINENT HISTORY: See above  PAIN:  Are you having pain? Yes: NPRS scale: 7 Pain location: R knee joint line Pain description: ache Aggravating factors: prolonged positions, bending  Relieving factors: Rest, cold pack, lidocaine  patch  PRECAUTIONS: Pt states she was told to wear her knee brace for 2 weeks  RED FLAGS: None   WEIGHT BEARING RESTRICTIONS: No  FALLS:  Has patient fallen in last 6 months? No  LIVING ENVIRONMENT: Lives with: lives alone Lives  in: House/apartment Stairs: Yes: External: 17 steps; can reach both Has following equipment at home: Single point cane  OCCUPATION: FMLA- CNA in homes  PLOF: Independent  PATIENT GOALS: To walk normal, to wear heels, and to go out drancing   NEXT MD VISIT: 01/07/24  OBJECTIVE:  Note: Objective measures were completed at Evaluation unless otherwise noted.  DIAGNOSTIC FINDINGS:  08/23/23  IMPRESSION: 1. Bucket-handle tear of the lateral meniscus flipped towards  the intercondylar notch. 2. Tricompartmental cartilage abnormalities as described above.  PATIENT SURVEYS:  LEFS; 20/80=25%  COGNITION: Overall cognitive status: Within functional limits for tasks assessed     SENSATION: WFL  EDEMA:   Swelling the R knee  MUSCLE LENGTH: Hamstrings: Right WNLs deg; Left WNLs deg  POSTURE: No Significant postural limitations  PALPATION: TTP of the anterior R knee joint spaces  LOWER EXTREMITY ROM:  Active ROM Right eval Left eval  Hip flexion    Hip extension    Hip abduction    Hip adduction    Hip internal rotation    Hip external rotation    Knee flexion 110 130  Knee extension 16 lacking 0  Ankle dorsiflexion    Ankle plantarflexion    Ankle inversion    Ankle eversion     (Blank rows = not tested)  LOWER EXTREMITY MMT:  Pt is not able to complete a R SLR MMT Right eval Left eval  Hip flexion 3 5  Hip extension 4 5  Hip abduction 4 5  Hip adduction    Hip internal rotation    Hip external rotation 4 5  Knee flexion 3- 5  Knee extension 3- 5  Ankle dorsiflexion 5 5  Ankle plantarflexion 5 5  Ankle inversion    Ankle eversion     (Blank rows = not tested)  FUNCTIONAL TESTS:  5 times sit to stand: TBA 2 minute walk test: TBA  GAIT: Distance walked: 200' Assistive device utilized: Single point cane Level of assistance: Modified independence Comments: antalgic gait pattern                                                                                                          TREATMENT DATE:  [facility] Adult PT Treatment:                                                DATE: 12/22/23 Therapeutic Exercise: Developed, instructed in, and pt completed therex as noted in HEP  Self Care: Gait training c SPC in L hand and heel to toe gait pattern Use of cold pack with toweling as needed c elevation for pain and swelling management To wear knee brace if her knee feels weak like it will give out   PATIENT EDUCATION:   Education details: Eval findings, POC, HEP, self care  Person educated: Patient Education method: Explanation, Demonstration, Tactile cues, Verbal cues, and Handouts Education comprehension: verbalized understanding, returned  demonstration, verbal cues required, and tactile cues required  HOME EXERCISE PROGRAM: Access Code: [email or web address] URL: [email or web address] Date: 12/22/2023 Prepared by: Dasie Daft  Exercises - Supine Quad Set  - 1 x daily - 7 x weekly - 1 sets - 10 reps - 5 hold - Active Straight Leg Raise with Quad Set  - 1 x daily - 7 x weekly - 1 sets - 10 reps - 3 hold - Supine Heel Slide  - 1 x daily - 7 x weekly - 1 sets - 10 reps - 5 hold - Short Arc Quad with Ankle Weight  - 1 x daily - 7 x weekly - 1 sets - 10 reps - 3 hold - Sidelying Hip Abduction  - 1 x daily - 7 x weekly - 1 sets - 10 reps - 3 hold - Hooklying Clamshell with Resistance  - 1 x daily - 7 x weekly - 1 sets - 10 reps - 3 hold  ASSESSMENT:  CLINICAL IMPRESSION: Patient is a 63 y.o. female who was seen today for physical therapy evaluation and treatment for Z98.890 (ICD-10-CM) - Status post arthroscopy of right knee with a lateral menisectomy. Pt presents with decreased R knee/LE strength, ROM and function. Pt will benefit from skilled PT 2w8 to address impairments to optimize R knee/LE function with less pain.   OBJECTIVE IMPAIRMENTS: decreased activity tolerance, decreased balance, decreased mobility, difficulty walking, decreased ROM, decreased strength, increased edema, and pain.   ACTIVITY LIMITATIONS: carrying, lifting, bending, sitting, standing, squatting, sleeping, stairs, bathing, toileting, dressing, hygiene/grooming, and locomotion level  PARTICIPATION LIMITATIONS: meal prep, cleaning, laundry, driving, shopping, and community activity  PERSONAL FACTORS: Age, Past/current experiences, and Time since onset of injury/illness/exacerbation are also affecting patient's functional  outcome.   REHAB POTENTIAL: Good  CLINICAL DECISION MAKING: Evolving/moderate complexity  EVALUATION COMPLEXITY: Moderate   GOALS:  SHORT TERM GOALS: Target date: 01/15/24 Pt will be Ind in an initial HEP  Baseline: started Goal status: INITIAL  2.  R knee AROM will improve to 5-120d to progress toward improved function Baseline: 16-110 Goal status: INITIAL  3.  Pt will be able to complete SLRs 10x as demonstration of R LE strength to walk without an assistance of a SPC  Baseline:  Goal status: INITIAL  4.  Pt will progress to walking without a SPC on level surfaces Baseline:  Goal status: INITIAL  LONG TERM GOALS: Target date: 02/23/24  Pt will be Ind in a final HEP to maintain achieved LOF  Baseline:  Goal status: INITIAL  2.  R knee AROM will improve to 0-125d to progress toward improved function with daily activities  Baseline: 16-110d Goal status: INITIAL  3.  R hip and knee strength will demonstrate of 4+ or greater to improve ability to complete daily activities Baseline: see flow sheets Goal status: INITIAL  4.  Improve 5xSTS by MCID of 5 and by MCID of 94ft as indication of improved functional mobility  Baseline: TBA Goal status: INITIAL  5.  Pt's LEFS score will increase to 50% or greater as indication of improved function  Baseline: 25% Goal status: INITIAL  6.  Pt will walk 400' or greater s an assist device and asc/dsc 12 steps c HR assist for community ambulation, and pt will do so with demonstrate a normalized gait pattern  Baseline: antalgic and c SPC Goal status: INITIAL   PLAN:  PT FREQUENCY: 2x/week  PT DURATION: 8 weeks  PLANNED INTERVENTIONS: 97164- PT Re-evaluation, 97110-Therapeutic exercises, 97530-  Therapeutic activity, [id number]- Neuromuscular re-education, [phone]- Self Care, [id number]- Manual therapy, [phone]- Gait training, [phone]- Aquatic Therapy, Patient/Family education, Balance training, Stair training, Taping, Joint mobilization,  Cryotherapy, and Moist heat  PLAN FOR NEXT SESSION: Assess 5xSTS and when pt is able to walk without a SPC; assess response to HEP; progress therex as indicated; use of modalities, manual therapy; and TPDN as indicated.  Kawhi Diebold MS, PT 12/22/23 5:55 PM  For all possible CPT codes, reference the Planned Interventions line above.     Check all conditions that are expected to impact treatment: {Conditions expected to impact treatment:Musculoskeletal disorders   If treatment provided at initial evaluation, no treatment charged due to lack of authorization.

## 2023-12-24 NOTE — Therapy (Signed)
 OUTPATIENT PHYSICAL THERAPY LOWER EXTREMITY TREATMENT   Patient Name: Bailey Patterson MRN: [id number] DOB:October 19, 1960, 63 y.o., female Today's Date: 12/25/2023  END OF SESSION:  PT End of Session - 12/25/23 0853     Visit Number 2    Number of Visits 17    Date for PT Re-Evaluation 02/23/24    Authorization Type [employer]    Authorization Time Period [employer] [place] Medicaid Approved 13 visits 12/28/23-03/27/24    Authorization - Visit Number 2    Authorization - Number of Visits 13    PT Start Time 0845    PT Stop Time 0933    PT Time Calculation (min) 48 min    Activity Tolerance Patient tolerated treatment well    Behavior During Therapy [facility] for tasks assessed/performed           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   Paresthesia 12/20/2010   Mild   Phlebitis, complicating pregnancy or puerperium    Presence of IVC filter    Rectal pain, chronic 08/11/2012   4/14    Vertigo    Past Surgical History:  Procedure Laterality Date   CESAREAN SECTION     ENDOMETRIAL ABLATION     HERNIA REPAIR     KNEE ARTHROSCOPY WITH LATERAL MENISECTOMY Right 12/03/2023   Procedure: ARTHROSCOPY, KNEE, WITH PARTIAL LATERAL MENISCECTOMY;  Surgeon: Vernetta Lonni GRADE, MD;  Location: [facility];  Service: Orthopedics;  Laterality: Right;   Patient Active Problem List   Diagnosis Date Noted   Acute lateral meniscus tear of right knee 12/03/2023   Complex tear of lateral meniscus of right knee as current  injury 12/02/2023   COVID-19 11/27/2022   Other fatigue 10/13/2022   Fibromyalgia 08/27/2022   Sleep disorder 05/28/2022   Gastroesophageal reflux disease without esophagitis 04/11/2022   Vitamin D  deficiency 12/27/2021   Postmenopausal bleeding 12/27/2021   Mild intermittent asthma without complication 12/26/2021   History of COVID-19 02/02/2020   Chronic migraine without aura, with intractable migraine, so stated, with status migrainosus 07/16/2017   Other constipation 08/11/2012    PCP: Lucius Krabbe, NP   REFERRING PROVIDER: Vernetta Lonni GRADE, MD   REFERRING DIAG: [phone] (ICD-10-CM) - Status post arthroscopy of right knee   THERAPY DIAG:  Acute pain of right knee  Muscle weakness (generalized)  Difficulty in walking, not elsewhere classified  Rationale for Evaluation and Treatment: Rehabilitation  ONSET DATE: 12/03/23- surgery  SUBJECTIVE:   SUBJECTIVE STATEMENT: Pt reports her R knee is feeling better overall. She has more pain at night.  PERTINENT HISTORY: See above  PAIN:  Are you having pain? Yes: NPRS scale: 4/10 Pain location: R knee joint line Pain description: ache Aggravating factors: prolonged positions, bending  Relieving factors: Rest, cold pack, lidocaine  patch  PRECAUTIONS: Pt states she was told to wear her knee brace for 2 weeks  RED FLAGS: None   WEIGHT BEARING RESTRICTIONS: No  FALLS:  Has patient fallen in last 6 months? No  LIVING  ENVIRONMENT: Lives with: lives alone Lives in: House/apartment Stairs: Yes: External: 17 steps; can reach both Has following equipment at home: Single point cane  OCCUPATION: FMLA- CNA in homes  PLOF: Independent  PATIENT GOALS: To walk normal, to wear heels, and to go out drancing   NEXT MD VISIT: 01/07/24  OBJECTIVE:  Note: Objective measures were completed at Evaluation unless otherwise noted.  DIAGNOSTIC FINDINGS:  08/23/23  IMPRESSION: 1. Bucket-handle tear of the lateral  meniscus flipped towards the intercondylar notch. 2. Tricompartmental cartilage abnormalities as described above.  PATIENT SURVEYS:  LEFS; 20/80=25%  COGNITION: Overall cognitive status: Within functional limits for tasks assessed     SENSATION: WFL  EDEMA:   Swelling the R knee  MUSCLE LENGTH: Hamstrings: Right WNLs deg; Left WNLs deg  POSTURE: No Significant postural limitations  PALPATION: TTP of the anterior R knee joint spaces  LOWER EXTREMITY ROM:  Active ROM Right eval Left eval Rt 12/25/23  Hip flexion     Hip extension     Hip abduction     Hip adduction     Hip internal rotation     Hip external rotation     Knee flexion 110 130 125  Knee extension 16 lacking 0 12 lacking  Ankle dorsiflexion     Ankle plantarflexion     Ankle inversion     Ankle eversion      (Blank rows = not tested)  LOWER EXTREMITY MMT:  Pt is not able to complete a R SLR MMT Right eval Left eval  Hip flexion 3 5  Hip extension 4 5  Hip abduction 4 5  Hip adduction    Hip internal rotation    Hip external rotation 4 5  Knee flexion 3- 5  Knee extension 3- 5  Ankle dorsiflexion 5 5  Ankle plantarflexion 5 5  Ankle inversion    Ankle eversion     (Blank rows = not tested)  FUNCTIONAL TESTS:  5 times sit to stand: TBA 2 minute walk test: TBA  GAIT: Distance walked: 200' Assistive device utilized: Single point cane Level of assistance: Modified independence Comments: antalgic gait pattern                                                                                                          TREATMENT DATE:  [facility] Adult PT Treatment:                                                DATE: 12/25/23 Therapeutic Exercise: Supine Quad Set 10 reps - 5 hold Active Straight Leg Raise with Quad Set 10 reps - 3 hold Supine Heel Slide  - 1 x daily - 7 x weekly - 1 sets - 10 reps - 5 hold Short Arc Quad 10 reps - 3 hold Sidelying Hip Abduction 10 reps - 3 hold Hooklying  Clamshell with Resistance 10 reps - 3 hold Modalities:  Cold pack to the R knee c elevation x10 mins  [facility] Adult PT Treatment:                                                DATE: 12/22/23 Therapeutic Exercise: Developed, instructed in, and pt completed therex as noted in HEP  Self Care: Gait training c SPC in L hand and heel to toe gait pattern Use of cold pack with toweling as needed c elevation for pain and swelling management To wear knee brace if her knee feels weak like it will give out   PATIENT EDUCATION:  Education details: Eval findings, POC, HEP, self care  Person educated: Patient Education method: Explanation, Demonstration, Tactile cues, Verbal cues, and Handouts Education comprehension: verbalized understanding, returned demonstration, verbal cues required, and tactile cues required  HOME EXERCISE PROGRAM: Access Code: [email or web address] URL: [email or web address] Date: 12/22/2023 Prepared by: Dasie Daft  Exercises - Supine Quad Set  - 1 x daily - 7 x weekly - 1 sets - 10 reps - 5 hold - Active Straight Leg Raise with Quad Set  - 1 x daily - 7 x weekly - 1 sets - 10 reps - 3 hold - Supine Heel Slide  - 1 x daily - 7 x weekly - 1 sets - 10 reps - 5 hold - Short Arc Quad with Ankle Weight  - 1 x daily - 7 x weekly - 1 sets - 10 reps - 3 hold - Sidelying Hip Abduction  - 1 x daily - 7 x weekly - 1 sets - 10 reps - 3 hold - Hooklying Clamshell with Resistance  - 1 x daily - 7 x weekly - 1 sets - 10 reps - 3 hold  ASSESSMENT:  CLINICAL IMPRESSION: Pt was able to engage her quad muscle and complete 10 SLRS s quad lag. AROM for both knee flexion and ext have improved. Appears improving ext AROM may be more difficult than flexion. Pt presented to PT walking without the [facility], but was encouraged to use a SPC for extended time on her feet or it she experiences the R knee giving out. Pt has made good progress since the eval. Pt will continue to benefit from skilled PT to  address impairments for improved R knee/LE function c minimized pain.   EVAL: Patient is a 63 y.o. female who was seen today for physical therapy evaluation and treatment for Z98.890 (ICD-10-CM) - Status post arthroscopy of right knee with a lateral menisectomy. Pt presents with decreased R knee/LE strength, ROM and function. Pt will benefit from skilled PT 2w8 to address impairments to optimize R knee/LE function with less pain.   OBJECTIVE IMPAIRMENTS: decreased activity tolerance, decreased balance, decreased mobility, difficulty walking, decreased ROM, decreased strength, increased edema, and pain.   ACTIVITY LIMITATIONS: carrying, lifting, bending, sitting, standing, squatting, sleeping, stairs, bathing, toileting, dressing, hygiene/grooming, and locomotion level  PARTICIPATION LIMITATIONS: meal prep, cleaning, laundry, driving, shopping, and community activity  PERSONAL FACTORS: Age, Past/current experiences, and Time since onset of injury/illness/exacerbation are also affecting patient's functional outcome.   REHAB POTENTIAL: Good  CLINICAL DECISION MAKING: Evolving/moderate complexity  EVALUATION COMPLEXITY: Moderate   GOALS:  SHORT TERM GOALS: Target date: 01/15/24 Pt will be Ind in an initial HEP  Baseline: started Goal status: INITIAL  2.  R knee AROM will improve to 5-120d to progress toward improved  function Baseline: 16-110 Goal status: INITIAL  3.  Pt will be able to complete SLRs 10x as demonstration of R LE strength to walk without an assistance of a SPC  Baseline:  Goal status: INITIAL  4.  Pt will progress to walking without a SPC on level surfaces Baseline:  Goal status: INITIAL  LONG TERM GOALS: Target date: 02/23/24  Pt will be Ind in a final HEP to maintain achieved LOF  Baseline:  Goal status: INITIAL  2.  R knee AROM will improve to 0-125d to progress toward improved function with daily activities  Baseline: 16-110d Goal status: INITIAL  3.  R  hip and knee strength will demonstrate of 4+ or greater to improve ability to complete daily activities Baseline: see flow sheets Goal status: INITIAL  4.  Improve 5xSTS by MCID of 5 and by MCID of 50ft as indication of improved functional mobility  Baseline: TBA Goal status: INITIAL  5.  Pt's LEFS score will increase to 50% or greater as indication of improved function  Baseline: 25% Goal status: INITIAL  6.  Pt will walk 400' or greater s an assist device and asc/dsc 12 steps c HR assist for community ambulation, and pt will do so with demonstrate a normalized gait pattern  Baseline: antalgic and c SPC Goal status: INITIAL   PLAN:  PT FREQUENCY: 2x/week  PT DURATION: 8 weeks  PLANNED INTERVENTIONS: 97164- PT Re-evaluation, 97110-Therapeutic exercises, 97530- Therapeutic activity, 97112- Neuromuscular re-education, 97535- Self Care, 02859- Manual therapy, [phone]- Gait training, [phone]- Aquatic Therapy, Patient/Family education, Balance training, Stair training, Taping, Joint mobilization, Cryotherapy, and Moist heat  PLAN FOR NEXT SESSION: Assess 5xSTS and when pt is able to walk without a SPC; assess response to HEP; progress therex as indicated; use of modalities, manual therapy; and TPDN as indicated.  Vennessa Affinito MS, PT 12/25/23 12:00 PM

## 2023-12-25 ENCOUNTER — Ambulatory Visit

## 2023-12-25 DIAGNOSIS — M6281 Muscle weakness (generalized): Secondary | ICD-10-CM

## 2023-12-25 DIAGNOSIS — M25561 Pain in right knee: Secondary | ICD-10-CM | POA: Diagnosis not present

## 2023-12-25 DIAGNOSIS — R262 Difficulty in walking, not elsewhere classified: Secondary | ICD-10-CM

## 2023-12-29 ENCOUNTER — Ambulatory Visit: Admitting: Physical Therapy

## 2023-12-29 ENCOUNTER — Encounter: Payer: Self-pay | Admitting: Physical Therapy

## 2023-12-29 DIAGNOSIS — M25561 Pain in right knee: Secondary | ICD-10-CM

## 2023-12-29 DIAGNOSIS — R262 Difficulty in walking, not elsewhere classified: Secondary | ICD-10-CM

## 2023-12-29 DIAGNOSIS — M6281 Muscle weakness (generalized): Secondary | ICD-10-CM

## 2023-12-29 NOTE — Therapy (Signed)
 OUTPATIENT PHYSICAL THERAPY LOWER EXTREMITY TREATMENT   Patient Name: Bailey Patterson MRN: [id number] DOB:January 19, 1961, 63 y.o., female Today's Date: 12/29/2023  END OF SESSION:  PT End of Session - 12/29/23 0932     Visit Number 3    Number of Visits 17    Date for PT Re-Evaluation 02/23/24    Authorization Type [employer]    Authorization Time Period [employer] [place] Medicaid Approved 13 visits 12/28/23-03/27/24    Authorization - Visit Number 3    Authorization - Number of Visits 13    PT Start Time 0930    PT Stop Time 1012    PT Time Calculation (min) 42 min    Activity Tolerance Patient tolerated treatment well    Behavior During Therapy [facility] for tasks assessed/performed           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   Paresthesia 12/20/2010   Mild   Phlebitis, complicating pregnancy or puerperium    Presence of IVC filter    Rectal pain, chronic 08/11/2012   4/14    Vertigo    Past Surgical History:  Procedure Laterality Date   CESAREAN SECTION     ENDOMETRIAL ABLATION     HERNIA REPAIR     KNEE ARTHROSCOPY WITH LATERAL MENISECTOMY Right 12/03/2023   Procedure: ARTHROSCOPY, KNEE, WITH PARTIAL LATERAL MENISCECTOMY;  Surgeon: Vernetta Lonni GRADE, MD;  Location: [facility];  Service: Orthopedics;  Laterality: Right;   Patient Active Problem List   Diagnosis Date Noted   Acute lateral meniscus tear of right knee 12/03/2023   Complex tear of lateral meniscus of right knee as current  injury 12/02/2023   COVID-19 11/27/2022   Other fatigue 10/13/2022   Fibromyalgia 08/27/2022   Sleep disorder 05/28/2022   Gastroesophageal reflux disease without esophagitis 04/11/2022   Vitamin D  deficiency 12/27/2021   Postmenopausal bleeding 12/27/2021   Mild intermittent asthma without complication 12/26/2021   History of COVID-19 02/02/2020   Chronic migraine without aura, with intractable migraine, so stated, with status migrainosus 07/16/2017   Other constipation 08/11/2012    PCP: Lucius Krabbe, NP   REFERRING PROVIDER: Vernetta Lonni GRADE, MD   REFERRING DIAG: [phone] (ICD-10-CM) - Status post arthroscopy of right knee   THERAPY DIAG:  Acute pain of right knee  Muscle weakness (generalized)  Difficulty in walking, not elsewhere classified  Rationale for Evaluation and Treatment: Rehabilitation  ONSET DATE: 12/03/23- surgery  SUBJECTIVE:   SUBJECTIVE STATEMENT: Pt reports that she continues to have some R knee pain which is improving.  She has stopped using the cane and is doing well with this.  PERTINENT HISTORY: See above  PAIN:  Are you having pain? Yes: NPRS scale: 4/10 Pain location: R knee joint line Pain description: ache Aggravating factors: prolonged positions, bending  Relieving factors: Rest, cold pack, lidocaine  patch  PRECAUTIONS: Pt states she was told to wear her knee brace for 2 weeks  RED FLAGS: None   WEIGHT BEARING RESTRICTIONS: No  FALLS:  Has patient fallen in last 6 months? No  LIVING ENVIRONMENT: Lives with: lives alone Lives in: House/apartment Stairs: Yes: External: 17 steps; can reach both Has following equipment at home: Single point cane  OCCUPATION: FMLA- CNA in homes  PLOF: Independent  PATIENT GOALS: To walk normal, to wear heels, and to go out drancing   NEXT MD VISIT: 01/07/24  OBJECTIVE:  Note: Objective measures were completed at Evaluation unless otherwise noted.  DIAGNOSTIC FINDINGS:   08/23/23  IMPRESSION: 1. Bucket-handle tear of the lateral meniscus flipped towards the intercondylar notch. 2. Tricompartmental cartilage abnormalities as described above.  PATIENT SURVEYS:  LEFS; 20/80=25%  COGNITION: Overall cognitive status: Within functional limits for tasks assessed     SENSATION: WFL  EDEMA:   Swelling the R knee  MUSCLE LENGTH: Hamstrings: Right WNLs deg; Left WNLs deg  POSTURE: No Significant postural limitations  PALPATION: TTP of the anterior R knee joint spaces  LOWER EXTREMITY ROM:  Active ROM Right eval Left eval Rt 12/25/23 Rt 8/19  Hip flexion      Hip extension      Hip abduction      Hip adduction      Hip internal rotation      Hip external rotation      Knee flexion 110 130 125   Knee extension 16 lacking 0 12 lacking 5 lacking  Ankle dorsiflexion      Ankle plantarflexion      Ankle inversion      Ankle eversion       (Blank rows = not tested)  LOWER EXTREMITY MMT:  Pt is not able to complete a R SLR MMT Right eval Left eval  Hip flexion 3 5  Hip extension 4 5  Hip abduction 4 5  Hip adduction    Hip internal rotation    Hip external rotation 4 5  Knee flexion 3- 5  Knee extension 3- 5  Ankle dorsiflexion 5 5  Ankle plantarflexion 5 5  Ankle inversion    Ankle eversion     (Blank rows = not tested)  FUNCTIONAL TESTS:  5 times sit to stand: TBA 2 minute walk test: TBA  GAIT: Distance walked: 200' Assistive device utilized: Single point cane Level of assistance: Modified independence Comments: antalgic gait pattern                                                                                                          TREATMENT DATE:  [facility] Adult PT Treatment:                                                DATE: 12/29/23 Therapeutic Exercise: Supine Quad Set 10 reps - 5 hold Active Straight Leg Raise with Quad set x10, 2x10 - 2# S/L hip abd - R - 2x15 Bridge on ball - 2x10 - 3'' hold Supine Heel Slide    1  sets - 10 reps - 5 hold Short Arc Quad x10 reps - 3 hold TKE into bosu - seated - 2x10 Standing TKE - blue TB    [facility] Adult PT Treatment:                                                DATE: 12/22/23 Therapeutic Exercise: Developed, instructed in, and pt completed therex as noted in HEP  Self Care: Gait training c SPC in L hand and heel to toe gait pattern Use of cold pack with toweling as needed c elevation for pain and swelling management To wear knee brace if her knee feels weak like it will give out   PATIENT EDUCATION:  Education details: Eval findings, POC, HEP, self care  Person educated: Patient Education method: Explanation, Demonstration, Tactile cues, Verbal cues, and Handouts Education comprehension: verbalized understanding, returned demonstration, verbal cues required, and tactile cues required  HOME EXERCISE PROGRAM: Access Code: [email or web address] URL: [email or web address] Date: 12/22/2023 Prepared by: Dasie Daft  Exercises - Supine Quad Set  - 1 x daily - 7 x weekly - 1 sets - 10 reps - 5 hold - Active Straight Leg Raise with Quad Set  - 1 x daily - 7 x weekly - 1 sets - 10 reps - 3 hold - Supine Heel Slide  - 1 x daily - 7 x weekly - 1 sets - 10 reps - 5 hold - Short Arc Quad with Ankle Weight  - 1 x daily - 7 x weekly - 1 sets - 10 reps - 3 hold - Sidelying Hip Abduction  - 1 x daily - 7 x weekly - 1 sets - 10 reps - 3 hold - Hooklying Clamshell with Resistance  - 1 x daily - 7 x weekly - 1 sets - 10 reps - 3 hold  ASSESSMENT:  CLINICAL IMPRESSION: Tomeeka tolerated session well with no adverse reaction.  Good quad activation today.  Knee ext improved to lacking only 5 degrees.  Minor pain with SAQ and completed in comfortable range.  Discussion regarding limiting painful activities and CC knee flexion for now.  EVAL: Patient is a 63 y.o. female who was seen today for physical therapy evaluation and treatment for Z98.890 (ICD-10-CM) - Status post  arthroscopy of right knee with a lateral menisectomy. Pt presents with decreased R knee/LE strength, ROM and function. Pt will benefit from skilled PT 2w8 to address impairments to optimize R knee/LE function with less pain.   OBJECTIVE IMPAIRMENTS: decreased activity tolerance, decreased balance, decreased mobility, difficulty walking, decreased ROM, decreased strength, increased edema, and pain.   ACTIVITY LIMITATIONS: carrying, lifting, bending, sitting, standing, squatting, sleeping, stairs, bathing, toileting, dressing, hygiene/grooming, and locomotion level  PARTICIPATION LIMITATIONS: meal prep, cleaning, laundry, driving, shopping, and community activity  PERSONAL FACTORS: Age, Past/current experiences, and Time since onset of injury/illness/exacerbation are also affecting patient's functional outcome.   REHAB POTENTIAL: Good  CLINICAL DECISION MAKING: Evolving/moderate complexity  EVALUATION COMPLEXITY: Moderate   GOALS:  SHORT TERM GOALS: Target date: 01/15/24 Pt will be Ind in an initial HEP  Baseline: started Goal status: INITIAL  2.  R knee AROM will improve to 5-120d to progress toward improved function Baseline: 16-110 Goal status: INITIAL  3.  Pt will be able to complete SLRs 10x as demonstration of R LE strength to walk without an assistance of  a SPC  Baseline:  Goal status: INITIAL  4.  Pt will progress to walking without a SPC on level surfaces Baseline:  Goal status: INITIAL  LONG TERM GOALS: Target date: 02/23/24  Pt will be Ind in a final HEP to maintain achieved LOF  Baseline:  Goal status: INITIAL  2.  R knee AROM will improve to 0-125d to progress toward improved function with daily activities  Baseline: 16-110d Goal status: INITIAL  3.  R hip and knee strength will demonstrate of 4+ or greater to improve ability to complete daily activities Baseline: see flow sheets Goal status: INITIAL  4.  Improve 5xSTS by MCID of 5 and by MCID of 40ft  as indication of improved functional mobility  Baseline: TBA Goal status: INITIAL  5.  Pt's LEFS score will increase to 50% or greater as indication of improved function  Baseline: 25% Goal status: INITIAL  6.  Pt will walk 400' or greater s an assist device and asc/dsc 12 steps c HR assist for community ambulation, and pt will do so with demonstrate a normalized gait pattern  Baseline: antalgic and c SPC Goal status: INITIAL   PLAN:  PT FREQUENCY: 2x/week  PT DURATION: 8 weeks  PLANNED INTERVENTIONS: 97164- PT Re-evaluation, 97110-Therapeutic exercises, 97530- Therapeutic activity, 97112- Neuromuscular re-education, 97535- Self Care, 02859- Manual therapy, [phone]- Gait training, [phone]- Aquatic Therapy, Patient/Family education, Balance training, Stair training, Taping, Joint mobilization, Cryotherapy, and Moist heat  PLAN FOR NEXT SESSION: Assess 5xSTS and when pt is able to walk without a SPC; assess response to HEP; progress therex as indicated; use of modalities, manual therapy; and TPDN as indicated.  Deetra Booton E Anastyn Ayars PT 12/29/23 10:15 AM

## 2023-12-31 ENCOUNTER — Encounter: Payer: Self-pay | Admitting: Physical Therapy

## 2023-12-31 ENCOUNTER — Ambulatory Visit: Admitting: Physical Therapy

## 2023-12-31 DIAGNOSIS — M25561 Pain in right knee: Secondary | ICD-10-CM | POA: Diagnosis not present

## 2023-12-31 DIAGNOSIS — M6281 Muscle weakness (generalized): Secondary | ICD-10-CM

## 2023-12-31 DIAGNOSIS — R262 Difficulty in walking, not elsewhere classified: Secondary | ICD-10-CM

## 2023-12-31 NOTE — Therapy (Signed)
 OUTPATIENT PHYSICAL THERAPY LOWER EXTREMITY TREATMENT   Patient Name: Bailey Patterson MRN: [id number] DOB:03-01-1961, 63 y.o., female Today's Date: 12/31/2023  END OF SESSION:  PT End of Session - 12/31/23 1008     Visit Number 4    Number of Visits 17    Date for PT Re-Evaluation 02/23/24    Authorization Type [employer]    Authorization Time Period [employer] [place] Medicaid Approved 13 visits 12/28/23-03/27/24    Authorization - Visit Number 4    Authorization - Number of Visits 13    PT Start Time 1015    PT Stop Time 1056    PT Time Calculation (min) 41 min    Activity Tolerance Patient tolerated treatment well    Behavior During Therapy [facility] for tasks assessed/performed           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   Paresthesia 12/20/2010   Mild   Phlebitis, complicating pregnancy or puerperium    Presence of IVC filter    Rectal pain, chronic 08/11/2012   4/14    Vertigo    Past Surgical History:  Procedure Laterality Date   CESAREAN SECTION     ENDOMETRIAL ABLATION     HERNIA REPAIR     KNEE ARTHROSCOPY WITH LATERAL MENISECTOMY Right 12/03/2023   Procedure: ARTHROSCOPY, KNEE, WITH PARTIAL LATERAL MENISCECTOMY;  Surgeon: Vernetta Lonni GRADE, MD;  Location: [facility];  Service: Orthopedics;  Laterality: Right;   Patient Active Problem List   Diagnosis Date Noted   Acute lateral meniscus tear of right knee 12/03/2023   Complex tear of lateral meniscus of right knee as current  injury 12/02/2023   COVID-19 11/27/2022   Other fatigue 10/13/2022   Fibromyalgia 08/27/2022   Sleep disorder 05/28/2022   Gastroesophageal reflux disease without esophagitis 04/11/2022   Vitamin D  deficiency 12/27/2021   Postmenopausal bleeding 12/27/2021   Mild intermittent asthma without complication 12/26/2021   History of COVID-19 02/02/2020   Chronic migraine without aura, with intractable migraine, so stated, with status migrainosus 07/16/2017   Other constipation 08/11/2012    PCP: Lucius Krabbe, NP   REFERRING PROVIDER: Vernetta Lonni GRADE, MD   REFERRING DIAG: [phone] (ICD-10-CM) - Status post arthroscopy of right knee   THERAPY DIAG:  Acute pain of right knee  Muscle weakness (generalized)  Difficulty in walking, not elsewhere classified  Rationale for Evaluation and Treatment: Rehabilitation  ONSET DATE: 12/03/23- surgery  SUBJECTIVE:   SUBJECTIVE STATEMENT: Pt reports that her knee is a bit more stiff today, possibly because she has been walking and driving more.  PERTINENT HISTORY: See above  PAIN:  Are you having pain? Yes: NPRS scale: 4/10 Pain location: R knee joint line Pain description: ache Aggravating factors: prolonged positions, bending  Relieving factors: Rest, cold pack, lidocaine  patch  PRECAUTIONS: Pt states she was told to wear her knee brace for 2 weeks  RED FLAGS: None   WEIGHT BEARING RESTRICTIONS: No  FALLS:  Has patient fallen in last  6 months? No  LIVING ENVIRONMENT: Lives with: lives alone Lives in: House/apartment Stairs: Yes: External: 17 steps; can reach both Has following equipment at home: Single point cane  OCCUPATION: FMLA- CNA in homes  PLOF: Independent  PATIENT GOALS: To walk normal, to wear heels, and to go out drancing   NEXT MD VISIT: 01/07/24  OBJECTIVE:  Note: Objective measures were completed at Evaluation unless otherwise noted.  DIAGNOSTIC FINDINGS:  08/23/23  IMPRESSION: 1.  Bucket-handle tear of the lateral meniscus flipped towards the intercondylar notch. 2. Tricompartmental cartilage abnormalities as described above.  PATIENT SURVEYS:  LEFS; 20/80=25%  COGNITION: Overall cognitive status: Within functional limits for tasks assessed     SENSATION: WFL  EDEMA:   Swelling the R knee  MUSCLE LENGTH: Hamstrings: Right WNLs deg; Left WNLs deg  POSTURE: No Significant postural limitations  PALPATION: TTP of the anterior R knee joint spaces  LOWER EXTREMITY ROM:  Active ROM Right eval Left eval Rt 12/25/23 Rt 8/19  Hip flexion      Hip extension      Hip abduction      Hip adduction      Hip internal rotation      Hip external rotation      Knee flexion 110 130 125   Knee extension 16 lacking 0 12 lacking 5 lacking  Ankle dorsiflexion      Ankle plantarflexion      Ankle inversion      Ankle eversion       (Blank rows = not tested)  LOWER EXTREMITY MMT:  Pt is not able to complete a R SLR MMT Right eval Left eval  Hip flexion 3 5  Hip extension 4 5  Hip abduction 4 5  Hip adduction    Hip internal rotation    Hip external rotation 4 5  Knee flexion 3- 5  Knee extension 3- 5  Ankle dorsiflexion 5 5  Ankle plantarflexion 5 5  Ankle inversion    Ankle eversion     (Blank rows = not tested)  FUNCTIONAL TESTS:  5 times sit to stand: TBA 2 minute walk test: TBA  GAIT: Distance walked: 200' Assistive device utilized: Single point cane Level of assistance: Modified independence Comments: antalgic gait pattern                                                                                                          TREATMENT DATE:  [facility] Adult PT Treatment:                                                DATE: 12/31/23 Therapeutic Exercise: nu-step L5 41m while taking subjective and planning session with patient Supine Quad Set 10 reps - 5'' hold w/ heel prop Active Straight Leg Raise with Quad 2x10 - 2# S/L hip abd - R - 2x10 -  2# Bridge on ball - 2x10 - 5''  hold Supine Heel Slide   1 sets - 10 reps - 5 hold Short Arc Quad x10 reps - 3 hold - 2# 90 degree quad isometric - 5'' hold - 2x10    [facility] Adult PT Treatment:                                                DATE: 12/22/23 Therapeutic Exercise: Developed, instructed in, and pt completed therex as noted in HEP  Self Care: Gait training c SPC in L hand and heel to toe gait pattern Use of cold pack with toweling as needed c elevation for pain and swelling management To wear knee brace if her knee feels weak like it will give out   PATIENT EDUCATION:  Education details: Eval findings, POC, HEP, self care  Person educated: Patient Education method: Explanation, Demonstration, Tactile cues, Verbal cues, and Handouts Education comprehension: verbalized understanding, returned demonstration, verbal cues required, and tactile cues required  HOME EXERCISE PROGRAM: Access Code: [email or web address] URL: [email or web address] Date: 12/22/2023 Prepared by: Dasie Daft  Exercises - Supine Quad Set  - 1 x daily - 7 x weekly - 1 sets - 10 reps - 5 hold - Active Straight Leg Raise with Quad Set  - 1 x daily - 7 x weekly - 1 sets - 10 reps - 3 hold - Supine Heel Slide  - 1 x daily - 7 x weekly - 1 sets - 10 reps - 5 hold - Short Arc Quad with Ankle Weight  - 1 x daily - 7 x weekly - 1 sets - 10 reps - 3 hold - Sidelying Hip Abduction  - 1 x daily - 7 x weekly - 1 sets - 10 reps - 3 hold - Hooklying Clamshell with Resistance  - 1 x daily - 7 x weekly - 1 sets - 10 reps - 3 hold  ASSESSMENT:  CLINICAL IMPRESSION: Promise tolerated session well with no adverse reaction.  Progressing as expected.  Able to add weights to hip abd and SAQ with no issue.  Reiterated the importance of avoiding closed chain body weight exercises involving significant knee flexion such as steps and squatting if they cause increased pain.  EVAL: Patient is a 63 y.o. female who was seen today  for physical therapy evaluation and treatment for Z98.890 (ICD-10-CM) - Status post arthroscopy of right knee with a lateral menisectomy. Pt presents with decreased R knee/LE strength, ROM and function. Pt will benefit from skilled PT 2w8 to address impairments to optimize R knee/LE function with less pain.   OBJECTIVE IMPAIRMENTS: decreased activity tolerance, decreased balance, decreased mobility, difficulty walking, decreased ROM, decreased strength, increased edema, and pain.   ACTIVITY LIMITATIONS: carrying, lifting, bending, sitting, standing, squatting, sleeping, stairs, bathing, toileting, dressing, hygiene/grooming, and locomotion level  PARTICIPATION LIMITATIONS: meal prep, cleaning, laundry, driving, shopping, and community activity  PERSONAL FACTORS: Age, Past/current experiences, and Time since onset of injury/illness/exacerbation are also affecting patient's functional outcome.   REHAB POTENTIAL: Good  CLINICAL DECISION MAKING: Evolving/moderate complexity  EVALUATION COMPLEXITY: Moderate   GOALS:  SHORT TERM GOALS: Target date: 01/15/24 Pt will be Ind in an initial HEP  Baseline: started Goal status: INITIAL  2.  R knee AROM will improve to 5-120d to progress toward improved function Baseline: 16-110 Goal status: INITIAL  3.  Pt will be able to complete SLRs 10x  as demonstration of R LE strength to walk without an assistance of a SPC  Baseline:  Goal status: INITIAL  4.  Pt will progress to walking without a SPC on level surfaces Baseline:  Goal status: INITIAL  LONG TERM GOALS: Target date: 02/23/24  Pt will be Ind in a final HEP to maintain achieved LOF  Baseline:  Goal status: INITIAL  2.  R knee AROM will improve to 0-125d to progress toward improved function with daily activities  Baseline: 16-110d Goal status: INITIAL  3.  R hip and knee strength will demonstrate of 4+ or greater to improve ability to complete daily activities Baseline: see flow  sheets Goal status: INITIAL  4.  Improve 5xSTS by MCID of 5 and by MCID of 49ft as indication of improved functional mobility  Baseline: TBA Goal status: INITIAL  5.  Pt's LEFS score will increase to 50% or greater as indication of improved function  Baseline: 25% Goal status: INITIAL  6.  Pt will walk 400' or greater s an assist device and asc/dsc 12 steps c HR assist for community ambulation, and pt will do so with demonstrate a normalized gait pattern  Baseline: antalgic and c SPC Goal status: INITIAL   PLAN:  PT FREQUENCY: 2x/week  PT DURATION: 8 weeks  PLANNED INTERVENTIONS: 97164- PT Re-evaluation, 97110-Therapeutic exercises, 97530- Therapeutic activity, 97112- Neuromuscular re-education, 97535- Self Care, 02859- Manual therapy, [phone]- Gait training, [phone]- Aquatic Therapy, Patient/Family education, Balance training, Stair training, Taping, Joint mobilization, Cryotherapy, and Moist heat  PLAN FOR NEXT SESSION: Assess 5xSTS and when pt is able to walk without a SPC; assess response to HEP; progress therex as indicated; use of modalities, manual therapy; and TPDN as indicated.  Marshawn Normoyle E Malkie Wille PT 12/31/23 11:26 AM

## 2024-01-04 NOTE — Therapy (Signed)
 OUTPATIENT PHYSICAL THERAPY LOWER EXTREMITY TREATMENT   Patient Name: Bailey Patterson MRN: [id number] DOB:Nov 22, 1960, 63 y.o., female Today's Date: 01/05/2024  END OF SESSION:  PT End of Session - 01/05/24 1026     Visit Number 5    Number of Visits 17    Date for PT Re-Evaluation 02/23/24    Authorization Type [employer]    Authorization Time Period [employer] [place] Medicaid Approved 13 visits 12/28/23-03/27/24    Authorization - Visit Number 5    Authorization - Number of Visits 13    PT Start Time 1024    PT Stop Time 1112    PT Time Calculation (min) 48 min    Activity Tolerance Patient tolerated treatment well    Behavior During Therapy [facility] for tasks assessed/performed            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   Paresthesia 12/20/2010   Mild   Phlebitis, complicating pregnancy or puerperium    Presence of IVC filter    Rectal pain, chronic 08/11/2012   4/14    Vertigo    Past Surgical History:  Procedure Laterality Date   CESAREAN SECTION     ENDOMETRIAL ABLATION     HERNIA REPAIR     KNEE ARTHROSCOPY WITH LATERAL MENISECTOMY Right 12/03/2023   Procedure: ARTHROSCOPY, KNEE, WITH PARTIAL LATERAL MENISCECTOMY;  Surgeon: Vernetta Lonni GRADE, MD;  [facility];  Service: Orthopedics;  Laterality: Right;   Patient Active Problem List   Diagnosis Date Noted   Acute lateral meniscus tear of right knee 12/03/2023   Complex tear of lateral meniscus of right knee as current  injury 12/02/2023   COVID-19 11/27/2022   Other fatigue 10/13/2022   Fibromyalgia 08/27/2022   Sleep disorder 05/28/2022   Gastroesophageal reflux disease without esophagitis 04/11/2022   Vitamin D  deficiency 12/27/2021   Postmenopausal bleeding 12/27/2021   Mild intermittent asthma without complication 12/26/2021   History of COVID-19 02/02/2020   Chronic migraine without aura, with intractable migraine, so stated, with status migrainosus 07/16/2017   Other constipation 08/11/2012    PCP: Lucius Krabbe, NP   REFERRING PROVIDER: Vernetta Lonni GRADE, MD   REFERRING DIAG: [phone] (ICD-10-CM) - Status post arthroscopy of right knee   THERAPY DIAG:  Acute pain of right knee  Muscle weakness (generalized)  Difficulty in walking, not elsewhere classified  Rationale for Evaluation and Treatment: Rehabilitation  ONSET DATE: 12/03/23- surgery  SUBJECTIVE:   SUBJECTIVE STATEMENT: Pt reports attending a school open house with increased time on her feet which aggravated her R knee and increased the swelling. Pt notes occasional popping of the R knee.  PERTINENT HISTORY: See above  PAIN:  Are you having pain? Yes: NPRS scale: 2/10 Pain location: R knee joint line Pain description: ache Aggravating factors: prolonged positions, bending  Relieving factors: Rest, cold pack, lidocaine  patch  PRECAUTIONS: Pt states she was told to wear her knee brace for 2 weeks  RED FLAGS: None   WEIGHT  BEARING RESTRICTIONS: No  FALLS:  Has patient fallen in last 6 months? No  LIVING ENVIRONMENT: Lives with: lives alone Lives in: House/apartment Stairs: Yes: External: 17 steps; can reach both Has following equipment at home: Single point cane  OCCUPATION: FMLA- CNA in homes  PLOF: Independent  PATIENT GOALS: To walk normal, to wear heels, and to go out drancing   NEXT MD VISIT: 01/07/24  OBJECTIVE:  Note: Objective measures were completed at Evaluation unless otherwise  noted.  DIAGNOSTIC FINDINGS:  08/23/23  IMPRESSION: 1. Bucket-handle tear of the lateral meniscus flipped towards the intercondylar notch. 2. Tricompartmental cartilage abnormalities as described above.  PATIENT SURVEYS:  LEFS; 20/80=25%  COGNITION: Overall cognitive status: Within functional limits for tasks assessed     SENSATION: WFL  EDEMA:   Swelling the R knee  MUSCLE LENGTH: Hamstrings: Right WNLs deg; Left WNLs deg  POSTURE: No Significant postural limitations  PALPATION: TTP of the anterior R knee joint spaces  LOWER EXTREMITY ROM:  Active ROM Right eval Left eval Rt 12/25/23 Rt 8/19 RT 01/05/24  Hip flexion       Hip extension       Hip abduction       Hip adduction       Hip internal rotation       Hip external rotation       Knee flexion 110 130 125  128  Knee extension 16 lacking 0 12 lacking 5 lacking 3 lacking  Ankle dorsiflexion       Ankle plantarflexion       Ankle inversion       Ankle eversion        (Blank rows = not tested)  LOWER EXTREMITY MMT:  Pt is not able to complete a R SLR MMT Right eval Left eval  Hip flexion 3 5  Hip extension 4 5  Hip abduction 4 5  Hip adduction    Hip internal rotation    Hip external rotation 4 5  Knee flexion 3- 5  Knee extension 3- 5  Ankle dorsiflexion 5 5  Ankle plantarflexion 5 5  Ankle inversion    Ankle eversion     (Blank rows = not tested)  FUNCTIONAL TESTS:  5 times sit to stand: TBA 2 minute walk test: TBA  GAIT: Distance walked: 200' Assistive device utilized: Single point cane Level of assistance: Modified independence Comments: antalgic gait pattern                                                                                                          TREATMENT DATE:  [facility] Adult PT Treatment:                                                DATE: 01/05/24 Therapeutic Exercise: nu-step L5 39m while taking subjective and planning session with patient Supine Quad Set 10 reps -  5'' hold w/ heel  prop Active Straight Leg Raise with Quad 2x10 - 2# S/L hip abd - R - 2x10 - 2# Supine Heel Slide   1 sets - 10 reps - 5 hold Short Arc Quad x10 reps - 3 hold - 2# Supine Hamstring stretch c strap  Manual Therapy: Grade ll PA mobs femur on tibia Self care: Self care re: managing activity to minimize pain and swelling of the R knee    [facility] Adult PT Treatment:                                                DATE: 12/31/23 Therapeutic Exercise: nu-step L5 70m while taking subjective and planning session with patient Supine Quad Set 10 reps - 5'' hold w/ heel prop Active Straight Leg Raise with Quad 2x10 - 2# S/L hip abd - R - 2x10 - 2# Bridge on ball - 2x10 - 5'' hold Supine Heel Slide   1 sets - 10 reps - 5 hold Short Arc Quad x10 reps - 3 hold - 2# 90 degree quad isometric - 5'' hold - 2x10  [facility] Adult PT Treatment:                                                DATE: 12/22/23 Therapeutic Exercise: Developed, instructed in, and pt completed therex as noted in HEP  Self Care: Gait training c SPC in L hand and heel to toe gait pattern Use of cold pack with toweling as needed c elevation for pain and swelling management To wear knee brace if her knee feels weak like it will give out   PATIENT EDUCATION:  Education details: Eval findings, POC, HEP, self care  Person educated: Patient Education method: Explanation, Demonstration, Tactile cues, Verbal cues, and Handouts Education comprehension: verbalized understanding, returned demonstration, verbal cues required, and tactile cues required  HOME EXERCISE PROGRAM: Access Code: [email or web address] URL: [email or web address] Date: 12/22/2023 Prepared by: Dasie Daft  Exercises - Supine Quad Set  - 1 x daily - 7 x weekly - 1 sets - 10 reps - 5 hold - Active Straight Leg Raise with Quad Set  - 1 x daily - 7 x weekly - 1 sets - 10 reps - 3 hold - Supine Heel Slide  - 1 x daily - 7 x weekly - 1 sets - 10 reps - 5  hold - Short Arc Quad with Ankle Weight  - 1 x daily - 7 x weekly - 1 sets - 10 reps - 3 hold - Sidelying Hip Abduction  - 1 x daily - 7 x weekly - 1 sets - 10 reps - 3 hold - Hooklying Clamshell with Resistance  - 1 x daily - 7 x weekly - 1 sets - 10 reps - 3 hold  ASSESSMENT:  CLINICAL IMPRESSION: PT focused of improving R knee ext AROM which was 5 lacking at the beginning of the session. Min improvement was achieved with intervention. Pt continues to work of quad strengthening as well. Pt tolerated PT today without adverse effects. Educated pt on managing her activity level to limit negative occurrences of increased swelling and pain.  EVAL: Patient is a 63 y.o. female who was seen today for physical therapy evaluation and treatment  for Z98.890 (ICD-10-CM) - Status post arthroscopy of right knee with a lateral menisectomy. Pt presents with decreased R knee/LE strength, ROM and function. Pt will benefit from skilled PT 2w8 to address impairments to optimize R knee/LE function with less pain.   OBJECTIVE IMPAIRMENTS: decreased activity tolerance, decreased balance, decreased mobility, difficulty walking, decreased ROM, decreased strength, increased edema, and pain.   ACTIVITY LIMITATIONS: carrying, lifting, bending, sitting, standing, squatting, sleeping, stairs, bathing, toileting, dressing, hygiene/grooming, and locomotion level  PARTICIPATION LIMITATIONS: meal prep, cleaning, laundry, driving, shopping, and community activity  PERSONAL FACTORS: Age, Past/current experiences, and Time since onset of injury/illness/exacerbation are also affecting patient's functional outcome.   REHAB POTENTIAL: Good  CLINICAL DECISION MAKING: Evolving/moderate complexity  EVALUATION COMPLEXITY: Moderate   GOALS:  SHORT TERM GOALS: Target date: 01/15/24 Pt will be Ind in an initial HEP  Baseline: started Goal status: INITIAL  2.  R knee AROM will improve to 5-120d to progress toward improved  function Baseline: 16-110 Goal status: INITIAL  3.  Pt will be able to complete SLRs 10x as demonstration of R LE strength to walk without an assistance of a SPC  Baseline:  Goal status: INITIAL  4.  Pt will progress to walking without a SPC on level surfaces Baseline:  Goal status: INITIAL  LONG TERM GOALS: Target date: 02/23/24  Pt will be Ind in a final HEP to maintain achieved LOF  Baseline:  Goal status: INITIAL  2.  R knee AROM will improve to 0-125d to progress toward improved function with daily activities  Baseline: 16-110d Goal status: INITIAL  3.  R hip and knee strength will demonstrate of 4+ or greater to improve ability to complete daily activities Baseline: see flow sheets Goal status: INITIAL  4.  Improve 5xSTS by MCID of 5 and by MCID of 61ft as indication of improved functional mobility  Baseline: TBA Goal status: INITIAL  5.  Pt's LEFS score will increase to 50% or greater as indication of improved function  Baseline: 25% Goal status: INITIAL  6.  Pt will walk 400' or greater s an assist device and asc/dsc 12 steps c HR assist for community ambulation, and pt will do so with demonstrate a normalized gait pattern  Baseline: antalgic and c SPC Goal status: INITIAL   PLAN:  PT FREQUENCY: 2x/week  PT DURATION: 8 weeks  PLANNED INTERVENTIONS: 97164- PT Re-evaluation, 97110-Therapeutic exercises, 97530- Therapeutic activity, 97112- Neuromuscular re-education, 97535- Self Care, 02859- Manual therapy, [phone]- Gait training, [phone]- Aquatic Therapy, Patient/Family education, Balance training, Stair training, Taping, Joint mobilization, Cryotherapy, and Moist heat  PLAN FOR NEXT SESSION: Assess 5xSTS and when pt is able to walk without a SPC; assess response to HEP; progress therex as indicated; use of modalities, manual therapy; and TPDN as indicated.  Aviendha Azbell PT 01/05/24 1:58 PM

## 2024-01-05 ENCOUNTER — Ambulatory Visit

## 2024-01-05 DIAGNOSIS — M25561 Pain in right knee: Secondary | ICD-10-CM | POA: Diagnosis not present

## 2024-01-05 DIAGNOSIS — M6281 Muscle weakness (generalized): Secondary | ICD-10-CM

## 2024-01-05 DIAGNOSIS — R262 Difficulty in walking, not elsewhere classified: Secondary | ICD-10-CM

## 2024-01-07 ENCOUNTER — Encounter: Payer: Self-pay | Admitting: Orthopaedic Surgery

## 2024-01-07 ENCOUNTER — Ambulatory Visit (INDEPENDENT_AMBULATORY_CARE_PROVIDER_SITE_OTHER): Admitting: Orthopaedic Surgery

## 2024-01-07 DIAGNOSIS — M25562 Pain in left knee: Secondary | ICD-10-CM

## 2024-01-07 DIAGNOSIS — Z9889 Other specified postprocedural states: Secondary | ICD-10-CM

## 2024-01-07 NOTE — Therapy (Signed)
 OUTPATIENT PHYSICAL THERAPY LOWER EXTREMITY TREATMENT   Patient Name: Bailey Patterson MRN: [id number] DOB:1960-09-08, 62 y.o., female Today's Date: 01/08/2024  END OF SESSION:  PT End of Session - 01/08/24 0948     Visit Number 6    Number of Visits 17    Date for PT Re-Evaluation 02/23/24    Authorization Type [employer]    Authorization Time Period [employer] [place] Medicaid Approved 13 visits 12/28/23-03/27/24    Authorization - Visit Number 6    Authorization - Number of Visits 13    PT Start Time 0930    PT Stop Time 1013    PT Time Calculation (min) 43 min    Activity Tolerance Patient tolerated treatment well    Behavior During Therapy [facility] for tasks assessed/performed             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   Paresthesia 12/20/2010   Mild   Phlebitis, complicating pregnancy or puerperium    Presence of IVC filter    Rectal pain, chronic 08/11/2012   4/14    Vertigo    Past Surgical History:  Procedure Laterality Date   CESAREAN SECTION     ENDOMETRIAL ABLATION     HERNIA REPAIR     KNEE ARTHROSCOPY WITH LATERAL MENISECTOMY Right 12/03/2023   Procedure: ARTHROSCOPY, KNEE, WITH PARTIAL LATERAL MENISCECTOMY;  Surgeon: Vernetta Lonni GRADE, MD;  Location: [facility];  Service: Orthopedics;  Laterality: Right;   Patient Active Problem List   Diagnosis Date Noted   Acute lateral meniscus tear of right knee 12/03/2023   Complex tear of lateral meniscus of right knee as current  injury 12/02/2023   COVID-19 11/27/2022   Other fatigue 10/13/2022   Fibromyalgia 08/27/2022   Sleep disorder 05/28/2022   Gastroesophageal reflux disease without esophagitis 04/11/2022   Vitamin D  deficiency 12/27/2021   Postmenopausal bleeding 12/27/2021   Mild intermittent asthma without complication 12/26/2021   History of COVID-19 02/02/2020   Chronic migraine without aura, with intractable migraine, so stated, with status migrainosus 07/16/2017   Other constipation 08/11/2012    PCP: Lucius Krabbe, NP   REFERRING PROVIDER: Vernetta Lonni GRADE, MD   REFERRING DIAG: [phone] (ICD-10-CM) - Status post arthroscopy of right knee   THERAPY DIAG:  Acute pain of right knee  Muscle weakness (generalized)  Difficulty in walking, not elsewhere classified  Rationale for Evaluation and Treatment: Rehabilitation  ONSET DATE: 12/03/23- surgery  SUBJECTIVE:   SUBJECTIVE STATEMENT: Pt reports seeing Dr. Vernetta yesterday. She received an injection for her L knee, due to increased pain there. 15cc of fluid was taken off her R knee. She is also having R heel pain. Tennis shoes increase the heel pain, while thick heeled shoes feel better.   PERTINENT HISTORY: See above  PAIN:  Are you having pain? Yes: NPRS scale: 2/10 Pain location: R knee joint line Pain description: ache Aggravating factors: prolonged positions, bending  Relieving factors: Rest, cold pack, lidocaine  patch  PRECAUTIONS: Pt  states she was told to wear her knee brace for 2 weeks  RED FLAGS: None   WEIGHT BEARING RESTRICTIONS: No  FALLS:  Has patient fallen in last 6 months? No  LIVING ENVIRONMENT: Lives with: lives alone Lives in: House/apartment Stairs: Yes: External: 17 steps; can reach both Has following equipment at home: Single point cane  OCCUPATION: FMLA- CNA in homes  PLOF: Independent  PATIENT GOALS: To walk normal, to wear heels, and to go out drancing   NEXT MD VISIT:  01/07/24  OBJECTIVE:  Note: Objective measures were completed at Evaluation unless otherwise noted.  DIAGNOSTIC FINDINGS:  08/23/23  IMPRESSION: 1. Bucket-handle tear of the lateral meniscus flipped towards the intercondylar notch. 2. Tricompartmental cartilage abnormalities as described above.  PATIENT SURVEYS:  LEFS; 20/80=25%  COGNITION: Overall cognitive status: Within functional limits for tasks assessed     SENSATION: WFL  EDEMA:   Swelling the R knee  MUSCLE LENGTH: Hamstrings: Right WNLs deg; Left WNLs deg  POSTURE: No Significant postural limitations  PALPATION: TTP of the anterior R knee joint spaces  LOWER EXTREMITY ROM:  Active ROM Right eval Left eval Rt 12/25/23 Rt 8/19 RT 01/05/24  Hip flexion       Hip extension       Hip abduction       Hip adduction       Hip internal rotation       Hip external rotation       Knee flexion 110 130 125  128  Knee extension 16 lacking 0 12 lacking 5 lacking 3 lacking  Ankle dorsiflexion       Ankle plantarflexion       Ankle inversion       Ankle eversion        (Blank rows = not tested)  LOWER EXTREMITY MMT:  Pt is not able to complete a R SLR MMT Right eval Left eval  Hip flexion 3 5  Hip extension 4 5  Hip abduction 4 5  Hip adduction    Hip internal rotation    Hip external rotation 4 5  Knee flexion 3- 5  Knee extension 3- 5  Ankle dorsiflexion 5 5  Ankle plantarflexion 5 5  Ankle inversion    Ankle eversion     (Blank rows = not tested)  FUNCTIONAL TESTS:  5 times sit to stand: TBA 2 minute walk test: TBA  GAIT: Distance walked: 200' Assistive device utilized: Single point cane Level of assistance: Modified independence Comments: antalgic gait pattern                                                                                                          TREATMENT DATE:  [facility] Adult PT Treatment:                                                DATE: 01/08/24 Therapeutic  Exercise/Activities: Supine Quad  Set 10 reps - 5'' hold w/ heel prop Active Straight Leg Raise with Quad x10 - 2# S/L hip abd - R - 2x10 - 2# Short Arc Quad x10 reps - 3 hold - 2# Seated hamstring/ankle DF stretch with pillow case 2x 60 Standing gastro strech c towel roll for ankle DF 2x  STS, staggered L leg forward, x10 Wall slides, staggered L leg forward, to 60d x10 Self care: Use of heel lift  [facility] Adult PT Treatment:                                                DATE: 01/05/24 Therapeutic Exercise: nu-step L5 43m while taking subjective and planning session with patient Supine Quad Set 10 reps - 5'' hold w/ heel prop Active Straight Leg Raise with Quad 2x10 - 2# S/L hip abd - R - 2x10 - 2# Supine Heel Slide   1 sets - 10 reps - 5 hold Short Arc Quad x10 reps - 3 hold - 2# Supine Hamstring stretch c strap  Manual Therapy: Grade ll PA mobs femur on tibia Self care: Self care re: managing activity to minimize pain and swelling of the R knee    [facility] Adult PT Treatment:                                                DATE: 12/31/23 Therapeutic Exercise: nu-step L5 1m while taking subjective and planning session with patient Supine Quad Set 10 reps - 5'' hold w/ heel prop Active Straight Leg Raise with Quad 2x10 - 2# S/L hip abd - R - 2x10 - 2# Bridge on ball - 2x10 - 5'' hold Supine Heel Slide   1 sets - 10 reps - 5 hold Short Arc Quad x10 reps - 3 hold - 2# 90 degree quad isometric - 5'' hold - 2x10  [facility] Adult PT Treatment:                                                DATE: 12/22/23 Therapeutic Exercise: Developed, instructed in, and pt completed therex as noted in HEP  Self Care: Gait training c SPC in L hand and heel to toe gait pattern Use of cold pack with toweling as needed c elevation for pain and swelling management To wear knee brace if her knee feels weak like it will give out   PATIENT EDUCATION:  Education details: Eval findings, POC, HEP, self care  Person  educated: Patient Education method: Explanation, Demonstration, Tactile cues, Verbal cues, and Handouts Education comprehension: verbalized understanding, returned demonstration, verbal cues required, and tactile cues required  HOME EXERCISE PROGRAM: Access Code: [email or web address] URL: [email or web address] Date: 01/08/2024 Prepared by: Dasie Daft  Exercises - Supine Quad Set  - 1 x daily - 7 x weekly - 1 sets - 10 reps - 5 hold - Active Straight Leg Raise with Quad Set  - 1 x daily - 7 x weekly - 1 sets - 10 reps - 3 hold - Supine Heel Slide  - 1 x daily - 7 x weekly - 1 sets -  10 reps - 5 hold - Short Arc Quad with Ankle Weight  - 1 x daily - 7 x weekly - 1 sets - 10 reps - 3 hold - Sidelying Hip Abduction  - 1 x daily - 7 x weekly - 1 sets - 10 reps - 3 hold - Hooklying Clamshell with Resistance  - 1 x daily - 7 x weekly - 1 sets - 10 reps - 3 hold - Seated Table Hamstring Stretch (Mirrored)  - 1 x daily - 7 x weekly - 1 sets - 3 reps - 60 hold - Gastroc Stretch on Wall (Mirrored)  - 1 x daily - 7 x weekly - 1 sets - 3 reps - 60 hold - Staggered Sit-to-Stand  - 1 x daily - 7 x weekly - 1 sets - 10 reps ASSESSMENT:  CLINICAL IMPRESSION: Heel lifts were provided for her tennis shoes. Heel pain seems related to tight heel cords. Pt's decreased knee ext AROM also seems related to both the tight heel cord and to tight hamstrings. Strengthening was continued for the R leg. CKC exs with knee flexion less than 90d were started. The L leg was positioned staggered forward due to it hurting more than the R. Pt reported less heel pain using the heel lifts in her tennis shoes. Pt's HEP was updated. Pt is walking s the assist of a SPC and the quality of her gait pattern is improving with an increased step length of the L leg when in R leg stance. Assess 5xSTS and next week.  EVAL: Patient is a 63 y.o. female who was seen today for physical therapy evaluation and treatment for Z98.890  (ICD-10-CM) - Status post arthroscopy of right knee with a lateral menisectomy. Pt presents with decreased R knee/LE strength, ROM and function. Pt will benefit from skilled PT 2w8 to address impairments to optimize R knee/LE function with less pain.   OBJECTIVE IMPAIRMENTS: decreased activity tolerance, decreased balance, decreased mobility, difficulty walking, decreased ROM, decreased strength, increased edema, and pain.   ACTIVITY LIMITATIONS: carrying, lifting, bending, sitting, standing, squatting, sleeping, stairs, bathing, toileting, dressing, hygiene/grooming, and locomotion level  PARTICIPATION LIMITATIONS: meal prep, cleaning, laundry, driving, shopping, and community activity  PERSONAL FACTORS: Age, Past/current experiences, and Time since onset of injury/illness/exacerbation are also affecting patient's functional outcome.   REHAB POTENTIAL: Good  CLINICAL DECISION MAKING: Evolving/moderate complexity  EVALUATION COMPLEXITY: Moderate   GOALS:  SHORT TERM GOALS: Target date: 01/15/24 Pt will be Ind in an initial HEP  Baseline: started Goal status: ONgoing  2.  R knee AROM will improve to 5-120d to progress toward improved function Baseline: 16-110 Goal status: ONGOING  3.  Pt will be able to complete SLRs 10x as demonstration of R LE strength to walk without an assistance of a SPC  Baseline:  Goal status: MET  4.  Pt will progress to walking without a SPC on level surfaces Baseline:  Goal status: MET  LONG TERM GOALS: Target date: 02/23/24  Pt will be Ind in a final HEP to maintain achieved LOF  Baseline:  Goal status: INITIAL  2.  R knee AROM will improve to 0-125d to progress toward improved function with daily activities  Baseline: 16-110d Goal status: INITIAL  3.  R hip and knee strength will demonstrate of 4+ or greater to improve ability to complete daily activities Baseline: see flow sheets Goal status: INITIAL  4.  Improve 5xSTS by MCID of 5 and  by MCID of 45ft  as indication of improved functional mobility  Baseline: TBA Goal status: INITIAL  5.  Pt's LEFS score will increase to 50% or greater as indication of improved function  Baseline: 25% Goal status: INITIAL  6.  Pt will walk 400' or greater s an assist device and asc/dsc 12 steps c HR assist for community ambulation, and pt will do so with demonstrate a normalized gait pattern  Baseline: antalgic and c SPC Goal status: INITIAL   PLAN:  PT FREQUENCY: 2x/week  PT DURATION: 8 weeks  PLANNED INTERVENTIONS: 97164- PT Re-evaluation, 97110-Therapeutic exercises, 97530- Therapeutic activity, 97112- Neuromuscular re-education, 97535- Self Care, [id number]- Manual therapy, [phone]- Gait training, [phone]- Aquatic Therapy, Patient/Family education, Balance training, Stair training, Taping, Joint mobilization, Cryotherapy, and Moist heat  PLAN FOR NEXT SESSION: Assess 5xSTS and when pt is able to walk without a SPC; assess response to HEP; progress therex as indicated; use of modalities, manual therapy; and TPDN as indicated.  Tayron Hunnell PT 01/08/24 3:37 PM

## 2024-01-07 NOTE — Progress Notes (Signed)
 The patient is a 63 year old female who is now 5 weeks status post a right knee arthroscopy with a partial lateral meniscectomy for a significantly large lateral meniscal tear and there was a bucket-handle tear.  She says the knee is doing better with physical therapy but she still has some spasms and some burning pain at times.  There is a little bit of swelling still in the right knee.  She has recently started to have some left knee pain but no known injury.  Examination of her left knee shows there is no effusion but there is pain throughout the arc of motion of the knee but it is ligamentously stable with a negative McMurray's and negative Lachman's exam.  Her right operative knee still shows a mild effusion but good range of motion overall.  I did aspirate 15 cc of fluid from the right knee which she tolerated well.  We also placed a steroid injection in her left knee which was helpful for her.   We will let her return to work duties starting Monday, January 25, 2024.  She will continue therapy in the interim and we will see her back in a month to see how she doing overall.  If she still having left knee issues at that visit we will have a standing AP and lateral of her left knee.

## 2024-01-08 ENCOUNTER — Ambulatory Visit

## 2024-01-08 DIAGNOSIS — M6281 Muscle weakness (generalized): Secondary | ICD-10-CM

## 2024-01-08 DIAGNOSIS — M25561 Pain in right knee: Secondary | ICD-10-CM

## 2024-01-08 DIAGNOSIS — R262 Difficulty in walking, not elsewhere classified: Secondary | ICD-10-CM

## 2024-01-11 NOTE — Therapy (Incomplete)
 OUTPATIENT PHYSICAL THERAPY LOWER EXTREMITY TREATMENT   Patient Name: Bailey Patterson MRN: [id number] DOB:12-24-1960, 63 y.o., female Today's Date: 01/11/2024  END OF SESSION:       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   Paresthesia 12/20/2010   Mild   Phlebitis, complicating pregnancy or puerperium    Presence of IVC filter    Rectal pain, chronic 08/11/2012   4/14    Vertigo    Past Surgical History:  Procedure Laterality Date   CESAREAN SECTION     ENDOMETRIAL ABLATION     HERNIA REPAIR     KNEE ARTHROSCOPY WITH LATERAL MENISECTOMY Right 12/03/2023   Procedure: ARTHROSCOPY, KNEE, WITH PARTIAL LATERAL MENISCECTOMY;  Surgeon: Vernetta Lonni GRADE, MD;  Location: [facility];  Service: Orthopedics;  Laterality: Right;   Patient Active Problem List   Diagnosis Date Noted   Acute lateral meniscus tear of right knee 12/03/2023   Complex tear of lateral meniscus of right knee as current injury 12/02/2023   COVID-19 11/27/2022   Other fatigue 10/13/2022   Fibromyalgia 08/27/2022   Sleep disorder 05/28/2022   Gastroesophageal reflux disease without esophagitis 04/11/2022   Vitamin D  deficiency 12/27/2021   Postmenopausal bleeding 12/27/2021   Mild intermittent asthma without complication 12/26/2021   History of COVID-19 02/02/2020   Chronic migraine without aura, with intractable migraine, so stated, with status migrainosus 07/16/2017   Other constipation 08/11/2012    PCP: Lucius Krabbe,  NP   REFERRING PROVIDER: Vernetta Lonni GRADE, MD   REFERRING DIAG: [phone] (ICD-10-CM) - Status post arthroscopy of right knee   THERAPY DIAG:  No diagnosis found.  Rationale for Evaluation and Treatment: Rehabilitation  ONSET DATE: 12/03/23- surgery  SUBJECTIVE:   SUBJECTIVE STATEMENT: Pt reports seeing Dr. Vernetta yesterday. She received an injection for her L knee, due to increased pain there. 15cc of fluid was taken off her R knee. She is also having R heel pain. Tennis shoes increase the heel pain, while thick heeled shoes feel better.   PERTINENT HISTORY: See above  PAIN:  Are you having pain? Yes: NPRS scale: 2/10 Pain location: R knee joint line Pain description: ache Aggravating factors: prolonged positions, bending  Relieving factors: Rest, cold pack, lidocaine  patch  PRECAUTIONS: Pt states she was told to wear her knee brace for 2 weeks  RED FLAGS: None   WEIGHT BEARING RESTRICTIONS: No  FALLS:  Has patient fallen in last 6 months? No  LIVING ENVIRONMENT: Lives with: lives alone Lives in: House/apartment Stairs: Yes: External: 17 steps; can reach both Has following equipment at home: Single point cane  OCCUPATION: FMLA- CNA in homes  PLOF: Independent  PATIENT GOALS: To walk normal, to wear heels, and to go out drancing   NEXT MD VISIT: 01/07/24  OBJECTIVE:  Note: Objective measures were completed at Evaluation unless otherwise noted.  DIAGNOSTIC FINDINGS:  08/23/23  IMPRESSION: 1. Bucket-handle tear of the lateral meniscus flipped towards the intercondylar notch. 2. Tricompartmental cartilage abnormalities as described above.  PATIENT SURVEYS:  LEFS; 20/80=25%  COGNITION: Overall cognitive status: Within functional limits for tasks assessed     SENSATION: WFL  EDEMA:   Swelling the R knee  MUSCLE LENGTH: Hamstrings: Right WNLs deg; Left WNLs deg  POSTURE: No Significant postural limitations  PALPATION: TTP of the anterior  R knee joint spaces  LOWER EXTREMITY ROM:  Active ROM Right eval Left eval Rt 12/25/23 Rt 8/19 RT 01/05/24  Hip flexion       Hip extension       Hip abduction       Hip adduction       Hip internal rotation       Hip external rotation       Knee flexion 110 130 125  128  Knee extension 16 lacking 0 12 lacking 5 lacking 3 lacking  Ankle dorsiflexion       Ankle plantarflexion       Ankle inversion       Ankle eversion        (Blank rows = not tested)  LOWER EXTREMITY MMT:  Pt is not able to complete a R SLR MMT Right eval Left eval  Hip flexion 3 5  Hip extension 4 5  Hip abduction 4 5  Hip adduction    Hip internal rotation    Hip external rotation 4 5  Knee flexion 3- 5  Knee extension 3- 5  Ankle dorsiflexion 5 5  Ankle plantarflexion 5 5  Ankle inversion    Ankle eversion     (Blank rows = not tested)  FUNCTIONAL TESTS:  5 times sit to stand: TBA 2 minute walk test: TBA  GAIT: Distance walked: 200' Assistive device utilized: Single point cane Level of assistance: Modified independence Comments: antalgic gait pattern                                                                                                          TREATMENT DATE:  [facility] Adult PT Treatment:                                                DATE: 01/12/24 Therapeutic Exercise/Activities: Supine Quad Set 10 reps - 5'' hold w/ heel prop Active Straight Leg Raise with Quad x10 - 2# S/L hip abd - R - 2x10 - 2# Short Arc Quad x10 reps - 3 hold - 2# Seated hamstring/ankle DF stretch with pillow case 2x 60 Standing gastro strech c towel roll for ankle DF 2x  STS, staggered L leg forward, x10 Wall slides, staggered L leg forward, to 60d x10 Self care: Use of heel lift Therapeutic Exercise: *** Manual Therapy: *** Neuromuscular re-ed: *** Therapeutic Activity: *** Modalities: *** Self Care: ***  [name] Adult PT Treatment:  DATE:  01/08/24 Therapeutic Exercise/Activities: Supine Quad Set 10 reps - 5'' hold w/ heel prop Active Straight Leg Raise with Quad x10 - 2# S/L hip abd - R - 2x10 - 2# Short Arc Quad x10 reps - 3 hold - 2# Seated hamstring/ankle DF stretch with pillow case 2x 60 Standing gastro strech c towel roll for ankle DF 2x  STS, staggered L leg forward, x10 Wall slides, staggered L leg forward, to 60d x10 Self care: Use of heel lift  [facility] Adult PT Treatment:                                                DATE: 01/05/24 Therapeutic Exercise: nu-step L5 93m while taking subjective and planning session with patient Supine Quad Set 10 reps - 5'' hold w/ heel prop Active Straight Leg Raise with Quad 2x10 - 2# S/L hip abd - R - 2x10 - 2# Supine Heel Slide   1 sets - 10 reps - 5 hold Short Arc Quad x10 reps - 3 hold - 2# Supine Hamstring stretch c strap  Manual Therapy: Grade ll PA mobs femur on tibia Self care: Self care re: managing activity to minimize pain and swelling of the R knee    [facility] Adult PT Treatment:                                                DATE: 12/31/23 Therapeutic Exercise: nu-step L5 77m while taking subjective and planning session with patient Supine Quad Set 10 reps - 5'' hold w/ heel prop Active Straight Leg Raise with Quad 2x10 - 2# S/L hip abd - R - 2x10 - 2# Bridge on ball - 2x10 - 5'' hold Supine Heel Slide   1 sets - 10 reps - 5 hold Short Arc Quad x10 reps - 3 hold - 2# 90 degree quad isometric - 5'' hold - 2x10   PATIENT EDUCATION:  Education details: Eval findings, POC, HEP, self care  Person educated: Patient Education method: Explanation, Demonstration, Tactile cues, Verbal cues, and Handouts Education comprehension: verbalized understanding, returned demonstration, verbal cues required, and tactile cues required  HOME EXERCISE PROGRAM: Access Code: [email or web address] URL: [email or web address] Date: 01/08/2024 Prepared by: Dasie Daft  Exercises -  Supine Quad Set  - 1 x daily - 7 x weekly - 1 sets - 10 reps - 5 hold - Active Straight Leg Raise with Quad Set  - 1 x daily - 7 x weekly - 1 sets - 10 reps - 3 hold - Supine Heel Slide  - 1 x daily - 7 x weekly - 1 sets - 10 reps - 5 hold - Short Arc Quad with Ankle Weight  - 1 x daily - 7 x weekly - 1 sets - 10 reps - 3 hold - Sidelying Hip Abduction  - 1 x daily - 7 x weekly - 1 sets - 10 reps - 3 hold - Hooklying Clamshell with Resistance  - 1 x daily - 7 x weekly - 1 sets - 10 reps - 3 hold - Seated Table Hamstring Stretch (Mirrored)  - 1 x daily - 7 x weekly - 1 sets - 3 reps - 60 hold - Gastroc Stretch on Wall (  Mirrored)  - 1 x daily - 7 x weekly - 1 sets - 3 reps - 60 hold - Staggered Sit-to-Stand  - 1 x daily - 7 x weekly - 1 sets - 10 reps ASSESSMENT:  CLINICAL IMPRESSION: Heel lifts were provided for her tennis shoes. Heel pain seems related to tight heel cords. Pt's decreased knee ext AROM also seems related to both the tight heel cord and to tight hamstrings. Strengthening was continued for the R leg. CKC exs with knee flexion less than 90d were started. The L leg was positioned staggered forward due to it hurting more than the R. Pt reported less heel pain using the heel lifts in her tennis shoes. Pt's HEP was updated. Pt is walking s the assist of a SPC and the quality of her gait pattern is improving with an increased step length of the L leg when in R leg stance. Assess 5xSTS and next week.  EVAL: Patient is a 63 y.o. female who was seen today for physical therapy evaluation and treatment for Z98.890 (ICD-10-CM) - Status post arthroscopy of right knee with a lateral menisectomy. Pt presents with decreased R knee/LE strength, ROM and function. Pt will benefit from skilled PT 2w8 to address impairments to optimize R knee/LE function with less pain.   OBJECTIVE IMPAIRMENTS: decreased activity tolerance, decreased balance, decreased mobility, difficulty walking, decreased ROM,  decreased strength, increased edema, and pain.   ACTIVITY LIMITATIONS: carrying, lifting, bending, sitting, standing, squatting, sleeping, stairs, bathing, toileting, dressing, hygiene/grooming, and locomotion level  PARTICIPATION LIMITATIONS: meal prep, cleaning, laundry, driving, shopping, and community activity  PERSONAL FACTORS: Age, Past/current experiences, and Time since onset of injury/illness/exacerbation are also affecting patient's functional outcome.   REHAB POTENTIAL: Good  CLINICAL DECISION MAKING: Evolving/moderate complexity  EVALUATION COMPLEXITY: Moderate   GOALS:  SHORT TERM GOALS: Target date: 01/15/24 Pt will be Ind in an initial HEP  Baseline: started Goal status: ONgoing  2.  R knee AROM will improve to 5-120d to progress toward improved function Baseline: 16-110 Goal status: ONGOING  3.  Pt will be able to complete SLRs 10x as demonstration of R LE strength to walk without an assistance of a SPC  Baseline:  Goal status: MET  4.  Pt will progress to walking without a SPC on level surfaces Baseline:  Goal status: MET  LONG TERM GOALS: Target date: 02/23/24  Pt will be Ind in a final HEP to maintain achieved LOF  Baseline:  Goal status: INITIAL  2.  R knee AROM will improve to 0-125d to progress toward improved function with daily activities  Baseline: 16-110d Goal status: INITIAL  3.  R hip and knee strength will demonstrate of 4+ or greater to improve ability to complete daily activities Baseline: see flow sheets Goal status: INITIAL  4.  Improve 5xSTS by MCID of 5 and by MCID of 22ft as indication of improved functional mobility  Baseline: TBA Goal status: INITIAL  5.  Pt's LEFS score will increase to 50% or greater as indication of improved function  Baseline: 25% Goal status: INITIAL  6.  Pt will walk 400' or greater s an assist device and asc/dsc 12 steps c HR assist for community ambulation, and pt will do so with demonstrate a  normalized gait pattern  Baseline: antalgic and c SPC Goal status: INITIAL   PLAN:  PT FREQUENCY: 2x/week  PT DURATION: 8 weeks  PLANNED INTERVENTIONS: 97164- PT Re-evaluation, 97110-Therapeutic exercises, 97530- Therapeutic activity, 97112- Neuromuscular re-education,  02464- Self Care, 02859- Manual therapy, [phone]- Gait training, [phone]- Aquatic Therapy, Patient/Family education, Balance training, Stair training, Taping, Joint mobilization, Cryotherapy, and Moist heat  PLAN FOR NEXT SESSION: Assess 5xSTS and when pt is able to walk without a SPC; assess response to HEP; progress therex as indicated; use of modalities, manual therapy; and TPDN as indicated.  Ac Colan PT 01/11/24 9:44 PM

## 2024-01-12 ENCOUNTER — Ambulatory Visit: Attending: Orthopaedic Surgery

## 2024-01-12 ENCOUNTER — Telehealth: Payer: Self-pay

## 2024-01-12 DIAGNOSIS — R262 Difficulty in walking, not elsewhere classified: Secondary | ICD-10-CM | POA: Insufficient documentation

## 2024-01-12 DIAGNOSIS — M25561 Pain in right knee: Secondary | ICD-10-CM | POA: Insufficient documentation

## 2024-01-12 DIAGNOSIS — M6281 Muscle weakness (generalized): Secondary | ICD-10-CM | POA: Insufficient documentation

## 2024-01-12 NOTE — Telephone Encounter (Signed)
 Attempted contacting by phone re: 1st no show appt today. Was unable to leave a message.

## 2024-01-14 ENCOUNTER — Ambulatory Visit

## 2024-01-14 DIAGNOSIS — R262 Difficulty in walking, not elsewhere classified: Secondary | ICD-10-CM | POA: Diagnosis present

## 2024-01-14 DIAGNOSIS — M25561 Pain in right knee: Secondary | ICD-10-CM | POA: Diagnosis present

## 2024-01-14 DIAGNOSIS — M6281 Muscle weakness (generalized): Secondary | ICD-10-CM

## 2024-01-14 NOTE — Therapy (Signed)
 OUTPATIENT PHYSICAL THERAPY LOWER EXTREMITY TREATMENT   Patient Name: Bailey Patterson MRN: [id number] DOB:[birth year], 63 y.o., female Today's Date: 01/14/2024  END OF SESSION:  PT End of Session - 01/14/24 0956     Visit Number 7    Number of Visits 17    Date for PT Re-Evaluation 02/23/24    Authorization Type [employer]    Authorization Time Period [place] Medicaid Approved 13 visits 12/28/23-03/27/24    Authorization - Visit Number 7    Authorization - Number of Visits 13    PT Start Time 0930    PT Stop Time 1023    PT Time Calculation (min) 53 min    Activity Tolerance Patient tolerated treatment well    Behavior During Therapy [employer] for tasks assessed/performed              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   Paresthesia 12/20/2010   Mild   Phlebitis, complicating pregnancy or puerperium    Presence of IVC filter    Rectal pain, chronic 08/11/2012   4/14    Vertigo    Past Surgical History:  Procedure Laterality Date   CESAREAN SECTION     ENDOMETRIAL ABLATION     HERNIA REPAIR     KNEE ARTHROSCOPY WITH LATERAL MENISECTOMY Right 12/03/2023   Procedure: ARTHROSCOPY, KNEE, WITH PARTIAL LATERAL MENISCECTOMY;  Surgeon: Vernetta Lonni GRADE, MD;  Location: [facility];  Service: Orthopedics;  Laterality: Right;   Patient Active Problem List   Diagnosis Date Noted   Acute lateral meniscus tear of right knee 12/03/2023   Complex tear of lateral meniscus of right knee as current  injury 12/02/2023   COVID-19 11/27/2022   Other fatigue 10/13/2022   Fibromyalgia 08/27/2022   Sleep disorder 05/28/2022   Gastroesophageal reflux disease without esophagitis 04/11/2022   Vitamin D  deficiency 12/27/2021   Postmenopausal bleeding 12/27/2021   Mild intermittent asthma without complication 12/26/2021   History of COVID-19 02/02/2020   Chronic migraine without aura, with intractable migraine, so stated, with status migrainosus 07/16/2017   Other constipation 08/11/2012    PCP: Lucius Krabbe, NP   REFERRING PROVIDER: Vernetta Lonni GRADE, MD   REFERRING DIAG: [phone] (ICD-10-CM) - Status post arthroscopy of right knee   THERAPY DIAG:  Acute pain of right knee  Muscle weakness (generalized)  Difficulty in walking, not elsewhere classified  Rationale for Evaluation and Treatment: Rehabilitation  ONSET DATE: 12/03/23- surgery  SUBJECTIVE:   SUBJECTIVE STATEMENT: Pt reports she feels like she is improving. The pain and swelling is a little greater with her mom being in the hospital and needing to do a lot of walking to visit her.  PERTINENT HISTORY: See above  PAIN:  Are you having pain? Yes: NPRS scale: 2/10 Pain location: R knee joint line Pain description: ache Aggravating factors: prolonged positions, bending  Relieving factors: Rest, cold pack, lidocaine  patch  PRECAUTIONS: Pt states she was told to wear her knee brace for 2 weeks  RED FLAGS: None   WEIGHT BEARING RESTRICTIONS: No  FALLS:  Has patient fallen in last 6 months? No  LIVING ENVIRONMENT: Lives with: lives alone Lives in: House/apartment Stairs: Yes: External: 17 steps; can reach both Has following equipment at home: Single point cane  OCCUPATION: FMLA- CNA in homes  PLOF: Independent  PATIENT GOALS: To walk normal, to wear heels, and to go out drancing   NEXT MD VISIT: 01/07/24  OBJECTIVE:  Note: Objective measures were completed at Evaluation unless otherwise  noted.  DIAGNOSTIC FINDINGS:  08/23/23  IMPRESSION: 1. Bucket-handle tear of the lateral meniscus flipped towards the intercondylar notch. 2. Tricompartmental cartilage abnormalities as described above.  PATIENT SURVEYS:  LEFS; 20/80=25%  COGNITION: Overall cognitive status: Within functional limits for tasks assessed     SENSATION: WFL  EDEMA:   Swelling the R knee  MUSCLE LENGTH: Hamstrings: Right WNLs deg; Left WNLs deg  POSTURE: No Significant postural limitations  PALPATION: TTP of the anterior R knee joint spaces  LOWER EXTREMITY ROM:  Active ROM Right eval Left eval Rt 12/25/23 Rt 8/19 RT 01/05/24 RT 01/14/24  Hip flexion        Hip extension        Hip abduction        Hip adduction        Hip internal rotation        Hip external rotation        Knee flexion 110 130 125  128 128  Knee extension 16 lacking 0 12 lacking 5 lacking 3 lacking 5 lacking  Ankle dorsiflexion        Ankle plantarflexion        Ankle inversion        Ankle eversion         (Blank rows = not tested)  LOWER EXTREMITY MMT:  Pt is not able to complete a R SLR MMT Right eval Left eval  Hip flexion 3 5  Hip extension 4 5  Hip abduction 4 5  Hip adduction    Hip internal rotation    Hip external rotation 4 5  Knee flexion 3- 5  Knee extension 3- 5  Ankle dorsiflexion 5 5  Ankle plantarflexion 5 5  Ankle inversion    Ankle eversion     (Blank rows = not tested)  FUNCTIONAL TESTS:  5 times sit to stand: TBA 2 minute walk test: TBA  GAIT: Distance walked: 200' Assistive device utilized: Single point cane Level of assistance: Modified independence Comments: antalgic gait pattern                                                                                                          TREATMENT DATE:  [facility] Adult PT Treatment:                                                DATE: 01/14/24 Therapeutic Activities: Nu-step  L5 60m while taking subjective and planning session  with patient Supine Quad Set 10 reps - 5'' hold w/ heel prop Active Straight Leg Raise with Quad x10 - 2# STS 2x10 barimat table Standing TKE 2x15 BluTB Wall slides 2x10 SL standing Modalities: Cold pack to the R knee c elevation x 10 min  [facility] Adult PT Treatment:                                                DATE: 01/08/24 Therapeutic Exercise/Activities: Supine Quad Set 10 reps - 5'' hold w/ heel prop Active Straight Leg Raise with Quad x10 - 2# S/L hip abd - R - 2x10 - 2# Short Arc Quad x10 reps - 3 hold - 2# Seated hamstring/ankle DF stretch with pillow case 2x 60 Standing gastro strech c towel roll for ankle DF 2x  STS, staggered L leg forward, x10 Wall slides, staggered L leg forward, to 60d x10 Self care: Use of heel lift  [facility] Adult PT Treatment:                                                DATE: 01/05/24 Therapeutic Exercise: nu-step L5 82m while taking subjective and planning session with patient Supine Quad Set 10 reps - 5'' hold w/ heel prop Active Straight Leg Raise with Quad 2x10 - 2# S/L hip abd - R - 2x10 - 2# Supine Heel Slide   1 sets - 10 reps - 5 hold Short Arc Quad x10 reps - 3 hold - 2# Supine Hamstring stretch c strap  Manual Therapy: Grade ll PA mobs femur on tibia Self care: Self care re: managing activity to minimize pain and swelling of the R knee     PATIENT EDUCATION:  Education details: Eval findings, POC, HEP, self care  Person educated: Patient Education method: Explanation, Demonstration, Tactile cues, Verbal cues, and Handouts Education comprehension: verbalized understanding, returned demonstration, verbal cues required, and tactile cues required  HOME EXERCISE PROGRAM: Access Code: [email or web address] URL: [email or web address] Date: 01/08/2024 Prepared by: Dasie Daft  Exercises - Supine Quad Set  - 1 x daily - 7 x weekly - 1 sets - 10 reps - 5 hold - Active Straight Leg Raise with Quad Set  - 1 x daily - 7 x weekly - 1 sets  - 10 reps - 3 hold - Supine Heel Slide  - 1 x daily - 7 x weekly - 1 sets - 10 reps - 5 hold - Short Arc Quad with Ankle Weight  - 1 x daily - 7 x weekly - 1 sets - 10 reps - 3 hold - Sidelying Hip Abduction  - 1 x daily - 7 x weekly - 1 sets - 10 reps - 3 hold - Hooklying Clamshell with Resistance  - 1 x daily - 7 x weekly - 1 sets - 10 reps - 3 hold - Seated Table Hamstring Stretch (Mirrored)  - 1 x daily - 7 x weekly - 1 sets - 3 reps - 60 hold - Gastroc Stretch on Wall (Mirrored)  - 1 x daily - 7 x weekly - 1 sets - 3 reps - 60 hold - Staggered Sit-to-Stand  - 1 x daily - 7  x weekly - 1 sets - 10 reps ASSESSMENT:  CLINICAL IMPRESSION:  R knee flexion when advancing the R LE is decreased associated with more pain and swelling related to increased walking with her mother in the hospital. Knee ext is limited by 5d lacking. PT was focused on improving knee ext ROM and quad strengthening. Overall, pt's function is improving. Pt tolerated the prescribed PT today without adverse effects. Assess 5xSTS and next week.  EVAL: Patient is a 63 y.o. female who was seen today for physical therapy evaluation and treatment for Z98.890 (ICD-10-CM) - Status post arthroscopy of right knee with a lateral menisectomy. Pt presents with decreased R knee/LE strength, ROM and function. Pt will benefit from skilled PT 2w8 to address impairments to optimize R knee/LE function with less pain.   OBJECTIVE IMPAIRMENTS: decreased activity tolerance, decreased balance, decreased mobility, difficulty walking, decreased ROM, decreased strength, increased edema, and pain.   ACTIVITY LIMITATIONS: carrying, lifting, bending, sitting, standing, squatting, sleeping, stairs, bathing, toileting, dressing, hygiene/grooming, and locomotion level  PARTICIPATION LIMITATIONS: meal prep, cleaning, laundry, driving, shopping, and community activity  PERSONAL FACTORS: Age, Past/current experiences, and Time since onset of  injury/illness/exacerbation are also affecting patient's functional outcome.   REHAB POTENTIAL: Good  CLINICAL DECISION MAKING: Evolving/moderate complexity  EVALUATION COMPLEXITY: Moderate   GOALS:  SHORT TERM GOALS: Target date: 01/15/24 Pt will be Ind in an initial HEP  Baseline: started Goal status: MET  2.  R knee AROM will improve to 5-120d to progress toward improved function Baseline: 16-110 01/14/24: 5-128  Goal status: MET  3.  Pt will be able to complete SLRs 10x as demonstration of R LE strength to walk without an assistance of a SPC  Baseline:  Goal status: MET  4.  Pt will progress to walking without a SPC on level surfaces Baseline:  Goal status: MET  LONG TERM GOALS: Target date: 02/23/24  Pt will be Ind in a final HEP to maintain achieved LOF  Baseline:  Goal status: INITIAL  2.  R knee AROM will improve to 0-125d to progress toward improved function with daily activities  Baseline: 16-110d Goal status: INITIAL  3.  R hip and knee strength will demonstrate of 4+ or greater to improve ability to complete daily activities Baseline: see flow sheets Goal status: INITIAL  4.  Improve 5xSTS by MCID of 5 and by MCID of 25ft as indication of improved functional mobility  Baseline: TBA Goal status: INITIAL  5.  Pt's LEFS score will increase to 50% or greater as indication of improved function  Baseline: 25% Goal status: INITIAL  6.  Pt will walk 400' or greater s an assist device and asc/dsc 12 steps c HR assist for community ambulation, and pt will do so with demonstrate a normalized gait pattern  Baseline: antalgic and c SPC Goal status: INITIAL   PLAN:  PT FREQUENCY: 2x/week  PT DURATION: 8 weeks  PLANNED INTERVENTIONS: 97164- PT Re-evaluation, 97110-Therapeutic exercises, 97530- Therapeutic activity, 97112- Neuromuscular re-education, 97535- Self Care, 02859- Manual therapy, [phone]- Gait training, [phone]- Aquatic Therapy, Patient/Family  education, Balance training, Stair training, Taping, Joint mobilization, Cryotherapy, and Moist heat  PLAN FOR NEXT SESSION: Assess 5xSTS and when pt is able to walk without a SPC; assess response to HEP; progress therex as indicated; use of modalities, manual therapy; and TPDN as indicated.  Alfa Leibensperger PT 01/14/24 12:59 PM

## 2024-01-15 ENCOUNTER — Encounter: Payer: Self-pay | Admitting: Family

## 2024-01-15 ENCOUNTER — Ambulatory Visit (INDEPENDENT_AMBULATORY_CARE_PROVIDER_SITE_OTHER): Admitting: Family

## 2024-01-15 VITALS — BP 123/80 | HR 79 | Temp 97.7°F | Ht 62.0 in | Wt 131.6 lb

## 2024-01-15 DIAGNOSIS — R5382 Chronic fatigue, unspecified: Secondary | ICD-10-CM

## 2024-01-15 DIAGNOSIS — R591 Generalized enlarged lymph nodes: Secondary | ICD-10-CM

## 2024-01-15 NOTE — Progress Notes (Signed)
 Patient ID: Bailey Patterson, female    DOB: 12-15-1960, 63 y.o.   MRN: [id number]  Chief Complaint  Patient presents with   Jaw Pain    Pt c/o jaw pain, Present for 1 year.    Fatigue  Discussed the use of AI scribe software for clinical note transcription with the patient, who gave verbal consent to proceed.  History of Present Illness   Bailey Patterson is a 63 year old female who presents with a persistent jaw lump and post-surgical knee pain.  Enlarged cheek lymph node - Persistent left cheek lump present for over one year - Described as a knot that has increased in size over the past six months - Tenderness and discomfort with chewing and biting, resulting in eating on opposite side - ENT evaluation in past (now retired) suggested lymph node enlargement; surgical removal offered but declined due to concern for risks of facial paralysis. - No improvement with antibiotics - No change with use of hot compresses  Postoperative right knee pain and effusion - Underwent arthroscopic knee surgery for torn meniscus on August 24th - Three incisions made during procedure - Postoperative course complicated by significant soreness and fluid accumulation - Aspiration of 40 cc and 15 cc of fluid on two separate occasions - Currently participating in physical therapy with four sessions remaining - Has started wearing low heels as part of recovery  Fatigue and sleep disturbance - Persistent fatigue - Poor sleep quality  Dietary preferences and intolerances - Prefers almond milk due to lactose intolerance - Occasionally consumes sweets and processed foods     Assessment & Plan:     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 ENT evaluation suggested lymph node origin; antibiotics ineffective. Differential includes parotid gland involvement. Moderate swelling noted around approx. 2cm diameter firm nodule.  No erythema or warmth noted.  - Refer to ENT surgeon for further evaluation and potential removal of mass.  Postoperative state following right knee arthroscopy for meniscal tear Post-arthroscopy fluid accumulation required aspiration. Currently in physical therapy with improvement noted. - Continue physical therapy sessions as planned.  Chronic fatigue Chronic fatigue persists. Vitamin D  deficiency likely due to cessation of supplementation. Iron supplementation caused constipation. - Decrease afternoon caffeine intake, to help with better sleep. - Restart Vitamin D3 supplementation at 4000-5000 IU daily. - Consider B complex vitamins for energy support. - Encourage hydration and dietary fiber to manage constipation.     Subjective:    Outpatient Medications Prior to Visit  Medication Sig Dispense Refill   ibuprofen  (ADVIL ) 800 MG tablet Take 1 tablet (800 mg total) by mouth every 8 (eight) hours as needed. 60 tablet 3   HYDROcodone -acetaminophen  (NORCO/VICODIN) 5-325 MG tablet Take 1-2 tablets by mouth every 6 (six) hours as needed for moderate pain (pain score 4-6). 30 tablet 0   methocarbamol  (ROBAXIN ) 500 MG tablet Take 1 tablet (500 mg total) by mouth 4 (four) times daily. 30 tablet 0   naproxen  (NAPROSYN ) 500 MG tablet Take 1 tablet (500 mg total) by mouth 2 (two) times daily as needed for moderate pain (pain score 4-6) or headache (For migraine or other body pain.). Take with 1 Imitrex  (sumatriptan ) for migraine or muscle spasm pain. 60 tablet 2   omeprazole  (PRILOSEC) 20 MG capsule TAKE 1 CAPSULE BY MOUTH UP TO TWICE A DAY FOR 1 WEEK, THEN REDUCE TO DAILY. 90 capsule 1   ondansetron  (ZOFRAN -ODT) 4 MG  disintegrating tablet Take 1 tablet (4 mg total) by mouth every 8 (eight) hours as needed for nausea or vomiting. 20 tablet 0   SUMAtriptan  (IMITREX ) 50 MG tablet Take 1 tablet (50 mg total) by mouth every 2 (two) hours as needed for migraine (Max dose of 4 pills in 24 hours). Take first  dose with 1 Naproxen . 20 tablet 2   No facility-administered medications prior to visit.   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   Paresthesia 12/20/2010   Mild   Phlebitis, complicating pregnancy or puerperium    Presence of IVC filter    Rectal pain, chronic 08/11/2012   4/14    Vertigo    Past Surgical History:  Procedure Laterality Date   CESAREAN SECTION     ENDOMETRIAL ABLATION     HERNIA REPAIR     KNEE ARTHROSCOPY WITH LATERAL MENISECTOMY Right 12/03/2023   Procedure: ARTHROSCOPY, KNEE, WITH PARTIAL LATERAL MENISCECTOMY;  Surgeon: Vernetta Lonni GRADE, MD;  Location: [facility];  Service: Orthopedics;  Laterality: Right;   Allergies  Allergen Reactions   Blueberry [Vaccinium Angustifolium] Anaphylaxis   Grapeseed Extract [Nutritional Supplements] Anaphylaxis    Reaction to muscadines    Pineapple Anaphylaxis   Lyrica [Pregabalin] Swelling    Body and throat swell   Oxycodone  Itching and Nausea And Vomiting   Topiramate     REACTION: confusion-pt thinks 100mg  made her confused   Hydrocodone -Acetaminophen  Rash and Hives      Objective:    Physical Exam Vitals and nursing note reviewed.  Constitutional:      Appearance: Normal appearance.  Cardiovascular:     Rate and Rhythm: Normal rate and regular rhythm.  Pulmonary:     Effort: Pulmonary effort is normal.     Breath sounds: Normal breath sounds.  Musculoskeletal:        General: Normal range of motion.     Right knee: Effusion  (mild) present. Normal range of motion.  Lymphadenopathy:     Comments: Moderate swelling noted around approx. 2cm diameter firm nodule below left buccal bone. No erythema or warmth noted.  Skin:    General: Skin is warm and dry.  Neurological:     Mental Status: She is alert.  Psychiatric:        Mood and Affect: Mood normal.        Behavior: Behavior normal.    BP 123/80 (BP Location: Left Arm, Patient Position: Sitting, Cuff Size: Normal)   Pulse 79   Temp 97.7 F (36.5 C) (Temporal)   Ht 5' 2 (1.575 m)   Wt 131 lb 9.6 oz (59.7 kg)   SpO2 99%   BMI 24.07 kg/m  Wt Readings from Last 3 Encounters:  01/15/24 131 lb 9.6 oz (59.7 kg)  12/03/23 133 lb 13.1 oz (60.7 kg)  10/23/23 129 lb (58.5 kg)       Lucius Krabbe, NP

## 2024-01-17 ENCOUNTER — Encounter: Payer: Self-pay | Admitting: Family

## 2024-01-17 NOTE — Assessment & Plan Note (Signed)
 Chronic fatigue persists. Vitamin D  deficiency likely due to cessation of supplementation. Iron supplementation caused constipation. - Decrease afternoon caffeine intake, to help with better sleep. - Restart Vitamin D3 supplementation at 4000-5000 IU daily. - Consider B complex vitamins for energy support. - Encourage hydration and dietary fiber to manage constipation.

## 2024-01-21 ENCOUNTER — Encounter: Payer: Self-pay | Admitting: Physical Therapy

## 2024-01-21 ENCOUNTER — Ambulatory Visit: Admitting: Physical Therapy

## 2024-01-21 DIAGNOSIS — M6281 Muscle weakness (generalized): Secondary | ICD-10-CM

## 2024-01-21 DIAGNOSIS — M25561 Pain in right knee: Secondary | ICD-10-CM | POA: Diagnosis not present

## 2024-01-21 DIAGNOSIS — R262 Difficulty in walking, not elsewhere classified: Secondary | ICD-10-CM

## 2024-01-21 NOTE — Therapy (Addendum)
 OUTPATIENT PHYSICAL THERAPY LOWER EXTREMITY TREATMENT/DIscharge   Patient Name: Bailey Patterson MRN: [id number] DOB:[birth year], 63 y.o., female Today's Date: 01/21/2024  END OF SESSION:  PT End of Session - 01/21/24 0935     Visit Number 8    Number of Visits 17    Date for PT Re-Evaluation 02/23/24    Authorization Type [employer]    Authorization Time Period [employer] [place] Medicaid Approved 13 visits 12/28/23-03/27/24    Authorization - Visit Number 8    Authorization - Number of Visits 13    PT Start Time 0930    PT Stop Time 1012    PT Time Calculation (min) 42 min    Activity Tolerance Patient tolerated treatment well    Behavior During Therapy 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   Paresthesia 12/20/2010   Mild   Phlebitis, complicating pregnancy or puerperium    Presence of IVC filter    Rectal pain, chronic 08/11/2012   4/14    Vertigo    Past Surgical History:  Procedure Laterality Date   CESAREAN SECTION     ENDOMETRIAL ABLATION     HERNIA REPAIR     KNEE ARTHROSCOPY WITH LATERAL MENISECTOMY Right 12/03/2023   Procedure: ARTHROSCOPY, KNEE, WITH PARTIAL LATERAL MENISCECTOMY;  Surgeon: Vernetta Lonni GRADE, MD;  Location: [facility];  Service: Orthopedics;  Laterality: Right;   Patient Active Problem List   Diagnosis Date Noted   Acute lateral meniscus tear of right knee 12/03/2023   Complex tear of lateral  meniscus of right knee as current injury 12/02/2023   Other fatigue 10/13/2022   Fibromyalgia 08/27/2022   Sleep disorder 05/28/2022   Gastroesophageal reflux disease without esophagitis 04/11/2022   Vitamin D  deficiency 12/27/2021   Postmenopausal bleeding 12/27/2021   Mild intermittent asthma without complication 12/26/2021   History of COVID-19 02/02/2020   Chronic migraine without aura, with intractable migraine, so stated, with status migrainosus 07/16/2017   Other constipation 08/11/2012    PCP: Lucius Krabbe, NP   REFERRING PROVIDER: Vernetta Lonni GRADE, MD   REFERRING DIAG: [phone] (ICD-10-CM) - Status post arthroscopy of right knee   THERAPY DIAG:  Acute pain of right knee  Muscle weakness (generalized)  Difficulty in walking, not elsewhere classified  Rationale for Evaluation and Treatment: Rehabilitation  ONSET DATE: 12/03/23- surgery  SUBJECTIVE:   SUBJECTIVE STATEMENT: Pt reports that she is improving but is still limited in walking for longer periods.  PERTINENT HISTORY: See above  PAIN:  Are you having pain? Yes: NPRS scale: 2/10 Pain location: R knee joint line Pain description: ache Aggravating factors: prolonged positions, bending  Relieving factors: Rest, cold pack, lidocaine  patch  PRECAUTIONS: Pt states she was told to wear her knee brace for 2 weeks  RED FLAGS: None   WEIGHT BEARING RESTRICTIONS: No  FALLS:  Has patient fallen in last 6 months?  No  LIVING ENVIRONMENT: Lives with: lives alone Lives in: House/apartment Stairs: Yes: External: 17 steps; can reach both Has following equipment at home: Single point cane  OCCUPATION: FMLA- CNA in homes  PLOF: Independent  PATIENT GOALS: To walk normal, to wear heels, and to go out drancing   NEXT MD VISIT: 01/07/24  OBJECTIVE:  Note: Objective measures were completed at Evaluation unless otherwise noted.  DIAGNOSTIC FINDINGS:  08/23/23  IMPRESSION: 1. Bucket-handle tear  of the lateral meniscus flipped towards the intercondylar notch. 2. Tricompartmental cartilage abnormalities as described above.  PATIENT SURVEYS:  LEFS; 20/80=25%  COGNITION: Overall cognitive status: Within functional limits for tasks assessed     SENSATION: WFL  EDEMA:   Swelling the R knee  MUSCLE LENGTH: Hamstrings: Right WNLs deg; Left WNLs deg  POSTURE: No Significant postural limitations  PALPATION: TTP of the anterior R knee joint spaces  LOWER EXTREMITY ROM:  Active ROM Right eval Left eval Rt 12/25/23 Rt 8/19 RT 01/05/24 RT 01/14/24  Hip flexion        Hip extension        Hip abduction        Hip adduction        Hip internal rotation        Hip external rotation        Knee flexion 110 130 125  128 128  Knee extension 16 lacking 0 12 lacking 5 lacking 3 lacking 5 lacking  Ankle dorsiflexion        Ankle plantarflexion        Ankle inversion        Ankle eversion         (Blank rows = not tested)  LOWER EXTREMITY MMT:  Pt is not able to complete a R SLR MMT Right eval Left eval  Hip flexion 3 5  Hip extension 4 5  Hip abduction 4 5  Hip adduction    Hip internal rotation    Hip external rotation 4 5  Knee flexion 3- 5  Knee extension 3- 5  Ankle dorsiflexion 5 5  Ankle plantarflexion 5 5  Ankle inversion    Ankle eversion     (Blank rows = not tested)  FUNCTIONAL TESTS:  5 times sit to stand: TBA 2 minute walk test: TBA  GAIT: Distance walked: 200' Assistive device utilized: Single point cane Level of assistance: Modified independence Comments: antalgic gait pattern                                                                                                          TREATMENT DATE:  [facility] Adult PT Treatment:                                                DATE: 01/21/24 Therapeutic Activities/Therex: Bike 5' Active Straight Leg Raise with Quad 3x10 - 2# LAQ - 4# - 3x10 2x10 - 6# STS  2x10 barimat table Wall squat isometrics  @~70 degrees - 2x to fatigue Step up - 4'' step   [facility] Adult PT Treatment:                                                DATE: 01/08/24 Therapeutic Exercise/Activities: Supine Quad Set 10 reps - 5'' hold w/ heel prop Active Straight Leg Raise with Quad x10 - 2# S/L hip abd - R - 2x10 - 2# Short Arc Quad x10 reps - 3 hold - 2# Seated hamstring/ankle DF stretch with pillow case 2x 60 Standing gastro strech c towel roll for ankle DF 2x  STS, staggered L leg forward, x10 Wall slides, staggered L leg forward, to 60d x10 Self care: Use of heel lift  [facility] Adult PT Treatment:                                                DATE: 01/05/24 Therapeutic Exercise: nu-step L5 69m while taking subjective and planning session with patient Supine Quad Set 10 reps - 5'' hold w/ heel prop Active Straight Leg Raise with Quad 2x10 - 2# S/L hip abd - R - 2x10 - 2# Supine Heel Slide   1 sets - 10 reps - 5 hold Short Arc Quad x10 reps - 3 hold - 2# Supine Hamstring stretch c strap  Manual Therapy: Grade ll PA mobs femur on tibia Self care: Self care re: managing activity to minimize pain and swelling of the R knee     PATIENT EDUCATION:  Education details: Eval findings, POC, HEP, self care  Person educated: Patient Education method: Explanation, Demonstration, Tactile cues, Verbal cues, and Handouts Education comprehension: verbalized understanding, returned demonstration, verbal cues required, and tactile cues required  HOME EXERCISE PROGRAM: Access Code: [email or web address] URL: [email or web address] Date: 01/08/2024 Prepared by: Dasie Daft  Exercises - Supine Quad Set  - 1 x daily - 7 x weekly - 1 sets - 10 reps - 5 hold - Active Straight Leg Raise with Quad Set  - 1 x daily - 7 x weekly - 1 sets - 10 reps - 3 hold - Supine Heel Slide  - 1 x daily - 7 x weekly - 1 sets - 10 reps - 5 hold - Short Arc Quad with Ankle Weight  - 1 x daily - 7 x weekly - 1 sets - 10 reps - 3 hold - Sidelying  Hip Abduction  - 1 x daily - 7 x weekly - 1 sets - 10 reps - 3 hold - Hooklying Clamshell with Resistance  - 1 x daily - 7 x weekly - 1 sets - 10 reps - 3 hold - Seated Table Hamstring Stretch (Mirrored)  - 1 x daily - 7 x weekly - 1 sets - 3 reps - 60 hold - Gastroc Stretch on Wall (Mirrored)  - 1 x daily - 7 x weekly - 1 sets - 3 reps - 60 hold - Staggered Sit-to-Stand  - 1 x daily - 7 x weekly - 1 sets - 10 reps ASSESSMENT:  CLINICAL IMPRESSION:  Continued with quad strengthening.  Tolerated more load with LAQ today.  Added in mid range wall slide isometrics which were more comfortable than  concentric.  Step up tolerated well with cuing to avoid glute dominant compensation.  EVAL: Patient is a 63 y.o. female who was seen today for physical therapy evaluation and treatment for Z98.890 (ICD-10-CM) - Status post arthroscopy of right knee with a lateral menisectomy. Pt presents with decreased R knee/LE strength, ROM and function. Pt will benefit from skilled PT 2w8 to address impairments to optimize R knee/LE function with less pain.   OBJECTIVE IMPAIRMENTS: decreased activity tolerance, decreased balance, decreased mobility, difficulty walking, decreased ROM, decreased strength, increased edema, and pain.   ACTIVITY LIMITATIONS: carrying, lifting, bending, sitting, standing, squatting, sleeping, stairs, bathing, toileting, dressing, hygiene/grooming, and locomotion level  PARTICIPATION LIMITATIONS: meal prep, cleaning, laundry, driving, shopping, and community activity  PERSONAL FACTORS: Age, Past/current experiences, and Time since onset of injury/illness/exacerbation are also affecting patient's functional outcome.   REHAB POTENTIAL: Good  CLINICAL DECISION MAKING: Evolving/moderate complexity  EVALUATION COMPLEXITY: Moderate   GOALS:  SHORT TERM GOALS: Target date: 01/15/24 Pt will be Ind in an initial HEP  Baseline: started Goal status: MET  2.  R knee AROM will improve to 5-120d  to progress toward improved function Baseline: 16-110 01/14/24: 5-128  Goal status: MET  3.  Pt will be able to complete SLRs 10x as demonstration of R LE strength to walk without an assistance of a SPC  Baseline:  Goal status: MET  4.  Pt will progress to walking without a SPC on level surfaces Baseline:  Goal status: MET  LONG TERM GOALS: Target date: 02/23/24  Pt will be Ind in a final HEP to maintain achieved LOF  Baseline:  Goal status: INITIAL  2.  R knee AROM will improve to 0-125d to progress toward improved function with daily activities  Baseline: 16-110d Goal status: INITIAL  3.  R hip and knee strength will demonstrate of 4+ or greater to improve ability to complete daily activities Baseline: see flow sheets Goal status: INITIAL  4.  Improve 5xSTS by MCID of 5 and by MCID of 23ft as indication of improved functional mobility  Baseline: TBA Goal status: INITIAL  5.  Pt's LEFS score will increase to 50% or greater as indication of improved function  Baseline: 25% Goal status: INITIAL  6.  Pt will walk 400' or greater s an assist device and asc/dsc 12 steps c HR assist for community ambulation, and pt will do so with demonstrate a normalized gait pattern  Baseline: antalgic and c SPC Goal status: INITIAL   PLAN:  PT FREQUENCY: 2x/week  PT DURATION: 8 weeks  PLANNED INTERVENTIONS: 97164- PT Re-evaluation, 97110-Therapeutic exercises, 97530- Therapeutic activity, 97112- Neuromuscular re-education, 97535- Self Care, [id number]- Manual therapy, [phone]- Gait training, [phone]- Aquatic Therapy, Patient/Family education, Balance training, Stair training, Taping, Joint mobilization, Cryotherapy, and Moist heat  PLAN FOR NEXT SESSION: Assess 5xSTS and when pt is able to walk without a SPC; assess response to HEP; progress therex as indicated; use of modalities, manual therapy; and TPDN as indicated.  Glennette Galster E Kaimana Lurz PT 01/21/24 12:13 PM  PHYSICAL THERAPY  DISCHARGE SUMMARY  Visits from Start of Care: 8  Current functional level related to goals / functional outcomes: See clinical impression and PT goals    Remaining deficits: See clinical impression and PT goals   Education / Equipment: HRP. Pt Ed   Patient agrees to discharge. Patient goals were partially met. Patient is being discharged due to not returning since the last visit. Attendance issues.

## 2024-02-03 ENCOUNTER — Encounter: Payer: Self-pay | Admitting: Orthopaedic Surgery

## 2024-02-03 ENCOUNTER — Ambulatory Visit: Admitting: Orthopaedic Surgery

## 2024-02-03 DIAGNOSIS — Z9889 Other specified postprocedural states: Secondary | ICD-10-CM

## 2024-02-03 NOTE — Progress Notes (Signed)
 The patient is now 22-month status post a right knee arthroscopy with a partial lateral meniscectomy which was a significant lateral meniscal tear.  She is a very active and young appearing 48.  She has been working on strengthening exercises and did return to work last week.  She says she is doing well overall.  She is wearing a knee sleeve and she says this does provide her some support and compression.  Examination of her right knee shows only mild swelling today with good range of motion and some lateral tenderness to be expected.  She says the knee does pop on occasion but overall it feels ligamentously stable.  She will continue to increase her activities as comfort allows.  Will see her back for final visit in 3 months unless there are issues.

## 2024-02-05 ENCOUNTER — Ambulatory Visit

## 2024-02-08 NOTE — Therapy (Incomplete)
 OUTPATIENT PHYSICAL THERAPY LOWER EXTREMITY TREATMENT   Patient Name: Bailey Patterson MRN: [id number] DOB:Jun 15, 1960, 63 y.o., female Today's Date: 02/08/2024  END OF SESSION:        Past Medical History:  Diagnosis Date   Acute bilateral low back pain without sciatica 02/02/2020   ANXIETY 11/16/2009   CHEST WALL PAIN, ACUTE 04/16/2010   Constipation    Cough 02/02/2020   COVID-19 11/27/2022   DVT (deep venous thrombosis) (HCC)    FATIGUE 03/28/2009   Fibromyalgia    Hernia    KNEE PAIN 02/04/2010   MENTAL CONFUSION 11/16/2009   Migraine headache 12/20/2010   Chronic and persistent   MIGRAINE, COMMON W/INTRACTABLE MIGRAINE 03/28/2009   Nausea alone 03/28/2009   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   Paresthesia 12/20/2010   Mild   Phlebitis, complicating pregnancy or puerperium    Presence of IVC filter    Rectal pain, chronic 08/11/2012   4/14    Vertigo    Past Surgical History:  Procedure Laterality Date   CESAREAN SECTION     ENDOMETRIAL ABLATION     HERNIA REPAIR     KNEE ARTHROSCOPY WITH LATERAL MENISECTOMY Right 12/03/2023   Procedure: ARTHROSCOPY, KNEE, WITH PARTIAL LATERAL MENISCECTOMY;  Surgeon: Vernetta Lonni GRADE, MD;  Location: [facility];  Service: Orthopedics;  Laterality: Right;   Patient Active Problem List   Diagnosis Date Noted   Acute lateral meniscus tear of right knee 12/03/2023   Complex tear of lateral meniscus of right knee as current injury 12/02/2023   Other fatigue 10/13/2022   Fibromyalgia 08/27/2022   Sleep disorder 05/28/2022   Gastroesophageal reflux disease without esophagitis 04/11/2022   Vitamin D  deficiency 12/27/2021   Postmenopausal bleeding 12/27/2021   Mild intermittent asthma without complication 12/26/2021   History of COVID-19 02/02/2020   Chronic migraine without aura, with intractable migraine, so stated, with status migrainosus 07/16/2017   Other constipation 08/11/2012    PCP: Lucius Krabbe, NP   REFERRING PROVIDER: Vernetta Lonni GRADE, MD   REFERRING DIAG: [phone] (ICD-10-CM) - Status post arthroscopy of right knee   THERAPY DIAG:  No diagnosis found.  Rationale for Evaluation and Treatment: Rehabilitation  ONSET DATE: 12/03/23- surgery  SUBJECTIVE:   SUBJECTIVE STATEMENT: Pt reports that she is improving but is still limited in walking for longer periods.  PERTINENT HISTORY: See above  PAIN:  Are you having pain? Yes: NPRS scale: 2/10 Pain location: R knee joint line Pain description: ache Aggravating factors: prolonged positions, bending  Relieving factors: Rest, cold pack, lidocaine  patch  PRECAUTIONS: Pt states she was told to wear her knee brace for 2 weeks  RED FLAGS: None   WEIGHT BEARING RESTRICTIONS: No  FALLS:  Has patient fallen in last 6 months? No  LIVING ENVIRONMENT: Lives with: lives alone Lives in: House/apartment Stairs: Yes: External: 17 steps; can reach both Has following equipment at home: Single point cane  OCCUPATION: FMLA- CNA in homes  PLOF: Independent  PATIENT GOALS: To walk normal, to wear heels, and to go out drancing   NEXT MD VISIT: 01/07/24  OBJECTIVE:  Note: Objective measures were completed at Evaluation unless otherwise noted.  DIAGNOSTIC FINDINGS:  08/23/23  IMPRESSION: 1. Bucket-handle tear of the lateral meniscus flipped towards the intercondylar notch. 2. Tricompartmental cartilage abnormalities as described above.  PATIENT SURVEYS:  LEFS; 20/80=25%  COGNITION: Overall cognitive status: Within functional limits for tasks assessed     SENSATION: WFL  EDEMA:   Swelling the R knee  MUSCLE  LENGTH: Hamstrings: Right WNLs deg; Left WNLs deg  POSTURE: No Significant postural limitations  PALPATION: TTP of the anterior R knee joint spaces  LOWER EXTREMITY ROM:  Active ROM Right eval Left eval Rt 12/25/23 Rt 8/19 RT 01/05/24 RT 01/14/24  Hip flexion        Hip extension         Hip abduction        Hip adduction        Hip internal rotation        Hip external rotation        Knee flexion 110 130 125  128 128  Knee extension 16 lacking 0 12 lacking 5 lacking 3 lacking 5 lacking  Ankle dorsiflexion        Ankle plantarflexion        Ankle inversion        Ankle eversion         (Blank rows = not tested)  LOWER EXTREMITY MMT:  Pt is not able to complete a R SLR MMT Right eval Left eval  Hip flexion 3 5  Hip extension 4 5  Hip abduction 4 5  Hip adduction    Hip internal rotation    Hip external rotation 4 5  Knee flexion 3- 5  Knee extension 3- 5  Ankle dorsiflexion 5 5  Ankle plantarflexion 5 5  Ankle inversion    Ankle eversion     (Blank rows = not tested)  FUNCTIONAL TESTS:  5 times sit to stand: TBA 2 minute walk test: TBA  GAIT: Distance walked: 200' Assistive device utilized: Single point cane Level of assistance: Modified independence Comments: antalgic gait pattern                                                                                                          TREATMENT DATE:  [facility] Adult PT Treatment:           10 wks s/p sx                                     DATE: 02/09/24 Therapeutic Activities/Therex: Bike 5' Active Straight Leg Raise with Quad 3x10 - 2# LAQ - 4# - 3x10 2x10 - 6# STS 2x10 barimat table Wall squat isometrics @~70 degrees - 2x to fatigue Step up - 4'' step Supine Quad Set 10 reps - 5'' hold w/ heel prop Active Straight Leg Raise with Quad x10 - 2# S/L hip abd - R - 2x10 - 2# Short Arc Quad x10 reps - 3 hold - 2# Seated hamstring/ankle DF stretch with pillow case 2x 60 Standing gastro strech c towel roll for ankle DF 2x  STS, staggered L leg forward, x10 Wall slides, staggered L leg forward, to 60d x10 Therapeutic Exercise: *** Manual Therapy: *** Neuromuscular re-ed: *** Therapeutic Activity: *** Modalities: *** Self Care: ***  [name] Adult PT Treatment:  DATE: 01/21/24 Therapeutic Activities/Therex: Bike 5' Active Straight Leg Raise with Quad 3x10 - 2# LAQ - 4# - 3x10 2x10 - 6# STS 2x10 barimat table Wall squat isometrics @~70 degrees - 2x to fatigue Step up - 4'' step   [facility] Adult PT Treatment:                                                DATE: 01/08/24 Therapeutic Exercise/Activities: Supine Quad Set 10 reps - 5'' hold w/ heel prop Active Straight Leg Raise with Quad x10 - 2# S/L hip abd - R - 2x10 - 2# Short Arc Quad x10 reps - 3 hold - 2# Seated hamstring/ankle DF stretch with pillow case 2x 60 Standing gastro strech c towel roll for ankle DF 2x  STS, staggered L leg forward, x10 Wall slides, staggered L leg forward, to 60d x10 Self care: Use of heel lift  [facility] Adult PT Treatment:                                                DATE: 01/05/24 Therapeutic Exercise: nu-step L5 51m while taking subjective and planning session with patient Supine Quad Set 10 reps - 5'' hold w/ heel prop Active Straight Leg Raise with Quad 2x10 - 2# S/L hip abd - R - 2x10 - 2# Supine Heel Slide   1 sets - 10 reps - 5 hold Short Arc Quad x10 reps - 3 hold - 2# Supine Hamstring stretch c strap  Manual Therapy: Grade ll PA mobs femur on tibia Self care: Self care re: managing activity to minimize pain and swelling of the R knee     PATIENT EDUCATION:  Education details: Eval findings, POC, HEP, self care  Person educated: Patient Education method: Explanation, Demonstration, Tactile cues, Verbal cues, and Handouts Education comprehension: verbalized understanding, returned demonstration, verbal cues required, and tactile cues required  HOME EXERCISE PROGRAM: Access Code: [email or web address] URL: [email or web address] Date: 01/08/2024 Prepared by: Dasie Daft  Exercises - Supine Quad Set  - 1 x daily - 7 x weekly - 1 sets - 10 reps - 5 hold - Active Straight Leg Raise with Quad Set  - 1 x daily - 7 x weekly - 1 sets -  10 reps - 3 hold - Supine Heel Slide  - 1 x daily - 7 x weekly - 1 sets - 10 reps - 5 hold - Short Arc Quad with Ankle Weight  - 1 x daily - 7 x weekly - 1 sets - 10 reps - 3 hold - Sidelying Hip Abduction  - 1 x daily - 7 x weekly - 1 sets - 10 reps - 3 hold - Hooklying Clamshell with Resistance  - 1 x daily - 7 x weekly - 1 sets - 10 reps - 3 hold - Seated Table Hamstring Stretch (Mirrored)  - 1 x daily - 7 x weekly - 1 sets - 3 reps - 60 hold - Gastroc Stretch on Wall (Mirrored)  - 1 x daily - 7 x weekly - 1 sets - 3 reps - 60 hold - Staggered Sit-to-Stand  - 1 x daily - 7 x weekly - 1 sets - 10 reps ASSESSMENT:  CLINICAL IMPRESSION:  Continued  with quad strengthening.  Tolerated more load with LAQ today.  Added in mid range wall slide isometrics which were more comfortable than concentric.  Step up tolerated well with cuing to avoid glute dominant compensation.  EVAL: Patient is a 63 y.o. female who was seen today for physical therapy evaluation and treatment for Z98.890 (ICD-10-CM) - Status post arthroscopy of right knee with a lateral menisectomy. Pt presents with decreased R knee/LE strength, ROM and function. Pt will benefit from skilled PT 2w8 to address impairments to optimize R knee/LE function with less pain.   OBJECTIVE IMPAIRMENTS: decreased activity tolerance, decreased balance, decreased mobility, difficulty walking, decreased ROM, decreased strength, increased edema, and pain.   ACTIVITY LIMITATIONS: carrying, lifting, bending, sitting, standing, squatting, sleeping, stairs, bathing, toileting, dressing, hygiene/grooming, and locomotion level  PARTICIPATION LIMITATIONS: meal prep, cleaning, laundry, driving, shopping, and community activity  PERSONAL FACTORS: Age, Past/current experiences, and Time since onset of injury/illness/exacerbation are also affecting patient's functional outcome.   REHAB POTENTIAL: Good  CLINICAL DECISION MAKING: Evolving/moderate  complexity  EVALUATION COMPLEXITY: Moderate   GOALS:  SHORT TERM GOALS: Target date: 01/15/24 Pt will be Ind in an initial HEP  Baseline: started Goal status: MET  2.  R knee AROM will improve to 5-120d to progress toward improved function Baseline: 16-110 01/14/24: 5-128  Goal status: MET  3.  Pt will be able to complete SLRs 10x as demonstration of R LE strength to walk without an assistance of a SPC  Baseline:  Goal status: MET  4.  Pt will progress to walking without a SPC on level surfaces Baseline:  Goal status: MET  LONG TERM GOALS: Target date: 02/23/24  Pt will be Ind in a final HEP to maintain achieved LOF  Baseline:  Goal status: INITIAL  2.  R knee AROM will improve to 0-125d to progress toward improved function with daily activities  Baseline: 16-110d Goal status: INITIAL  3.  R hip and knee strength will demonstrate of 4+ or greater to improve ability to complete daily activities Baseline: see flow sheets Goal status: INITIAL  4.  Improve 5xSTS by MCID of 5 and by MCID of 43ft as indication of improved functional mobility  Baseline: TBA Goal status: INITIAL  5.  Pt's LEFS score will increase to 50% or greater as indication of improved function  Baseline: 25% Goal status: INITIAL  6.  Pt will walk 400' or greater s an assist device and asc/dsc 12 steps c HR assist for community ambulation, and pt will do so with demonstrate a normalized gait pattern  Baseline: antalgic and c SPC Goal status: INITIAL   PLAN:  PT FREQUENCY: 2x/week  PT DURATION: 8 weeks  PLANNED INTERVENTIONS: 97164- PT Re-evaluation, 97110-Therapeutic exercises, 97530- Therapeutic activity, 97112- Neuromuscular re-education, 97535- Self Care, [id number]- Manual therapy, [phone]- Gait training, [phone]- Aquatic Therapy, Patient/Family education, Balance training, Stair training, Taping, Joint mobilization, Cryotherapy, and Moist heat  PLAN FOR NEXT SESSION: Assess 5xSTS and when pt  is able to walk without a SPC; assess response to HEP; progress therex as indicated; use of modalities, manual therapy; and TPDN as indicated.  Io Dieujuste PT 02/08/24 11:16 AM

## 2024-02-09 ENCOUNTER — Ambulatory Visit

## 2024-02-10 ENCOUNTER — Telehealth: Payer: Self-pay

## 2024-02-10 NOTE — Telephone Encounter (Signed)
 Spoke to pt by phone re; 2nd no show appt. Pt states she forgot with returning to work. Pt was advised of attendance policy and of her next appt.

## 2024-02-16 NOTE — Therapy (Incomplete)
 OUTPATIENT PHYSICAL THERAPY LOWER EXTREMITY TREATMENT   Patient Name: Bailey Patterson MRN: [id number] DOB:1960/12/10, 63 y.o., female Today's Date: 02/16/2024  END OF SESSION:        Past Medical History:  Diagnosis Date   Acute bilateral low back pain without sciatica 02/02/2020   ANXIETY 11/16/2009   CHEST WALL PAIN, ACUTE 04/16/2010   Constipation    Cough 02/02/2020   COVID-19 11/27/2022   DVT (deep venous thrombosis) (HCC)    FATIGUE 03/28/2009   Fibromyalgia    Hernia    KNEE PAIN 02/04/2010   MENTAL CONFUSION 11/16/2009   Migraine headache 12/20/2010   Chronic and persistent   MIGRAINE, COMMON W/INTRACTABLE MIGRAINE 03/28/2009   Nausea alone 03/28/2009   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   Paresthesia 12/20/2010   Mild   Phlebitis, complicating pregnancy or puerperium    Presence of IVC filter    Rectal pain, chronic 08/11/2012   4/14    Vertigo    Past Surgical History:  Procedure Laterality Date   CESAREAN SECTION     ENDOMETRIAL ABLATION     HERNIA REPAIR     KNEE ARTHROSCOPY WITH LATERAL MENISECTOMY Right 12/03/2023   Procedure: ARTHROSCOPY, KNEE, WITH PARTIAL LATERAL MENISCECTOMY;  Surgeon: Bailey Lonni GRADE, MD;  Location: [facility];  Service: Orthopedics;  Laterality: Right;   Patient Active Problem List   Diagnosis Date Noted   Acute lateral meniscus tear of right knee 12/03/2023   Complex tear of lateral meniscus of right knee as current injury 12/02/2023   Other fatigue 10/13/2022   Fibromyalgia 08/27/2022   Sleep disorder 05/28/2022   Gastroesophageal reflux disease without esophagitis 04/11/2022   Vitamin D  deficiency 12/27/2021   Postmenopausal bleeding 12/27/2021   Mild intermittent asthma without complication 12/26/2021   History of COVID-19 02/02/2020   Chronic migraine without aura, with intractable migraine, so stated, with status migrainosus 07/16/2017   Other constipation 08/11/2012    PCP: Bailey Krabbe, NP   REFERRING PROVIDER: Vernetta Lonni GRADE, MD   REFERRING DIAG: [phone] (ICD-10-CM) - Status post arthroscopy of right knee   THERAPY DIAG:  No diagnosis found.  Rationale for Evaluation and Treatment: Rehabilitation  ONSET DATE: 12/03/23- surgery  SUBJECTIVE:   SUBJECTIVE STATEMENT: Pt reports that she is improving but is still limited in walking for longer periods.  PERTINENT HISTORY: See above  PAIN:  Are you having pain? Yes: NPRS scale: 2/10 Pain location: R knee joint line Pain description: ache Aggravating factors: prolonged positions, bending  Relieving factors: Rest, cold pack, lidocaine  patch  PRECAUTIONS: Pt states she was told to wear her knee brace for 2 weeks  RED FLAGS: None   WEIGHT BEARING RESTRICTIONS: No  FALLS:  Has patient fallen in last 6 months? No  LIVING ENVIRONMENT: Lives with: lives alone Lives in: House/apartment Stairs: Yes: External: 17 steps; can reach both Has following equipment at home: Single point cane  OCCUPATION: FMLA- CNA in homes  PLOF: Independent  PATIENT GOALS: To walk normal, to wear heels, and to go out drancing   NEXT MD VISIT: 01/07/24  OBJECTIVE:  Note: Objective measures were completed at Evaluation unless otherwise noted.  DIAGNOSTIC FINDINGS:  08/23/23  IMPRESSION: 1. Bucket-handle tear of the lateral meniscus flipped towards the intercondylar notch. 2. Tricompartmental cartilage abnormalities as described above.  PATIENT SURVEYS:  LEFS; 20/80=25%  COGNITION: Overall cognitive status: Within functional limits for tasks assessed     SENSATION: WFL  EDEMA:   Swelling the R knee  MUSCLE  LENGTH: Hamstrings: Right WNLs deg; Left WNLs deg  POSTURE: No Significant postural limitations  PALPATION: TTP of the anterior R knee joint spaces  LOWER EXTREMITY ROM:  Active ROM Right eval Left eval Rt 12/25/23 Rt 8/19 RT 01/05/24 RT 01/14/24  Hip flexion        Hip extension         Hip abduction        Hip adduction        Hip internal rotation        Hip external rotation        Knee flexion 110 130 125  128 128  Knee extension 16 lacking 0 12 lacking 5 lacking 3 lacking 5 lacking  Ankle dorsiflexion        Ankle plantarflexion        Ankle inversion        Ankle eversion         (Blank rows = not tested)  LOWER EXTREMITY MMT:  Pt is not able to complete a R SLR MMT Right eval Left eval  Hip flexion 3 5  Hip extension 4 5  Hip abduction 4 5  Hip adduction    Hip internal rotation    Hip external rotation 4 5  Knee flexion 3- 5  Knee extension 3- 5  Ankle dorsiflexion 5 5  Ankle plantarflexion 5 5  Ankle inversion    Ankle eversion     (Blank rows = not tested)  FUNCTIONAL TESTS:  5 times sit to stand: TBA 2 minute walk test: TBA  GAIT: Distance walked: 200' Assistive device utilized: Single point cane Level of assistance: Modified independence Comments: antalgic gait pattern                                                                                                          TREATMENT DATE:  [facility] Adult PT Treatment:           10 wks s/p sx                                     DATE: 02/17/24 Therapeutic Activities/Therex: Bike 5' Active Straight Leg Raise with Quad 3x10 - 2# LAQ - 4# - 3x10 2x10 - 6# STS 2x10 barimat table Wall squat isometrics @~70 degrees - 2x to fatigue Step up - 4'' step Supine Quad Set 10 reps - 5'' hold w/ heel prop Active Straight Leg Raise with Quad x10 - 2# S/L hip abd - R - 2x10 - 2# Short Arc Quad x10 reps - 3 hold - 2# Seated hamstring/ankle DF stretch with pillow case 2x 60 Standing gastro strech c towel roll for ankle DF 2x  STS, staggered L leg forward, x10 Wall slides, staggered L leg forward, to 60d x10 Therapeutic Exercise: *** Manual Therapy: *** Neuromuscular re-ed: *** Therapeutic Activity: *** Modalities: *** Self Care: ***  Bailey Patterson Adult PT Treatment:  DATE: 01/21/24 Therapeutic Activities/Therex: Bike 5' Active Straight Leg Raise with Quad 3x10 - 2# LAQ - 4# - 3x10 2x10 - 6# STS 2x10 barimat table Wall squat isometrics @~70 degrees - 2x to fatigue Step up - 4'' step   [facility] Adult PT Treatment:                                                DATE: 01/08/24 Therapeutic Exercise/Activities: Supine Quad Set 10 reps - 5'' hold w/ heel prop Active Straight Leg Raise with Quad x10 - 2# S/L hip abd - R - 2x10 - 2# Short Arc Quad x10 reps - 3 hold - 2# Seated hamstring/ankle DF stretch with pillow case 2x 60 Standing gastro strech c towel roll for ankle DF 2x  STS, staggered L leg forward, x10 Wall slides, staggered L leg forward, to 60d x10 Self care: Use of heel lift  [facility] Adult PT Treatment:                                                DATE: 01/05/24 Therapeutic Exercise: nu-step L5 74m while taking subjective and planning session with patient Supine Quad Set 10 reps - 5'' hold w/ heel prop Active Straight Leg Raise with Quad 2x10 - 2# S/L hip abd - R - 2x10 - 2# Supine Heel Slide   1 sets - 10 reps - 5 hold Short Arc Quad x10 reps - 3 hold - 2# Supine Hamstring stretch c strap  Manual Therapy: Patterson ll PA mobs femur on tibia Self care: Self care re: managing activity to minimize pain and swelling of the R knee     PATIENT EDUCATION:  Education details: Eval findings, POC, HEP, self care  Person educated: Patient Education method: Explanation, Demonstration, Tactile cues, Verbal cues, and Handouts Education comprehension: verbalized understanding, returned demonstration, verbal cues required, and tactile cues required  HOME EXERCISE PROGRAM: Access Code: [email or web address] URL: [email or web address] Date: 01/08/2024 Prepared by: Dasie Daft  Exercises - Supine Quad Set  - 1 x daily - 7 x weekly - 1 sets - 10 reps - 5 hold - Active Straight Leg Raise with Quad Set  - 1 x daily - 7 x weekly - 1 sets -  10 reps - 3 hold - Supine Heel Slide  - 1 x daily - 7 x weekly - 1 sets - 10 reps - 5 hold - Short Arc Quad with Ankle Weight  - 1 x daily - 7 x weekly - 1 sets - 10 reps - 3 hold - Sidelying Hip Abduction  - 1 x daily - 7 x weekly - 1 sets - 10 reps - 3 hold - Hooklying Clamshell with Resistance  - 1 x daily - 7 x weekly - 1 sets - 10 reps - 3 hold - Seated Table Hamstring Stretch (Mirrored)  - 1 x daily - 7 x weekly - 1 sets - 3 reps - 60 hold - Gastroc Stretch on Wall (Mirrored)  - 1 x daily - 7 x weekly - 1 sets - 3 reps - 60 hold - Staggered Sit-to-Stand  - 1 x daily - 7 x weekly - 1 sets - 10 reps ASSESSMENT:  CLINICAL IMPRESSION:  Continued  with quad strengthening.  Tolerated more load with LAQ today.  Added in mid range wall slide isometrics which were more comfortable than concentric.  Step up tolerated well with cuing to avoid glute dominant compensation.  EVAL: Patient is a 63 y.o. female who was seen today for physical therapy evaluation and treatment for Z98.890 (ICD-10-CM) - Status post arthroscopy of right knee with a lateral menisectomy. Pt presents with decreased R knee/LE strength, ROM and function. Pt will benefit from skilled PT 2w8 to address impairments to optimize R knee/LE function with less pain.   OBJECTIVE IMPAIRMENTS: decreased activity tolerance, decreased balance, decreased mobility, difficulty walking, decreased ROM, decreased strength, increased edema, and pain.   ACTIVITY LIMITATIONS: carrying, lifting, bending, sitting, standing, squatting, sleeping, stairs, bathing, toileting, dressing, hygiene/grooming, and locomotion level  PARTICIPATION LIMITATIONS: meal prep, cleaning, laundry, driving, shopping, and community activity  PERSONAL FACTORS: Age, Past/current experiences, and Time since onset of injury/illness/exacerbation are also affecting patient's functional outcome.   REHAB POTENTIAL: Good  CLINICAL DECISION MAKING: Evolving/moderate  complexity  EVALUATION COMPLEXITY: Moderate   GOALS:  SHORT TERM GOALS: Target date: 01/15/24 Pt will be Ind in an initial HEP  Baseline: started Goal status: MET  2.  R knee AROM will improve to 5-120d to progress toward improved function Baseline: 16-110 01/14/24: 5-128  Goal status: MET  3.  Pt will be able to complete SLRs 10x as demonstration of R LE strength to walk without an assistance of a SPC  Baseline:  Goal status: MET  4.  Pt will progress to walking without a SPC on level surfaces Baseline:  Goal status: MET  LONG TERM GOALS: Target date: 02/23/24  Pt will be Ind in a final HEP to maintain achieved LOF  Baseline:  Goal status: INITIAL  2.  R knee AROM will improve to 0-125d to progress toward improved function with daily activities  Baseline: 16-110d Goal status: INITIAL  3.  R hip and knee strength will demonstrate of 4+ or greater to improve ability to complete daily activities Baseline: see flow sheets Goal status: INITIAL  4.  Improve 5xSTS by MCID of 5 and by MCID of 97ft as indication of improved functional mobility  Baseline: TBA Goal status: INITIAL  5.  Pt's LEFS score will increase to 50% or greater as indication of improved function  Baseline: 25% Goal status: INITIAL  6.  Pt will walk 400' or greater s an assist device and asc/dsc 12 steps c HR assist for community ambulation, and pt will do so with demonstrate a normalized gait pattern  Baseline: antalgic and c SPC Goal status: INITIAL   PLAN:  PT FREQUENCY: 2x/week  PT DURATION: 8 weeks  PLANNED INTERVENTIONS: 97164- PT Re-evaluation, 97110-Therapeutic exercises, 97530- Therapeutic activity, 97112- Neuromuscular re-education, 97535- Self Care, 02859- Manual therapy, [phone]- Gait training, [phone]- Aquatic Therapy, Patient/Family education, Balance training, Stair training, Taping, Joint mobilization, Cryotherapy, and Moist heat  PLAN FOR NEXT SESSION: Assess 5xSTS and when pt  is able to walk without a SPC; assess response to HEP; progress therex as indicated; use of modalities, manual therapy; and TPDN as indicated.  Taliesin Hartlage PT 02/16/24 9:16 AM

## 2024-02-17 ENCOUNTER — Ambulatory Visit: Attending: Family

## 2024-02-17 ENCOUNTER — Telehealth: Payer: Self-pay

## 2024-02-17 NOTE — Telephone Encounter (Signed)
 Spoke to pt about her 3rd no show appt. Pt was advised she was being Dced from PT services at this time, but that she could return to PT with a new referral from Dr. Vernetta.

## 2024-02-18 ENCOUNTER — Encounter

## 2024-02-22 ENCOUNTER — Encounter: Payer: Self-pay | Admitting: Podiatry

## 2024-02-22 ENCOUNTER — Ambulatory Visit (INDEPENDENT_AMBULATORY_CARE_PROVIDER_SITE_OTHER)

## 2024-02-22 ENCOUNTER — Ambulatory Visit (INDEPENDENT_AMBULATORY_CARE_PROVIDER_SITE_OTHER): Admitting: Podiatry

## 2024-02-22 VITALS — Ht 62.0 in | Wt 131.6 lb

## 2024-02-22 DIAGNOSIS — M79671 Pain in right foot: Secondary | ICD-10-CM

## 2024-02-22 DIAGNOSIS — B351 Tinea unguium: Secondary | ICD-10-CM | POA: Diagnosis not present

## 2024-02-22 DIAGNOSIS — M79672 Pain in left foot: Secondary | ICD-10-CM | POA: Diagnosis not present

## 2024-02-22 MED ORDER — TERBINAFINE HCL 250 MG PO TABS: 250.0000 mg | ORAL_TABLET | Freq: Every day | ORAL | 0 refills | Status: AC

## 2024-02-22 MED ORDER — CLOTRIMAZOLE-BETAMETHASONE 1-0.05 % EX CREA: 1.0000 | TOPICAL_CREAM | Freq: Every day | CUTANEOUS | 2 refills | Status: AC

## 2024-02-23 ENCOUNTER — Telehealth: Payer: Self-pay | Admitting: Orthopaedic Surgery

## 2024-02-23 NOTE — Telephone Encounter (Signed)
 Pt states she having very severe right hip/back/buttock pain with stiffness and burning in the right knee. Pt states she had right knee surgery on 12/03/23 and the pain started about week a ago and has only gotten worse.

## 2024-02-23 NOTE — Telephone Encounter (Signed)
 Called and scheduled patient

## 2024-02-23 NOTE — Telephone Encounter (Signed)
 CALLED PT AND LEFT VM PROVIDER OUT OF OFFICE NEED TO R/S

## 2024-02-24 ENCOUNTER — Other Ambulatory Visit: Payer: Self-pay

## 2024-02-24 ENCOUNTER — Ambulatory Visit: Admitting: Physician Assistant

## 2024-02-24 ENCOUNTER — Other Ambulatory Visit: Payer: Self-pay | Admitting: Radiology

## 2024-02-24 DIAGNOSIS — M5441 Lumbago with sciatica, right side: Secondary | ICD-10-CM

## 2024-02-24 DIAGNOSIS — R220 Localized swelling, mass and lump, head: Secondary | ICD-10-CM | POA: Diagnosis not present

## 2024-02-24 DIAGNOSIS — M7061 Trochanteric bursitis, right hip: Secondary | ICD-10-CM | POA: Diagnosis not present

## 2024-02-24 MED ORDER — METHOCARBAMOL 500 MG PO TABS: 500.0000 mg | ORAL_TABLET | Freq: Every evening | ORAL | 1 refills | Status: DC | PRN

## 2024-02-24 MED ORDER — METHOCARBAMOL 500 MG PO TABS: 500.0000 mg | ORAL_TABLET | Freq: Every evening | ORAL | 1 refills | Status: AC | PRN

## 2024-02-24 MED ORDER — LIDOCAINE HCL 1 % IJ SOLN
3.0000 mL | INTRAMUSCULAR | Status: AC | PRN
  Administered 2024-02-24: 3 mL

## 2024-02-24 MED ORDER — METHYLPREDNISOLONE 4 MG PO TABS: ORAL_TABLET | ORAL | 0 refills | Status: AC

## 2024-02-24 MED ORDER — METHYLPREDNISOLONE ACETATE 40 MG/ML IJ SUSP
40.0000 mg | INTRAMUSCULAR | Status: AC | PRN
  Administered 2024-02-24: 40 mg via INTRA_ARTICULAR

## 2024-02-24 MED ORDER — METHYLPREDNISOLONE 4 MG PO TABS: ORAL_TABLET | ORAL | 0 refills | Status: DC

## 2024-02-24 NOTE — Progress Notes (Addendum)
 Office Visit Note   Patient: Bailey Patterson           Date of Birth: 04-07-1961           MRN: [id number] Visit Date: 02/24/2024              Requested by: Lucius Krabbe, NP [address] [place],  [place] [address] PCP: Lucius Krabbe, NP   Assessment & Plan: Visit Diagnoses:  1. Acute right-sided low back pain with right-sided sciatica     Plan: Explained to her that I feel she has 2 things going on at this point in time right hip trochanteric bursitis and lumbar radiculopathy right leg.  Therefore we will send her for physical therapy for her back that will include hamstring stretching home exercise program and modalities.  In regards to the hip offered her a right hip trochanteric injection she is agreeable.  We placed her on Medrol  Dosepak and Robaxin  no NSAIDs while on the the Medrol  Dosepak.  She will follow-up with us  in 4 weeks sooner if there is any questions concerns.  Follow-Up Instructions: Return in about 4 weeks (around 03/23/2024).   Orders:  Orders Placed This Encounter  Procedures   XR Lumbar Spine 2-3 Views   Meds ordered this encounter  Medications   methylPREDNISolone  (MEDROL ) 4 MG tablet    Sig: Take as directed Day 1 3 tablets in the morning 3 tablets in the evening day 2 3 tablets in the morning 2 tablets in the evening day 3 2 tablets in the morning 2 tablets in the evening, day 4  2 tablets in the morning 2 tablets in the evening, day 5 2 tablets in the morning, daily six 1 tablet in the morning    Dispense:  21 tablet    Refill:  0   methocarbamol  (ROBAXIN ) 500 MG tablet    Sig: Take 1 tablet (500 mg total) by mouth at bedtime as needed for muscle spasms.    Dispense:  30 tablet    Refill:  1      Procedures: Large Joint Inj: R greater trochanter on 02/24/2024 12:08 PM Indications: pain Details: 22 G 1.5 in needle, lateral approach  Arthrogram: No  Medications: 3 mL lidocaine  1 %; 40 mg methylPREDNISolone  acetate 40  MG/ML Outcome: tolerated well, no immediate complications Procedure, treatment alternatives, risks and benefits explained, specific risks discussed. Consent was given by the patient. Immediately prior to procedure a time out was called to verify the correct patient, procedure, equipment, support staff and site/side marked as required. Patient was prepped and draped in the usual sterile fashion.       Clinical Data: No additional findings.   Subjective: Chief Complaint  Patient presents with   Lower Back - Pain   Right Knee - Pain    HPI [name] returns today status post right knee arthroscopy 12/03/2023.  Knee overall is doing well.  However she is now having right sided low back pain and hip pain.  She states that she has waking pain due to the back in the hip.  She is unable to lie on her right hip.  She denies any saddle anesthesia, weight changes fevers chills.  She has no bowel or bladder incontinence but has constipation.  She has tried Tylenol , NSAIDs and hydrocodone  without any real relief.  Pain is constant.  Has some numbness down to her knee.  No known injury.  Review of Systems See HPI  Objective: Vital Signs: There  were no vitals taken for this visit.  Physical Exam Constitutional:      Appearance: She is normal weight. She is not ill-appearing or diaphoretic.  Pulmonary:     Effort: Pulmonary effort is normal.  Neurological:     Mental Status: She is alert and oriented to person, place, and time.  Psychiatric:        Mood and Affect: Mood normal.     Ortho Exam Bilateral feet: Dorsal pedal pulses are 2+ and equal symmetric.  Sensation grossly intact to light touch throughout bilaterally. Lower extremities: 5 out of 5 strength throughout the lower extremities against resistance.  Negative straight leg raise bilaterally.  Good range of motion of both hips without pain.  She has tight hamstrings bilaterally.  Tenderness over the right hip trochanteric region and  over the right lower lumbar region.  She has full forward flexion of the lumbar spine and slightly limited extension lumbar spine. Bilateral knees good range of motion without any significant pain.  No gross instability of either knee.  Well-healed port sites right knee.  Specialty Comments:  No specialty comments available.  Imaging: XR Lumbar Spine 2-3 Views Result Date: 02/24/2024 Lumbar spine 2 views: Normal lordotic curvature.  Disc space well-maintained.  No significant arthropathy.  No spondylolisthesis.  No acute fractures acute findings.    PMFS History: Patient Active Problem List   Diagnosis Date Noted   Acute lateral meniscus tear of right knee 12/03/2023   Complex tear of lateral meniscus of right knee as current injury 12/02/2023   Other fatigue 10/13/2022   Fibromyalgia 08/27/2022   Sleep disorder 05/28/2022   Gastroesophageal reflux disease without esophagitis 04/11/2022   Vitamin D  deficiency 12/27/2021   Postmenopausal bleeding 12/27/2021   Mild intermittent asthma without complication 12/26/2021   History of COVID-19 02/02/2020   Chronic migraine without aura, with intractable migraine, so stated, with status migrainosus 07/16/2017   Other constipation 08/11/2012   Past Medical History:  Diagnosis Date   Acute bilateral low back pain without sciatica 02/02/2020   ANXIETY 11/16/2009   CHEST WALL PAIN, ACUTE 04/16/2010   Constipation    Cough 02/02/2020   COVID-19 11/27/2022   DVT (deep venous thrombosis) (HCC)    FATIGUE 03/28/2009   Fibromyalgia    Hernia    KNEE PAIN 02/04/2010   MENTAL CONFUSION 11/16/2009   Migraine headache 12/20/2010   Chronic and persistent   MIGRAINE, COMMON W/INTRACTABLE MIGRAINE 03/28/2009   Nausea alone 03/28/2009   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   Paresthesia 12/20/2010   Mild   Phlebitis, complicating pregnancy or puerperium    Presence of IVC filter    Rectal pain, chronic 08/11/2012   4/14    Vertigo     Family  History  Problem Relation Age of Onset   Diabetes Mother    Heart disease Mother    Kidney disease Mother    Hypertension Mother    Stroke Mother    Cervical cancer Mother    Diabetes Other    Hypertension Other    Kidney disease Other    Cancer Father     Past Surgical History:  Procedure Laterality Date   CESAREAN SECTION     ENDOMETRIAL ABLATION     HERNIA REPAIR     KNEE ARTHROSCOPY WITH LATERAL MENISECTOMY Right 12/03/2023   Procedure: ARTHROSCOPY, KNEE, WITH PARTIAL LATERAL MENISCECTOMY;  Surgeon: Vernetta Lonni GRADE, MD;  Location: [facility];  Service: Orthopedics;  Laterality: Right;   Social History  Occupational History   Not on file  Tobacco Use   Smoking status: Never   Smokeless tobacco: Never  Vaping Use   Vaping status: Never Used  Substance and Sexual Activity   Alcohol use: No   Drug use: No   Sexual activity: Not Currently    Birth control/protection: Post-menopausal    Comment: First IC @ 20, More than 5 partners, Hx of CT+

## 2024-02-24 NOTE — Progress Notes (Signed)
Pharmacy did receive order.

## 2024-02-24 NOTE — Progress Notes (Signed)
 Chief Complaint  Patient presents with   Foot Problem    Rm 9 Patient is her multiple foot problems. Rt ft- Callus on 3rd toe, nerve pain on dorsal aspect of right foot. Lt ft- callus on 3rd toe and all toes are tender and sore to the touch. Patient requested no trimming of corns.    HPI: 63 y.o. 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   Paresthesia 12/20/2010   Mild   Phlebitis, complicating pregnancy or puerperium    Presence of IVC filter    Rectal pain, chronic 08/11/2012   4/14    Vertigo     Past Surgical History:  Procedure Laterality Date   CESAREAN SECTION     ENDOMETRIAL ABLATION     HERNIA REPAIR     KNEE ARTHROSCOPY WITH LATERAL MENISECTOMY Right 12/03/2023   Procedure: ARTHROSCOPY, KNEE, WITH PARTIAL LATERAL MENISCECTOMY;  Surgeon: Vernetta Lonni GRADE, MD;  Location: [facility];  Service: Orthopedics;  Laterality: Right;    Allergies  Allergen Reactions   Blueberry [Vaccinium Angustifolium] Anaphylaxis   Grapeseed Extract [Nutritional Supplements] Anaphylaxis    Reaction to muscadines    Pineapple Anaphylaxis   Lyrica [Pregabalin] Swelling    Body and throat swell   Oxycodone  Itching and Nausea And Vomiting   Topiramate     REACTION: confusion-pt thinks 100mg  made her confused   Hydrocodone -Acetaminophen  Rash and Hives     Physical Exam: General: The patient is alert and oriented x3 in no acute  distress.  Dermatology: Skin is warm, dry and supple bilateral lower extremities. Hyperkeratotic dystrophic nails noted 1-5 bilateral with diffuse hyperkeratosis of skin to the weightbearing surface of the bilateral feet  Vascular: Palpable pedal pulses bilaterally. Capillary refill within normal limits.  No appreciable edema.  No erythema.    Neurological: Grossly intact via light touch  Musculoskeletal Exam: No pedal deformities noted  Radiographic Exam B/L feet 02/22/2024:  Normal osseous mineralization. Joint spaces preserved.  No fractures or osseous irregularities noted.  Assessment/Plan of Care: 1.  Symptomatic calluses bilateral feet secondary to barefoot walking 2.  Chronic tinea pedis bilateral feet 3.  Fungal nail infection bilateral  -Patient evaluated.  X-rays reviewed -Refrain from going barefoot.  Recommend good supportive shoes and sneakers or slides that provide arch support even around the house -Today we discussed fungal nail infection and different treatment modalities including oral, topical, and laser antifungal treatment modalities.  Relative efficacy's as well as risks and benefits associated with each modality were explained.  Patient opts for oral Lamisil -Prescription for Lamisil 250 mg #90 daily.  Patient has never had a history of liver pathology or symptoms -Prescription for Lotrisone cream apply twice daily -Return to clinic PRN       Thresa EMERSON Sar, DPM [facility]  Dr. Thresa EMERSON Sar, DPM    [address]  [place], [place] [address]                Office [phone]  Fax [phone]

## 2024-02-24 NOTE — Addendum Note (Signed)
 Addended by: GRETTA FORTE on: 02/24/2024 12:09 PM   Modules accepted: Orders

## 2024-02-25 ENCOUNTER — Encounter

## 2024-02-25 ENCOUNTER — Telehealth: Payer: Self-pay | Admitting: Physician Assistant

## 2024-02-25 ENCOUNTER — Other Ambulatory Visit: Payer: Self-pay | Admitting: Family

## 2024-02-25 DIAGNOSIS — M797 Fibromyalgia: Secondary | ICD-10-CM

## 2024-02-25 NOTE — Telephone Encounter (Signed)
 Patient aware this is ready at pharmacy for her

## 2024-02-25 NOTE — Telephone Encounter (Signed)
 Pt called stating that she went to [facility] and the Prednisone  wasn't listed to be filled and it is one of the main ones that she needs. Pt call back number is [phone]

## 2024-03-14 ENCOUNTER — Encounter: Payer: Self-pay | Admitting: Radiology

## 2024-05-04 ENCOUNTER — Ambulatory Visit: Admitting: Orthopaedic Surgery

## 2024-05-20 ENCOUNTER — Ambulatory Visit: Admitting: Family

## 2024-05-20 ENCOUNTER — Encounter: Payer: Self-pay | Admitting: Family

## 2024-05-20 VITALS — BP 138/80 | HR 79 | Temp 97.7°F | Ht 62.0 in | Wt 135.2 lb

## 2024-05-20 DIAGNOSIS — N393 Stress incontinence (female) (male): Secondary | ICD-10-CM

## 2024-05-20 DIAGNOSIS — R350 Frequency of micturition: Secondary | ICD-10-CM

## 2024-05-20 LAB — POCT URINALYSIS DIPSTICK
Bilirubin, UA: NEGATIVE
Blood, UA: NEGATIVE
Glucose, UA: NEGATIVE
Ketones, UA: NEGATIVE
Leukocytes, UA: NEGATIVE
Nitrite, UA: NEGATIVE
Protein, UA: NEGATIVE
Spec Grav, UA: 1.025
Urobilinogen, UA: 0.2 U/dL
pH, UA: 6

## 2024-05-20 NOTE — Progress Notes (Signed)
 "  Patient ID: Bailey Patterson, female    DOB: [birth year], 64 y.o.   MRN: [id number]  Chief Complaint  Patient presents with   Urinary Frequency    Pt stated that she has had some freq urination for the past month. She stated that when she coughs she can not hold it like she use too  Discussed the use of AI scribe software for clinical note transcription with the patient, who gave verbal consent to proceed.  History of Present Illness Bailey Patterson is a 64 year old female who presents with urinary incontinence.  She has stress urinary incontinence with urine leakage during sneezing or coughing. Pelvic floor exercises have not improved symptoms. She has no dysuria, cloudy urine, or other urinary tract infection symptoms, and a recent urinalysis was negative. She drinks about two and a half bottles of water daily and plans to increase to four or five, and she uses caffeine regularly. She also has heart palpitations that she associates with caffeine and stress and wants to reduce caffeine and increase water intake.   Assessment & Plan Stress incontinence Likely due to weakened pelvic floor muscles. No urinary tract infection. Discussed Kegel exercises and potential medication options including oxybutynin and Myrbetriq. - Perform Kegel exercises regularly, handout provided. - Consider oxybutynin if exercises are ineffective. - Use urinary pads as needed. - F/U prn  Urinary frequency No infection, UA neg. Discussed hydration and caffeine reduction. Discussed starting Oxybutynin for 30d then get PA for Myrbetriq - generic should be available. - Increase water intake. - Reduce caffeine consumption. - F/U prn  General health maintenance - Due for annual physical with fasting labs, can schedule today.  Subjective:    Outpatient Medications Prior to Visit  Medication Sig Dispense Refill   clotrimazole -betamethasone  (LOTRISONE ) cream Apply 1 Application topically daily. 45 g 2    HYDROcodone -acetaminophen  (NORCO/VICODIN) 5-325 MG tablet Take 1-2 tablets by mouth every 6 (six) hours as needed for moderate pain (pain score 4-6). 30 tablet 0   ibuprofen  (ADVIL ) 800 MG tablet Take 1 tablet (800 mg total) by mouth every 8 (eight) hours as needed. 60 tablet 3   methocarbamol  (ROBAXIN ) 500 MG tablet Take 1 tablet (500 mg total) by mouth 4 (four) times daily. 30 tablet 0   methocarbamol  (ROBAXIN ) 500 MG tablet Take 1 tablet (500 mg total) by mouth at bedtime as needed for muscle spasms. 30 tablet 1   methylPREDNISolone  (MEDROL ) 4 MG tablet Take as directed Day 1 3 tablets in the morning 3 tablets in the evening day 2 3 tablets in the morning 2 tablets in the evening day 3 2 tablets in the morning 2 tablets in the evening, day 4  2 tablets in the morning 2 tablets in the evening, day 5 2 tablets in the morning, daily six 1 tablet in the morning 21 tablet 0   naproxen  (NAPROSYN ) 500 MG tablet Take 1 tablet (500 mg total) by mouth 2 (two) times daily as needed for moderate pain (pain score 4-6) or headache (For migraine or other body pain.). Take with 1 Imitrex  (sumatriptan ) for migraine or muscle spasm pain. 60 tablet 2   omeprazole  (PRILOSEC) 20 MG capsule TAKE 1 CAPSULE BY MOUTH UP TO TWICE A DAY FOR 1 WEEK, THEN REDUCE TO DAILY. 90 capsule 1   ondansetron  (ZOFRAN -ODT) 4 MG disintegrating tablet Take 1 tablet (4 mg total) by mouth every 8 (eight) hours as needed for nausea or vomiting. 20 tablet 0   SUMAtriptan  (IMITREX ) 50  MG tablet Take 1 tablet (50 mg total) by mouth every 2 (two) hours as needed for migraine (Max dose of 4 pills in 24 hours). Take first dose with 1 Naproxen . 20 tablet 2   terbinafine  (LAMISIL ) 250 MG tablet Take 1 tablet (250 mg total) by mouth daily. 90 tablet 0   No facility-administered medications prior to visit.   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   Paresthesia 12/20/2010   Mild   Phlebitis, complicating pregnancy or puerperium    Presence of IVC filter    Rectal pain, chronic 08/11/2012   4/14    Vertigo    Past Surgical History:  Procedure Laterality Date   CESAREAN SECTION     ENDOMETRIAL ABLATION     HERNIA REPAIR     KNEE ARTHROSCOPY WITH LATERAL MENISECTOMY Right 12/03/2023   Procedure: ARTHROSCOPY, KNEE, WITH PARTIAL LATERAL MENISCECTOMY;  Surgeon: Vernetta Lonni GRADE, MD;  Location: [facility];  Service: Orthopedics;  Laterality: Right;   Allergies[1]    Objective:    Physical Exam Vitals and nursing note reviewed.  Constitutional:      Appearance: Normal appearance.  Cardiovascular:     Rate and Rhythm: Normal rate and regular rhythm.  Pulmonary:     Effort: Pulmonary effort is normal.     Breath sounds: Normal breath sounds.  Musculoskeletal:        General: Normal range of motion.  Skin:    General: Skin is warm and dry.  Neurological:     Mental Status: She is alert.  Psychiatric:        Mood and Affect: Mood normal.        Behavior: Behavior normal.    BP (!) 147/83   Pulse 79   Temp 97.7 F (36.5 C)   Ht 5' 2 (1.575 m)   Wt 135 lb 3.2 oz (61.3 kg)   SpO2 100%   BMI 24.73 kg/m  Wt Readings from Last 3 Encounters:  05/20/24 135 lb 3.2 oz (61.3 kg)  02/22/24 131 lb 9.6 oz (59.7 kg)  01/15/24 131 lb 9.6 oz (59.7 kg)      [name], NP     [1]  Allergies Allergen Reactions   Blueberry [Vaccinium Angustifolium] Anaphylaxis   Grapeseed Extract [Nutritional Supplements] Anaphylaxis     Reaction to muscadines    Pineapple Anaphylaxis   Lyrica [Pregabalin] Swelling    Body and throat swell   Oxycodone  Itching and Nausea And Vomiting   Topiramate     REACTION: confusion-pt thinks 100mg  made her confused   Hydrocodone -Acetaminophen  Rash and Hives   "

## 2024-05-30 ENCOUNTER — Emergency Department (HOSPITAL_COMMUNITY)

## 2024-05-30 ENCOUNTER — Other Ambulatory Visit: Payer: Self-pay

## 2024-05-30 ENCOUNTER — Encounter (HOSPITAL_COMMUNITY): Payer: Self-pay

## 2024-05-30 ENCOUNTER — Emergency Department (HOSPITAL_COMMUNITY)
Admission: EM | Admit: 2024-05-30 | Discharge: 2024-05-30 | Disposition: A | Discharge status: Home / Self Care | Attending: Emergency Medicine | Admitting: Emergency Medicine

## 2024-05-30 ENCOUNTER — Ambulatory Visit: Payer: Self-pay

## 2024-05-30 DIAGNOSIS — R918 Other nonspecific abnormal finding of lung field: Secondary | ICD-10-CM

## 2024-05-30 DIAGNOSIS — R1084 Generalized abdominal pain: Secondary | ICD-10-CM | POA: Diagnosis not present

## 2024-05-30 DIAGNOSIS — M545 Low back pain, unspecified: Secondary | ICD-10-CM | POA: Diagnosis not present

## 2024-05-30 DIAGNOSIS — R140 Abdominal distension (gaseous): Secondary | ICD-10-CM | POA: Insufficient documentation

## 2024-05-30 DIAGNOSIS — K859 Acute pancreatitis without necrosis or infection, unspecified: Secondary | ICD-10-CM

## 2024-05-30 DIAGNOSIS — R109 Unspecified abdominal pain: Secondary | ICD-10-CM | POA: Diagnosis present

## 2024-05-30 LAB — COMPREHENSIVE METABOLIC PANEL WITH GFR
ALT: 22 U/L (ref 0–44)
AST: 35 U/L (ref 15–41)
Albumin: 3.9 g/dL (ref 3.5–5.0)
Alkaline Phosphatase: 85 U/L (ref 38–126)
Anion gap: 10 (ref 5–15)
BUN: 10 mg/dL (ref 8–23)
CO2: 25 mmol/L (ref 22–32)
Calcium: 8.9 mg/dL (ref 8.9–10.3)
Chloride: 105 mmol/L (ref 98–111)
Creatinine, Ser: 0.71 mg/dL (ref 0.44–1.00)
GFR, Estimated: >60 mL/min
Glucose, Bld: 83 mg/dL (ref 70–99)
Potassium: 4 mmol/L (ref 3.5–5.1)
Sodium: 140 mmol/L (ref 135–145)
Total Bilirubin: 0.6 mg/dL (ref 0.0–1.2)
Total Protein: 6.8 g/dL (ref 6.5–8.1)

## 2024-05-30 LAB — CBC WITH DIFFERENTIAL/PLATELET
Abs Immature Granulocytes: 0.04 K/uL (ref 0.00–0.07)
Basophils Absolute: 0 K/uL (ref 0.0–0.1)
Basophils Relative: 1 %
Eosinophils Absolute: 0 K/uL (ref 0.0–0.5)
Eosinophils Relative: 1 %
HCT: 45.5 % (ref 36.0–46.0)
Hemoglobin: 14.9 g/dL (ref 12.0–15.0)
Immature Granulocytes: 1 %
Lymphocytes Relative: 23 %
Lymphs Abs: 1.3 K/uL (ref 0.7–4.0)
MCH: 29.3 pg (ref 26.0–34.0)
MCHC: 32.7 g/dL (ref 30.0–36.0)
MCV: 89.6 fL (ref 80.0–100.0)
Monocytes Absolute: 0.4 K/uL (ref 0.1–1.0)
Monocytes Relative: 7 %
Neutro Abs: 3.9 K/uL (ref 1.7–7.7)
Neutrophils Relative %: 67 %
Platelets: 328 K/uL (ref 150–400)
RBC: 5.08 MIL/uL (ref 3.87–5.11)
RDW: 13.6 % (ref 11.5–15.5)
WBC: 5.7 K/uL (ref 4.0–10.5)
nRBC: 0 % (ref 0.0–0.2)

## 2024-05-30 LAB — I-STAT CHEM 8, ED
BUN: 9 mg/dL (ref 8–23)
Calcium, Ion: 0.82 mmol/L — CL (ref 1.15–1.40)
Chloride: 115 mmol/L — ABNORMAL HIGH (ref 98–111)
Creatinine, Ser: 0.4 mg/dL — ABNORMAL LOW (ref 0.44–1.00)
Glucose, Bld: 67 mg/dL — ABNORMAL LOW (ref 70–99)
HCT: 36 % (ref 36.0–46.0)
Hemoglobin: 12.2 g/dL (ref 12.0–15.0)
Potassium: 5.5 mmol/L — ABNORMAL HIGH (ref 3.5–5.1)
Sodium: 142 mmol/L (ref 135–145)
TCO2: 19 mmol/L — ABNORMAL LOW (ref 22–32)

## 2024-05-30 LAB — URINALYSIS, ROUTINE W REFLEX MICROSCOPIC
Bacteria, UA: NONE SEEN
Bilirubin Urine: NEGATIVE
Glucose, UA: NEGATIVE mg/dL
Ketones, ur: NEGATIVE mg/dL
Leukocytes,Ua: NEGATIVE
Nitrite: NEGATIVE
Protein, ur: NEGATIVE mg/dL
Specific Gravity, Urine: 1.015 (ref 1.005–1.030)
pH: 5 (ref 5.0–8.0)

## 2024-05-30 LAB — LIPASE, BLOOD: Lipase: 1028 U/L — ABNORMAL HIGH (ref 11–51)

## 2024-05-30 MED ORDER — MORPHINE SULFATE (PF) 2 MG/ML IV SOLN
2.0000 mg | Freq: Once | INTRAVENOUS | Status: AC
  Administered 2024-05-30: 2 mg via INTRAVENOUS
  Filled 2024-05-30: qty 1

## 2024-05-30 MED ORDER — IOHEXOL 350 MG/ML SOLN
60.0000 mL | Freq: Once | INTRAVENOUS | Status: AC | PRN
  Administered 2024-05-30: 60 mL via INTRAVENOUS

## 2024-05-30 MED ORDER — TRAMADOL HCL 50 MG PO TABS
50.0000 mg | ORAL_TABLET | Freq: Once | ORAL | Status: AC
  Administered 2024-05-30: 50 mg via ORAL
  Filled 2024-05-30: qty 1

## 2024-05-30 MED ORDER — TRAMADOL HCL 50 MG PO TABS: 50.0000 mg | ORAL_TABLET | Freq: Four times a day (QID) | ORAL | 0 refills | Status: AC | PRN

## 2024-05-30 MED ORDER — IOHEXOL 350 MG/ML SOLN: 50.0000 mL | Freq: Once | INTRAVENOUS | Status: DC | PRN

## 2024-05-30 NOTE — Telephone Encounter (Signed)
 FYI Only or Action Required?: FYI only for provider: ED advised.  Patient was last seen in primary care on 05/20/2024 by Lucius Krabbe, NP.  Called Nurse Triage reporting Abdominal Pain.  Symptoms began two weeks ago and getting worse.  Interventions attempted: OTC medications: Gas-X, suppository and Rest, hydration, or home remedies.  Symptoms are: gradually worsening.  Triage Disposition: Go to ED Now (Notify PCP)  Patient/caregiver understands and will follow disposition?: Yes             Message from [facility] sent at 05/30/2024  7:44 AM EST  Reason for Triage: Pain in stomach and lower back, lactose intolerant and was drinking Ensure thinks that's what did it. Pain is 8-9   Reason for Disposition  [1] SEVERE pain AND [2] age > 60 years  Answer Assessment - Initial Assessment Questions Patient states that her abdomen is cramping and her lower back is aching. She also states her abdomen is very bloated  Patient states she has been drinking Ensure the past two weeks and she states she is lactose intolerant. Patient states for the past two weeks she has been constipated. Patient has been drinking water and states that she did get some Gas-X and a suppository. She took one suppository around 5am this morning. She has been having gas off and on She also took two Gas-X at that time. Patient states 3 weeks ago was her last bowel movement. Patient states constipated small hard bowel movements this week. Patient denies seeing any blood in her bowel movements or vomiting blood. She did endorse some nausea. Lower back pain 8-9 out of 10. Patient states the pain is severe and hurting into her lower back and patient sounds unwell.  Patient is advised that with her symptoms it is recommended that she goes to the Emergency Room. Patient states she will do that and she states she will call someone to take her to the ER. Patient is advised that Patient is advised to call us   back if anything changes or with any further questions/concerns. Patient is advised that if anything worsens to call 911. Patient verbalized understanding.  Protocols used: Abdominal Pain - Female-A-AH

## 2024-05-30 NOTE — ED Provider Triage Note (Signed)
 Emergency Medicine Provider Triage Evaluation Note  Bailey Patterson , a 65 y.o. female  was evaluated in triage.  Pt complains of right flank/low back pain radiating into abdomen with crampy pain.  She has not had any recent similar symptoms in the past.  Her chart lists a history of constipation but she states that that has not been a significant problem.  Use Gas-X and a suppository.  She has not vomited or had diarrhea..  Review of Systems  Positive: Flank pain abdominal pain Negative: Urinary symptoms, hematuria  Physical Exam  BP 128/86 (BP Location: Right Arm)   Pulse 82   Temp 98 F (36.7 C) (Oral)   Resp 18   Ht 1.6 m (5' 3)   Wt 59.9 kg   SpO2 100%   BMI 23.38 kg/m  Gen:   Awake, no distress   Resp:  Normal effort  MSK:   Moves extremities without difficulty  Other:  Diffuse abdominal tenderness to palpation no rebound  Medical Decision Making  Medically screening exam initiated at 10:24 AM.  Appropriate orders placed.  Bailey Patterson was informed that the remainder of the evaluation will be completed by another provider, this initial triage assessment does not replace that evaluation, and the importance of remaining in the ED until their evaluation is complete.  64 year old female presents today with right flank pain and abdominal pain.  Will place orders for labs, imaging, urinalysis.  Patient advised this is a screening exam and she should remain for the rest of her full evaluation.  She voices understanding.   Bailey Houston, MD 05/30/24 1029

## 2024-05-30 NOTE — ED Triage Notes (Signed)
 Pt came in via POV d/t lower back & abd pain that started around midnight. States that she is lactose intolerant & drank ensure when this pain started & it has continued. A/Ox4, rates her pain 8/10 during triage after endorsing she took some gas-x that did relieve some of the abd pressure.

## 2024-05-30 NOTE — Discharge Instructions (Signed)
 Thank you for letting us  take care of you today.  You came in today for evaluation of abdominal pain.  We did do laboratory testing that showed evidence of pancreatitis.  Weakness was causing abdominal pain.  We will send you home with pain medication to use as needed.  Please keep a bland diet over the next several days.  You may slowly start to progress her diet after that once your symptoms are improved.  Please also follow-up with your primary care doctor.  We also did CT scans here that incidentally found a mass in your lung as well as masses in her liver.  Although we are not entirely sure what this is there is a suspicion for potential cancer.  We will have you follow-up with the oncology doctors.  Please call them as soon as possible for establishing an appointment.

## 2024-05-30 NOTE — ED Notes (Signed)
 Patient transported to CT

## 2024-05-30 NOTE — Telephone Encounter (Signed)
 noted

## 2024-05-30 NOTE — ED Provider Notes (Signed)
 " [facility] Provider Note   CSN: [id number] Arrival date & time: 05/30/24  0941     Patient presents with: Abdominal Pain and Back Pain   Bailey Patterson is a 64 y.o. female.   Bailey Patterson is a 64 y.o. female presenting with new onset abdominal pain that radiates to her back.   Abdominal pain started overnight and was severe enough to wake her from sleep. Pain is in the lower abdomen with radiation to the lower back. She describes her abdomen as puffy, tight, and swollen, with associated back cramping.  She has chronic constipation with small, hard stools. A suppository gave little relief. She reports increased gas after drinking Ensure despite known lactose intolerance.  She has nausea without vomiting and no current diarrhea, fever, or chills.  Past medical problems include asthma, GERD, and fibromyalgia. Current medications include Norco, Advil , Robaxin , prednisone , Prilosec, Imitrex , and Zofran .   Abdominal Pain Associated symptoms: no chills, no dysuria and no fever   Back Pain Associated symptoms: abdominal pain   Associated symptoms: no dysuria and no fever        Prior to Admission medications  Medication Sig Start Date End Date Taking? Authorizing Provider  clotrimazole -betamethasone  (LOTRISONE ) cream Apply 1 Application topically daily. 02/22/24   Janit Thresa HERO, DPM  HYDROcodone -acetaminophen  (NORCO/VICODIN) 5-325 MG tablet Take 1-2 tablets by mouth every 6 (six) hours as needed for moderate pain (pain score 4-6). 12/03/23   Vernetta Lonni GRADE, MD  ibuprofen  (ADVIL ) 800 MG tablet Take 1 tablet (800 mg total) by mouth every 8 (eight) hours as needed. 12/07/23   Vernetta Lonni GRADE, MD  methocarbamol  (ROBAXIN ) 500 MG tablet Take 1 tablet (500 mg total) by mouth 4 (four) times daily. 12/03/23   Vernetta Lonni GRADE, MD  methocarbamol  (ROBAXIN ) 500 MG tablet Take 1 tablet (500 mg total) by mouth at bedtime as  needed for muscle spasms. 02/24/24   Gretta Bertrum ORN, PA-C  methylPREDNISolone  (MEDROL ) 4 MG tablet Take as directed Day 1 3 tablets in the morning 3 tablets in the evening day 2 3 tablets in the morning 2 tablets in the evening day 3 2 tablets in the morning 2 tablets in the evening, day 4  2 tablets in the morning 2 tablets in the evening, day 5 2 tablets in the morning, daily six 1 tablet in the morning 02/24/24   Gretta Bertrum ORN, PA-C  naproxen  (NAPROSYN ) 500 MG tablet Take 1 tablet (500 mg total) by mouth 2 (two) times daily as needed for moderate pain (pain score 4-6) or headache (For migraine or other body pain.). Take with 1 Imitrex  (sumatriptan ) for migraine or muscle spasm pain. 07/24/23   Lucius Krabbe, NP  omeprazole  (PRILOSEC) 20 MG capsule TAKE 1 CAPSULE BY MOUTH UP TO TWICE A DAY FOR 1 WEEK, THEN REDUCE TO DAILY. 10/19/23   Lucius Krabbe, NP  ondansetron  (ZOFRAN -ODT) 4 MG disintegrating tablet Take 1 tablet (4 mg total) by mouth every 8 (eight) hours as needed for nausea or vomiting. 12/03/23   Vernetta Lonni GRADE, MD  SUMAtriptan  (IMITREX ) 50 MG tablet Take 1 tablet (50 mg total) by mouth every 2 (two) hours as needed for migraine (Max dose of 4 pills in 24 hours). Take first dose with 1 Naproxen . 07/24/23   Lucius Krabbe, NP  terbinafine  (LAMISIL ) 250 MG tablet Take 1 tablet (250 mg total) by mouth daily. 02/22/24   Janit Thresa HERO, DPM    Allergies: Blueberry [vaccinium  angustifolium], Grapeseed extract [nutritional supplements], Pineapple, Lactose intolerance (gi), Lyrica [pregabalin], Oxycodone , Topiramate, and Hydrocodone -acetaminophen     Review of Systems  Constitutional:  Negative for appetite change, chills and fever.  Gastrointestinal:  Positive for abdominal pain.  Genitourinary:  Negative for dysuria.  Musculoskeletal:  Positive for back pain.    Updated Vital Signs BP 135/77   Pulse 76   Temp 98.3 F (36.8 C)   Resp 18   Ht 5' 3 (1.6 m)   Wt 59.9  kg   SpO2 100%   BMI 23.38 kg/m   Physical Exam Constitutional:      General: She is not in acute distress.    Appearance: She is well-developed and normal weight. She is not ill-appearing.  Cardiovascular:     Rate and Rhythm: Normal rate and regular rhythm.     Heart sounds: Normal heart sounds. No murmur heard. Pulmonary:     Effort: Pulmonary effort is normal. No respiratory distress.     Breath sounds: Normal breath sounds.  Abdominal:     General: Abdomen is flat. There is distension.     Palpations: Abdomen is soft.     Tenderness: There is abdominal tenderness in the epigastric area.     Hernia: No hernia is present.     Comments: Mild distention  Ventral hernia repair scar noted   Skin:    General: Skin is warm.     Capillary Refill: Capillary refill takes less than 2 seconds.  Neurological:     General: No focal deficit present.     Mental Status: She is alert and oriented to person, place, and time.     Motor: No weakness.     (all labs ordered are listed, but only abnormal results are displayed) Labs Reviewed  URINALYSIS, ROUTINE W REFLEX MICROSCOPIC - Abnormal; Notable for the following components:      Result Value   Hgb urine dipstick SMALL (*)    All other components within normal limits  CBC WITH DIFFERENTIAL/PLATELET  COMPREHENSIVE METABOLIC PANEL WITH GFR  LIPASE, BLOOD  I-STAT CHEM 8, ED    EKG: None  Radiology: No results found.   Procedures   Medications Ordered in the ED  morphine  (PF) 2 MG/ML injection 2 mg (2 mg Intravenous Given 05/30/24 1225)  morphine  (PF) 2 MG/ML injection 2 mg (2 mg Intravenous Given 05/30/24 1445)    Clinical Course as of 05/30/24 1500  Mon May 30, 2024  1459 S - Abdominal pain and distention with radiation to lower back. Non obstructed, radiates to back.  [DZ]    Clinical Course User Index [DZ] Bailey Sieving, MD                                 Medical Decision Making [name] is a 64 y.o. female  presenting with new onset abdominal pain. On exam she is well appearing with mild distention in her abdomen. History notable for 2 hernia repairs. Differential includes chronic constipation, SBO, GERD. Less likely to be SBO as she is passing gas and tolerating PO. She is afebrile without a WBC low suspicion for intraabdominal infection.   She received morphine  2 mg for pain. Labs were recollected x3 due to hemolysis. CT AP w con pending lab results. ISTAT ordered and pending. If abdomen is benign on imaging, plan to discharge for outpatient follow up if as patient has been tolerating PO.   Risk Prescription  drug management.       Final diagnoses:  Generalized abdominal pain    ED Discharge Orders     None          Cleotilde Perkins, DO 05/30/24 1500  "

## 2024-05-31 ENCOUNTER — Telehealth: Payer: Self-pay

## 2024-05-31 DIAGNOSIS — R918 Other nonspecific abnormal finding of lung field: Secondary | ICD-10-CM

## 2024-05-31 DIAGNOSIS — R160 Hepatomegaly, not elsewhere classified: Secondary | ICD-10-CM

## 2024-05-31 NOTE — Telephone Encounter (Signed)
 Please advise.

## 2024-05-31 NOTE — Telephone Encounter (Signed)
 Copied from CRM [id number]. Topic: Referral - Request for Referral >> May 31, 2024  9:45 AM [name] wrote: Did the patient discuss referral with their provider in the last year? Yes (If No - schedule appointment) (If Yes - send message)  Appointment offered? Yes  Type of order/referral and detailed reason for visit: oncologist, pt was in ED yesterday and they recommended a referral to oncology  Preference of office, provider, location:  [facility] [phone], fax- [phone]  If referral order, have you been seen by this specialty before? No (If Yes, this issue or another issue? When? Where?  Can we respond through MyChart? No

## 2024-06-01 NOTE — Telephone Encounter (Signed)
 Referral placed, I reached out to patient and left a voicemail.

## 2024-06-01 NOTE — Addendum Note (Signed)
 Addended by: NEYSA CLARIA SAILOR on: 06/01/2024 02:50 PM   Modules accepted: Orders

## 2024-06-01 NOTE — Telephone Encounter (Signed)
"  Patient returned call and verbalized understanding.  "

## 2024-06-01 NOTE — Telephone Encounter (Unsigned)
 Copied from CRM #[id number]. Topic: General - Other >> Jun 01, 2024  8:45 AM Wess RAMAN wrote: Reason for CRM: Patient would like a call from Lucius Krabbe, NP to discuss her status of oncology referral  Callback #: [phone]

## 2024-06-01 NOTE — Telephone Encounter (Signed)
 Please send referral to oncology with DX lung mass and liver mass - but Let her know very sorry to hear this news -  on her ED discharge paper there should be a phone number to call Dr Pauletta Chihuahua with [facility] oncology. Let her know I will follow and contact me if she needs anything.

## 2024-06-01 NOTE — Telephone Encounter (Signed)
 Pt has called 2x asking for an update on this referral. Please advise

## 2024-06-08 ENCOUNTER — Other Ambulatory Visit: Payer: Self-pay | Admitting: Medical Oncology

## 2024-06-08 DIAGNOSIS — R918 Other nonspecific abnormal finding of lung field: Secondary | ICD-10-CM

## 2024-06-09 ENCOUNTER — Inpatient Hospital Stay

## 2024-06-09 ENCOUNTER — Inpatient Hospital Stay: Attending: Internal Medicine | Admitting: Internal Medicine

## 2024-06-09 VITALS — BP 146/79 | HR 84 | Temp 97.8°F | Resp 17 | Ht 63.0 in | Wt 134.0 lb

## 2024-06-09 DIAGNOSIS — C3490 Malignant neoplasm of unspecified part of unspecified bronchus or lung: Secondary | ICD-10-CM

## 2024-06-09 DIAGNOSIS — R918 Other nonspecific abnormal finding of lung field: Secondary | ICD-10-CM

## 2024-06-09 LAB — CMP (CANCER CENTER ONLY)
ALT: 41 U/L (ref 0–44)
AST: 27 U/L (ref 15–41)
Albumin: 4.2 g/dL (ref 3.5–5.0)
Alkaline Phosphatase: 111 U/L (ref 38–126)
Anion gap: 10 (ref 5–15)
BUN: 14 mg/dL (ref 8–23)
CO2: 24 mmol/L (ref 22–32)
Calcium: 10.4 mg/dL — ABNORMAL HIGH (ref 8.9–10.3)
Chloride: 108 mmol/L (ref 98–111)
Creatinine: 0.75 mg/dL (ref 0.44–1.00)
GFR, Estimated: >60 mL/min
Glucose, Bld: 95 mg/dL (ref 70–99)
Potassium: 4 mmol/L (ref 3.5–5.1)
Sodium: 142 mmol/L (ref 135–145)
Total Bilirubin: 0.4 mg/dL (ref 0.0–1.2)
Total Protein: 7.5 g/dL (ref 6.5–8.1)

## 2024-06-09 LAB — CBC WITH DIFFERENTIAL (CANCER CENTER ONLY)
Abs Immature Granulocytes: 0.03 10*3/uL (ref 0.00–0.07)
Basophils Absolute: 0 10*3/uL (ref 0.0–0.1)
Basophils Relative: 0 %
Eosinophils Absolute: 0 10*3/uL (ref 0.0–0.5)
Eosinophils Relative: 1 %
HCT: 40.5 % (ref 36.0–46.0)
Hemoglobin: 13.5 g/dL (ref 12.0–15.0)
Immature Granulocytes: 1 %
Lymphocytes Relative: 32 %
Lymphs Abs: 1.7 10*3/uL (ref 0.7–4.0)
MCH: 28.6 pg (ref 26.0–34.0)
MCHC: 33.3 g/dL (ref 30.0–36.0)
MCV: 85.8 fL (ref 80.0–100.0)
Monocytes Absolute: 0.5 10*3/uL (ref 0.1–1.0)
Monocytes Relative: 9 %
Neutro Abs: 3.1 10*3/uL (ref 1.7–7.7)
Neutrophils Relative %: 57 %
Platelet Count: 367 10*3/uL (ref 150–400)
RBC: 4.72 MIL/uL (ref 3.87–5.11)
RDW: 13.7 % (ref 11.5–15.5)
WBC Count: 5.3 10*3/uL (ref 4.0–10.5)
nRBC: 0 % (ref 0.0–0.2)

## 2024-06-09 NOTE — Progress Notes (Signed)
 "   [facility] Telephone:([phone]   Fax:([phone]) [phone]  CONSULT NOTE  REFERRING PHYSICIAN: Corean Comment, NP  REASON FOR CONSULTATION:  64 years old female with suspicious lung cancer  HPI [facility] is a 64 y.o. female.   HPI  Discussed the use of AI scribe software for clinical note transcription with the patient, who gave verbal consent to proceed.  History of Present Illness Bailey Patterson is a 64 year old woman who presents for initial oncology evaluation of a right lower lobe lung mass with multiple hepatic lesions suspicious for metastatic malignancy.  She initially presented on May 30, 2024, with abdominal pain and was found on imaging to have a 4.2 cm medial right lower lobe lung mass and multiple hepatic lesions, the largest measuring 2.5 cm.  She currently experiences persistent chest symptoms, including a sensation of suffocation, midline chest pain, and intermittent dyspnea at rest. She reports increased exertional fatigue and a dry, hoarse cough productive of yellowish-white sputum, without hemoptysis. She denies unintentional weight loss, with weight stable at 132-134 lbs. She endorses intermittent nausea and infrequent diarrhea without hematochezia.  Her medical history is notable for fibromyalgia with associated neuropathic pain, anxiety, vertigo, migraine headaches, and a remote deep vein thrombosis treated with an IVC filter. She has never smoked, rarely consumes alcohol, and denies illicit drug use. Family history significant for mother with diabetes mellitus, heart disease, kidney disease, hypertension, stroke. Father had a history of prostate and lung cancer. The patient is single and has 5 daughters.  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  She has no history of smoking and drinks alcohol occasionally and no history of drug abuse.    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   Paresthesia 12/20/2010   Mild   Phlebitis, complicating pregnancy or puerperium    Presence of IVC filter    Rectal pain, chronic 08/11/2012   4/14    Vertigo       Past Surgical History:  Procedure Laterality Date   CESAREAN SECTION     ENDOMETRIAL ABLATION     HERNIA REPAIR     KNEE ARTHROSCOPY WITH LATERAL MENISECTOMY Right 12/03/2023   Procedure: ARTHROSCOPY, KNEE, WITH PARTIAL LATERAL MENISCECTOMY;  Surgeon: Vernetta Lonni GRADE, MD;  Location: [facility];  Service: Orthopedics;  Laterality: Right;    Family History  Problem Relation Age of Onset   Diabetes Mother    Heart disease Mother    Kidney disease Mother    Hypertension Mother    Stroke Mother    Cervical cancer Mother    Diabetes Other    Hypertension Other    Kidney disease Other    Cancer Father     Social History Social History[1]  Allergies[2]  Current Outpatient Medications  Medication Sig Dispense Refill   clotrimazole -betamethasone  (LOTRISONE ) cream Apply 1 Application topically daily. 45 g 2   HYDROcodone -acetaminophen  (NORCO/VICODIN) 5-325 MG tablet Take 1-2 tablets by mouth  every 6 (six) hours as needed for moderate pain (pain score 4-6). 30 tablet 0   ibuprofen  (ADVIL ) 800 MG tablet Take 1 tablet (800 mg total) by mouth every 8 (eight) hours as needed. 60 tablet 3   methocarbamol  (ROBAXIN ) 500 MG tablet Take 1 tablet (500 mg total) by mouth 4 (four) times daily. 30 tablet 0   methocarbamol  (ROBAXIN ) 500 MG tablet Take 1  tablet (500 mg total) by mouth at bedtime as needed for muscle spasms. 30 tablet 1   methylPREDNISolone  (MEDROL ) 4 MG tablet Take as directed Day 1 3 tablets in the morning 3 tablets in the evening day 2 3 tablets in the morning 2 tablets in the evening day 3 2 tablets in the morning 2 tablets in the evening, day 4  2 tablets in the morning 2 tablets in the evening, day 5 2 tablets in the morning, daily six 1 tablet in the morning 21 tablet 0   naproxen  (NAPROSYN ) 500 MG tablet Take 1 tablet (500 mg total) by mouth 2 (two) times daily as needed for moderate pain (pain score 4-6) or headache (For migraine or other body pain.). Take with 1 Imitrex  (sumatriptan ) for migraine or muscle spasm pain. 60 tablet 2   omeprazole  (PRILOSEC) 20 MG capsule TAKE 1 CAPSULE BY MOUTH UP TO TWICE A DAY FOR 1 WEEK, THEN REDUCE TO DAILY. 90 capsule 1   ondansetron  (ZOFRAN -ODT) 4 MG disintegrating tablet Take 1 tablet (4 mg total) by mouth every 8 (eight) hours as needed for nausea or vomiting. 20 tablet 0   SUMAtriptan  (IMITREX ) 50 MG tablet Take 1 tablet (50 mg total) by mouth every 2 (two) hours as needed for migraine (Max dose of 4 pills in 24 hours). Take first dose with 1 Naproxen . 20 tablet 2   terbinafine  (LAMISIL ) 250 MG tablet Take 1 tablet (250 mg total) by mouth daily. 90 tablet 0   traMADol  (ULTRAM ) 50 MG tablet Take 1 tablet (50 mg total) by mouth every 6 (six) hours as needed. 15 tablet 0   No current facility-administered medications for this visit.    Review of Systems  Constitutional: positive for fatigue Eyes: negative Ears, nose, mouth, throat, and face: negative Respiratory: positive for cough, dyspnea on exertion, and pleurisy/chest pain Cardiovascular: negative Gastrointestinal: negative Genitourinary:negative Integument/breast: negative Hematologic/lymphatic: negative Musculoskeletal:negative Neurological: negative Behavioral/Psych: negative Endocrine: negative Allergic/Immunologic:  negative  Physical Exam  MJO:jozmu, healthy, no distress, well nourished, well developed, and anxious SKIN: skin color, texture, turgor are normal, no rashes or significant lesions HEAD: Normocephalic, No masses, lesions, tenderness or abnormalities EYES: normal, PERRLA, Conjunctiva are pink and non-injected EARS: External ears normal, Canals clear OROPHARYNX:no exudate, no erythema, and lips, buccal mucosa, and tongue normal  NECK: supple, no adenopathy, no JVD LYMPH:  no palpable lymphadenopathy, no hepatosplenomegaly BREAST:not examined LUNGS: clear to auscultation , and palpation HEART: regular rate & rhythm, no murmurs, and no gallops ABDOMEN:abdomen soft, non-tender, normal bowel sounds, and no masses or organomegaly BACK: Back symmetric, no curvature., No CVA tenderness EXTREMITIES:no joint deformities, effusion, or inflammation, no edema  NEURO: alert & oriented x 3 with fluent speech, no focal motor/sensory deficits  PERFORMANCE STATUS: ECOG 1  LABORATORY DATA: Lab Results  Component Value Date   WBC 5.3 06/09/2024   HGB 13.5 06/09/2024   HCT 40.5 06/09/2024   MCV 85.8 06/09/2024   PLT 367 06/09/2024      Chemistry      Component Value Date/Time   NA 142 06/09/2024  1116   NA 144 07/16/2017 0857   K 4.0 06/09/2024 1116   CL 108 06/09/2024 1116   CO2 24 06/09/2024 1116   BUN 14 06/09/2024 1116   BUN 16 07/16/2017 0857   CREATININE 0.75 06/09/2024 1116      Component Value Date/Time   CALCIUM 10.4 (H) 06/09/2024 1116   ALKPHOS 111 06/09/2024 1116   AST 27 06/09/2024 1116   ALT 41 06/09/2024 1116   BILITOT 0.4 06/09/2024 1116       RADIOGRAPHIC STUDIES: CT ABDOMEN PELVIS W CONTRAST Result Date: 05/30/2024 EXAM: CT ABDOMEN AND PELVIS WITH CONTRAST 05/30/2024 03:30:00 PM TECHNIQUE: CT of the abdomen and pelvis was performed with the administration of 60 mL of iohexol  (OMNIPAQUE ) 350 MG/ML injection. Multiplanar reformatted images are provided for review.  Automated exposure control, iterative reconstruction, and/or weight-based adjustment of the mA/kV was utilized to reduce the radiation dose to as low as reasonably achievable. COMPARISON: 11/06/2013 CLINICAL HISTORY: Abdominal pain, acute, nonlocalized. FINDINGS: LOWER CHEST: 4.2 cm mass in the medial basal segment right lower lobe. mild scattered scarring and bronchiectasis in both lung bases. LIVER: Multiple low attenuation hepatic lesions, new since previous, largest lesion 2.5 cm in segment 8 near the dome. GALLBLADDER AND BILE DUCTS: Gallbladder is unremarkable. No biliary ductal dilatation. SPLEEN: No acute abnormality. PANCREAS: No acute abnormality. ADRENAL GLANDS: No acute abnormality. KIDNEYS, URETERS AND BLADDER: No stones in the kidneys or ureters. No hydronephrosis. No perinephric or periureteral stranding. Urinary bladder is unremarkable. GI AND BOWEL: Stomach demonstrates no acute abnormality. There is no bowel obstruction. Normal appendix. PERITONEUM AND RETROPERITONEUM: No ascites. No free air. VASCULATURE: Aorta is normal in caliber. Infrarenal IVC filter. Eccentric linear noncontiguous metallic or calcific density in the right common iliac vein and IVC inferior to the filter, stable since prior study. LYMPH NODES: No lymphadenopathy. REPRODUCTIVE ORGANS: No acute abnormality. BONES AND SOFT TISSUES: No acute osseous abnormality. No focal soft tissue abnormality. IMPRESSION: 1. New 4.2 cm right lower lobe lung mass, may represent primary or secondary neoplasm. 2. Multiple low attenuation hepatic lesions, new compared with 2015, largest 2.5 cm in segment 8, consistent with hepatic metastatic disease. Electronically signed by: Dayne Hassell MD 05/30/2024 04:19 PM EST RP Workstation: [id number]    ASSESSMENT: This is a very pleasant 64 years old African-American female with suspicious metastatic lung cancer but other malignancy could not be excluded at this point.  He presented with a right lower  lobe lung mass in addition to suspicious liver metastasis.  Further staging workup and tissue biopsy are still pending    PLAN: I had a lengthy discussion with the patient and her sister today about her current condition and further investigation to confirm her diagnosis.  Assessment and Plan Assessment & Plan Suspicious lung mass with liver lesions, possible metastatic cancer Imaging demonstrated a 4.2 cm mass in the medial right lower lobe and multiple hepatic lesions, the largest measuring 2.5 cm, highly suspicious for malignancy, most likely metastatic cancer. The primary site remains unconfirmed. Differential includes primary lung carcinoma with hepatic metastases versus another primary malignancy with secondary pulmonary and hepatic involvement. She is symptomatic with chest discomfort, cough, intermittent dyspnea, and fatigue, without weight loss. Further diagnostic evaluation is required to establish a definitive diagnosis and guide management. - Ordered ultrasound-guided core biopsy of a hepatic lesion for tissue diagnosis and determination of the primary malignancy. - Ordered PET scan for evaluation of additional metastatic sites and staging. - Ordered brain MRI to assess for intracranial  metastases. - Instructed her to monitor MyChart for scheduling and communication regarding diagnostic studies and appointments. - Planned to review diagnostic results and follow up in 2-3 weeks, or sooner if results are available, to discuss findings and management. She was advised to call immediately if she has any other concerning symptoms in the interval.  The patient voices understanding of current disease status and treatment options and is in agreement with the current care plan.  All questions were answered. The patient knows to call the clinic with any problems, questions or concerns. We can certainly see the patient much sooner if necessary.  Thank you so much for allowing me to participate in  the care of Bailey Patterson. I will continue to follow up the patient with you and assist in her care.   Disclaimer: This note was dictated with voice recognition software. Similar sounding words can inadvertently be transcribed and may not be corrected upon review.   Sherrod MARLA Sherrod June 09, 2024, 12:10 PM       [1]  Social History Tobacco Use   Smoking status: Never   Smokeless tobacco: Never  Vaping Use   Vaping status: Never Used  Substance Use Topics   Alcohol use: No   Drug use: No  [2]  Allergies Allergen Reactions   Blueberry [Vaccinium Angustifolium] Anaphylaxis   Grapeseed Extract [Nutritional Supplements] Anaphylaxis    Reaction to muscadines    Pineapple Anaphylaxis   Lactose Intolerance (Gi) Diarrhea   Lyrica [Pregabalin] Swelling    Body and throat swell   Oxycodone  Itching and Nausea And Vomiting   Topiramate     REACTION: confusion-pt thinks 100mg  made her confused   Hydrocodone -Acetaminophen  Rash and Hives   "

## 2024-06-10 ENCOUNTER — Encounter (HOSPITAL_COMMUNITY): Payer: Self-pay

## 2024-06-10 NOTE — Progress Notes (Signed)
 Bailey Patterson POUR, MD  Oluchi Pucci Approved for US  CORE BX of LIVER MASS - possible lung ca vs other mets.  Mod sedation  HKM       Previous Messages    ----- Message ----- From: Bailey [facility] Sent: 06/10/2024   9:12 AM EST To: Bailey [facility]; [facility] Subject: US  CORE BIOPSY (LIVER)                        Procedure : US  CORE BIOPSY (LIVER)  Reason : Ultrasound-guided core biopsy of one of the liver lesion Dx: Malignant neoplasm of unspecified part of unspecified bronchus or lung (HCC) [C34.90 (ICD-10-CM)]    History : CT abd pelv w/ contrast  Provider : Sherrod Sherrod, MD  Contact :  [phone]

## 2024-06-14 ENCOUNTER — Ambulatory Visit (HOSPITAL_COMMUNITY): Admission: RE | Admit: 2024-06-14

## 2024-06-22 ENCOUNTER — Ambulatory Visit (HOSPITAL_COMMUNITY)

## 2024-06-23 ENCOUNTER — Ambulatory Visit (HOSPITAL_COMMUNITY)

## 2024-06-24 ENCOUNTER — Other Ambulatory Visit (HOSPITAL_COMMUNITY)

## 2024-06-28 ENCOUNTER — Inpatient Hospital Stay

## 2024-06-28 ENCOUNTER — Inpatient Hospital Stay: Admitting: Physician Assistant

## 2025-01-19 ENCOUNTER — Encounter: Admitting: Family
